# Patient Record
Sex: Female | Born: 1995 | Race: Black or African American | Hispanic: No | Marital: Single
Health system: Midwestern US, Community
[De-identification: ages and names within clinical notes are randomized; demographics above are authoritative.]

## PROBLEM LIST (undated history)

## (undated) DIAGNOSIS — D509 Iron deficiency anemia, unspecified: Secondary | ICD-10-CM

## (undated) DIAGNOSIS — R7989 Other specified abnormal findings of blood chemistry: Secondary | ICD-10-CM

## (undated) DIAGNOSIS — Z789 Other specified health status: Secondary | ICD-10-CM

## (undated) DIAGNOSIS — F419 Anxiety disorder, unspecified: Secondary | ICD-10-CM

## (undated) DIAGNOSIS — F32A Depression, unspecified: Secondary | ICD-10-CM

## (undated) DIAGNOSIS — F6089 Other specific personality disorders: Secondary | ICD-10-CM

## (undated) HISTORY — DX: Other specified health status: Z78.9

## (undated) HISTORY — DX: Other specific personality disorders: F60.89

---

## 2015-06-28 DIAGNOSIS — F32A Depression, unspecified: Secondary | ICD-10-CM | POA: Insufficient documentation

## 2017-02-16 ENCOUNTER — Ambulatory Visit: Admit: 2017-02-16 | Discharge: 2017-02-16 | Payer: MEDICARE | Attending: Nurse Practitioner | Primary: Family Medicine

## 2017-02-16 DIAGNOSIS — K219 Gastro-esophageal reflux disease without esophagitis: Secondary | ICD-10-CM

## 2017-02-16 NOTE — Patient Instructions (Addendum)
Learning About Cutting Calories  How do calories affect your weight?  Food gives your body energy. Energy from the food you eat is measured in calories. This energy keeps your heart beating, your brain active, and your muscles working.  Your body needs a certain number of calories each day. After your body uses the calories it needs, it stores extra calories as fat.  To lose weight safely, you have to eat fewer calories while eating in a healthy way.  How many calories do you need each day?  The more active you are, the more calories you need. When you are less active, you need fewer calories. How many calories you need each day also depends on several things, including your age and whether you are female or female.  Here are some general guidelines for adults:  ?? Less active women and older adults need 1,600 to 2,000 calories each day.  ?? Active women and less active men need 2,000 to 2,400 calories each day.  ?? Active men need 2,400 to 3,000 calories each day.  How can you cut calories and eat healthy meals?  Whole grains, vegetables and fruits, and dried beans are good lower-calorie foods. They give you lots of nutrients and fiber. And they fill you up.  Sweets, energy drinks, and soda pop are high in calories. They give you few nutrients and no fiber. Try to limit soda pop, fruit juice, and energy drinks. Drink water instead.  Some fats can be part of a healthy diet. But cutting back on fats from highly processed foods like fast foods and many snack foods is a good way to lower the calories in your diet. Also, use smaller amounts of fats like butter, margarine, salad dressing, and mayonnaise. Add fresh garlic, lemon, or herbs to your meals to add flavor without adding fat.  Meats and dairy products can be a big source of hidden fats. Try to choose lean or low-fat versions of these products.  Fat-free cookies, candies, chips, and frozen treats can still be high in  sugar and calories. Some fat-free foods have more calories than regular ones. Eat fat-free treats in moderation, as you would other foods.  If your favorite foods are high in fat, salt, sugar, or calories, limit how often you eat them. Eat smaller servings, or look for healthy substitutes. Fill up on fruits, vegetables, and whole grains.  Eating at home  ?? Use meat as a side dish instead of as the main part of your meal.  ?? Try main dishes that use whole wheat pasta, brown rice, dried beans, or vegetables.  ?? Find ways to cook with little or no fat, such as broiling, steaming, or grilling.  ?? Use cooking spray instead of oil. If you use oil, use a monounsaturated oil, such as canola or olive oil.  ?? Trim fat from meats before you cook them.  ?? Drain off fat after you brown the meat or while you roast it.  ?? Chill soups and stews after you cook them. Then skim the fat off the top after it hardens.  Eating out  ?? Order foods that are broiled or poached rather than fried or breaded.  ?? Cut back on the amount of butter or margarine that you use on bread.  ?? Order sauces, gravies, and salad dressings on the side, and use only a little.  ?? When you order pasta, choose tomato sauce rather than cream sauce.  ?? Ask for salsa with your baked   potato instead of sour cream, butter, cheese, or bacon.  ?? Order meals in a small size instead of upgrading to a large.  ?? Share an entree, or take part of your food home to eat as another meal.  ?? Share appetizers and desserts.  Where can you learn more?  Go to http://www.healthwise.net/[facility]Help[employer]ctions.  Enter V914 in the search box to learn more about "Learning About Cutting Calories."  Current as of: June 15, 2016  Content Version: 11.8  ?? 2006-2018 [employer]. Care instructions adapted under license by [employer] (which disclaims liability or warranty for this information). If you have questions about a medical condition or  this instruction, always ask your healthcare professional. Healthwise, Incorporated disclaims any warranty or liability for your use of this information.

## 2017-02-16 NOTE — Progress Notes (Signed)
OFFICE NOTE    Cathy Contreras is a 21 y.o. female presenting today for office visit.   02/16/2017  4:58 PM    Chief Complaint   Patient presents with   ??? GERD     diagnosed 5 years ago   ??? Irregular Menses     HPI: Here today as a new patient, establishing care in office. Used to follow with Dr Lequita Halt but also with [facility]. She denies any recent labs. Has seen GYN in the past.    GERD: Diagnosed about 5 years ago. Uses OTC medications at times. Seems to get worse with weight gain. Has been changing diet including no fried foods. Does not seem to be improving much despite dietary changes.     Complains of heavy periods that have been happening for about 3 years since being off DepoProvera. She reports that before the Depo, she had heavy cycles but they were regular. Now, the last three years, they have been irregular and heavy. She reports that typically she has about two periods per month. She states that she will have light cycle initially and then heavy for about a week, then she will have about two weeks off before another one starts. Last month, she had three periods in one month. States that she saw GYN, Dr Emogene Morgan, in the past and told that she had ovarian cysts but told that there was nothing to be done. Last PAP, per patient, this year.     Would like to discuss weight management. Reports that she is eating about 1500 calories per day that she knows of when she was last counting- has not been counting lately because she feels that she eats healthy. Has been eating more vegetables and has cut out fried foods. She was going to the gym about three times per week but stopped a few weeks ago. She reports that no matter what she does, she can't seem to lose weight but does report weight loss in the past with going to the gym. She reports weight about 170 lbs when she was 21 years old and earlier this year was 230 lbs- now 270 lbs.     Review of Systems    Constitutional: Positive for unexpected weight change. Negative for chills, fatigue and fever.   Respiratory: Negative for cough, shortness of breath and wheezing.    Cardiovascular: Negative for chest pain, palpitations and leg swelling.   Gastrointestinal: Negative for abdominal pain, constipation, diarrhea, nausea and vomiting.        +indigestion   Genitourinary: Positive for menstrual problem. Negative for difficulty urinating, frequency, pelvic pain and vaginal discharge.   Musculoskeletal: Negative for arthralgias and myalgias.   Skin: Negative for rash.   Neurological: Negative for dizziness and headaches.       PHQ Screening   PHQ over the last two weeks 02/16/2017   Little interest or pleasure in doing things Not at all   Feeling down, depressed, irritable, or hopeless Not at all   Total Score PHQ 2 0       History  Past Medical History:   Diagnosis Date   ??? GERD (gastroesophageal reflux disease)        History reviewed. No pertinent surgical history.    Social History     Socioeconomic History   ??? Marital status: SINGLE     Spouse name: Not on file   ??? Number of children: Not on file   ??? Years of education: Not on file   ???  Highest education level: Not on file   Social Needs   ??? Financial resource strain: Not on file   ??? Food insecurity - worry: Not on file   ??? Food insecurity - inability: Not on file   ??? Transportation needs - medical: Not on file   ??? Transportation needs - non-medical: Not on file   Occupational History   ??? Not on file   Tobacco Use   ??? Smoking status: Never Smoker   ??? Smokeless tobacco: Never Used   Substance and Sexual Activity   ??? Alcohol use: Yes     Frequency: Monthly or less     Comment: occasional   ??? Drug use: No   ??? Sexual activity: Yes     Partners: Male     Birth control/protection: None   Other Topics Concern   ??? Not on file   Social History Narrative   ??? Not on file       Family History   Problem Relation Age of Onset   ??? Diabetes Mother    ??? Hypertension Father     ??? Cancer Paternal Grandmother [id number]        Colon cancer       No Known Allergies      No current daily medications      Advance Care Planning:   Patient was offered the opportunity to discuss advance care planning NO   Does patient have an Advance Directive:     If no, did you provide information on [facility]?         Patient Care Team:  Patient Care Team:  Birder RobsonLyle, Smriti Barkow D, NP as PCP - General (Nurse Practitioner)        LABS:  None new to review    RADIOLOGY:  None new to review      Physical Exam   Constitutional: She is oriented to person, place, and time. She appears well-developed and well-nourished. No distress.   Neck: Normal range of motion. Neck supple. No thyromegaly present.   Cardiovascular: Normal rate, regular rhythm and normal heart sounds.   No murmur heard.  Pulmonary/Chest: Effort normal and breath sounds normal. No respiratory distress.   Abdominal: Soft. Bowel sounds are normal. There is no tenderness.   Musculoskeletal: She exhibits no edema.   Neurological: She is alert and oriented to person, place, and time. She exhibits normal muscle tone. Coordination and gait normal.   Skin: Skin is warm and dry.         Vitals:    02/16/17 1650   BP: 124/69   Pulse: 78   Resp: 16   Temp: 97.8 ??F (36.6 ??C)   TempSrc: Oral   SpO2: 96%   Weight: 277 lb (125.6 kg)   Height: 5\' 8"  (1.727 m)   PainSc:   0 - No pain         Assessment and Plan    Gastroesophageal reflux disease, esophagitis presence not specified  *Did not get a chance to fully discuss today as conversation went more toward cycles and weight gain.     Menorrhagia with irregular cycle  *Check labs. Will attempt to get GYN records to determine what work up has been completed. Consider transvaginal US as well.   - CBC W/O DIFF; Future  - METABOLIC PANEL, COMPREHENSIVE; Future  - LIPID PANEL; Future  - TSH 3RD GENERATION; Future  - URINALYSIS W/MICROSCOPIC; Future     Weight gain/ Morbid obesity (HCC)   *Discussed  with patient about weight loss goals and tentative approach to goals discussed. Advised on calorie counting, reduction in calories, and how to calculate BMR. Recommend dietician consultation to promote healthy eating and get a dietary plan in place. Exercise encouraged- start with about 30 mins three days a week and then increase up over time- include both cardio and strength training. Consider formal program for exercise if budget permits. Recommend 5-10% weight loss prior to any initiation of pharmacological management. Follow up in office for weigh ins occasionally for accountability and when short term weight loss goal of 5-10% is achieved.   *I have reviewed/discussed the above normal BMI with the patient.  I have recommended the following interventions: dietary management education, guidance, and counseling, encourage exercise and monitor weight .   - CBC W/O DIFF; Future  - METABOLIC PANEL, COMPREHENSIVE; Future  - LIPID PANEL; Future  - TSH 3RD GENERATION; Future  - URINALYSIS W/MICROSCOPIC; Future    Encounter for immunization  *Given today in office. See LPN documentation.   - ADMIN INFLUENZA VIRUS VAC  - INFLUENZA VIRUS VAC QUAD,SPLIT,PRESV FREE SYRINGE IM          *Plan of care reviewed with patient. Patient in agreement with plan and expresses understanding. All questions answered and patient encouraged to call or RTO if further questions or concerns.       Follow-up Disposition:  Return in about 1 month (around 03/18/2017) for weight management and menorrhagia follow up- 30 mins..Marland Kitchen

## 2017-02-16 NOTE — Progress Notes (Signed)
Cathy Contreras is a 21 y.o. female who presents for routine immunizations.   She denies any symptoms , reactions or allergies that would exclude them from being immunized today.  Risks and adverse reactions were discussed and the VIS was given to them. All questions were addressed.  She was observed for 15 min post injection. There were no reactions observed.    Anda Kraftocelsey L Sanjit Mcmichael, LPN

## 2017-02-23 ENCOUNTER — Other Ambulatory Visit: Admit: 2017-02-23 | Payer: MEDICARE | Attending: Nurse Practitioner | Primary: Family Medicine

## 2017-02-23 DIAGNOSIS — N921 Excessive and frequent menstruation with irregular cycle: Secondary | ICD-10-CM

## 2017-02-23 DIAGNOSIS — Z0189 Encounter for other specified special examinations: Secondary | ICD-10-CM

## 2017-02-23 NOTE — Progress Notes (Signed)
Patient presents for lab draw ordered by:    Ordering Provider:  Mauri BrooklynJerrica Lyle NP   Ordering Department/Practice:  [facility]  Phone:  [phone]  Date Ordered:  02/16/2017    The following labs were drawn and [facility] by Anda Kraftocelsey L Anik Wesch, LPN:    CBC, Lipid Profile, CMP and TSH, 3rd Generation    The following tubes were sent:    Lavender  ( 1)    Gel  1    Pt in for lab visit. Venipuncture done in left arm. Unable to obtain blood specimen. Venipuncture done in right arn, blood specimen obtained. Pt tolerated well. No comps. Pt unable to void at this time to obtain urine specimen.

## 2017-02-24 ENCOUNTER — Inpatient Hospital Stay: Admit: 2017-02-24 | Payer: MEDICARE | Primary: Family Medicine

## 2017-02-24 LAB — CBC W/O DIFF
HCT: 39.7 % (ref 35.0–45.0)
HGB: 11.8 g/dL — ABNORMAL LOW (ref 12.0–16.0)
MCH: 24.5 PG (ref 24.0–34.0)
MCHC: 29.7 g/dL — ABNORMAL LOW (ref 31.0–37.0)
MCV: 82.4 FL (ref 74.0–97.0)
MPV: 11 FL (ref 9.2–11.8)
PLATELET: 362 10*3/uL (ref 135–420)
RBC: 4.82 M/uL (ref 4.20–5.30)
RDW: 16.1 % — ABNORMAL HIGH (ref 11.6–14.5)
WBC: 6.5 10*3/uL (ref 4.6–13.2)

## 2017-02-24 LAB — METABOLIC PANEL, COMPREHENSIVE
A-G Ratio: 0.9 (ref 0.8–1.7)
ALT (SGPT): 41 U/L (ref 13–56)
AST (SGOT): 30 U/L (ref 15–37)
Albumin: 3.7 g/dL (ref 3.4–5.0)
Alk. phosphatase: 149 U/L — ABNORMAL HIGH (ref 45–117)
Anion gap: 8 mmol/L (ref 3.0–18)
BUN/Creatinine ratio: 10 — ABNORMAL LOW (ref 12–20)
BUN: 7 MG/DL (ref 7.0–18)
Bilirubin, total: 0.2 MG/DL (ref 0.2–1.0)
CO2: 23 mmol/L (ref 21–32)
Calcium: 9.5 MG/DL (ref 8.5–10.1)
Chloride: 105 mmol/L (ref 100–108)
Creatinine: 0.72 MG/DL (ref 0.6–1.3)
GFR est AA: >60 mL/min/{1.73_m2} (ref >60)
GFR est non-AA: >60 mL/min/{1.73_m2} (ref >60)
Globulin: 3.9 g/dL (ref 2.0–4.0)
Glucose: 99 mg/dL (ref 74–99)
Potassium: 4.2 mmol/L (ref 3.5–5.5)
Protein, total: 7.6 g/dL (ref 6.4–8.2)
Sodium: 136 mmol/L (ref 136–145)

## 2017-02-24 LAB — LIPID PANEL
CHOL/HDL Ratio: 4.9 (ref 0–5.0)
Cholesterol, total: 157 MG/DL (ref <200)
HDL Cholesterol: 32 MG/DL — ABNORMAL LOW (ref 40–60)
LDL, calculated: 92 MG/DL (ref 0–100)
Triglyceride: 165 MG/DL — ABNORMAL HIGH (ref <150)
VLDL, calculated: 33 MG/DL

## 2017-02-24 LAB — TSH 3RD GENERATION: TSH: 4.7 u[IU]/mL — ABNORMAL HIGH (ref 0.36–3.74)

## 2017-02-27 ENCOUNTER — Encounter

## 2017-02-27 NOTE — Telephone Encounter (Signed)
Return call made x's 2 to [facility] to speak to [place]David. He was out of the office and now gone for today. Will call back tomorrow.

## 2017-02-27 NOTE — Progress Notes (Signed)
02/27/2017  11:41 AM      Noted elevated TSH. Add on Free T4 ordered at this time- [facility] at [phone]

## 2017-02-27 NOTE — Telephone Encounter (Signed)
[facility] called [facility] this patient stating the 3T4 had a 48 stability, could not be processed

## 2017-02-28 NOTE — Telephone Encounter (Signed)
Call made to Onalee Huaavid at [facility] to get clarification on message regarding the pt's labs. Not able to add the test Free T 4, Lab not stable after 48 hrs. Will make the provider aware.

## 2017-03-01 NOTE — Telephone Encounter (Signed)
03/01/2017  8:43 AM    Chief Complaint   Patient presents with   ??? Abnormal Lab Results       Noted that unable to run Free T4 off last labs. Will discuss further with patient about doing before or at next visit.

## 2017-03-01 NOTE — Telephone Encounter (Signed)
03/01/2017  9:36 AM    Chief Complaint   Patient presents with   ??? Results     lab results, elevated TSH and mild anemia       Noted lab results with mildly elevated TSH and mild anemia. Attempted to add on T4 but no longer stable. MyCHart message sent to patient with results, as well as request to do further labs before or at next visit.       Results for orders placed or performed during the hospital encounter of 02/23/17   CBC W/O DIFF   Result Value Ref Range    WBC 6.5 4.6 - 13.2 K/uL    RBC 4.82 4.20 - 5.30 M/uL    HGB 11.8 (L) 12.0 - 16.0 g/dL    HCT 39.7 35.0 - 45.0 %    MCV 82.4 74.0 - 97.0 FL    MCH 24.5 24.0 - 34.0 PG    MCHC 29.7 (L) 31.0 - 37.0 g/dL    RDW 16.1 (H) 11.6 - 14.5 %    PLATELET 362 135 - 420 K/uL    MPV 11.0 9.2 - 29.5 FL   METABOLIC PANEL, COMPREHENSIVE   Result Value Ref Range    Sodium 136 136 - 145 mmol/L    Potassium 4.2 3.5 - 5.5 mmol/L    Chloride 105 100 - 108 mmol/L    CO2 23 21 - 32 mmol/L    Anion gap 8 3.0 - 18 mmol/L    Glucose 99 74 - 99 mg/dL    BUN 7 7.0 - 18 MG/DL    Creatinine 0.72 0.6 - 1.3 MG/DL    BUN/Creatinine ratio 10 (L) 12 - 20      GFR est AA >60 >60 ml/min/1.65m    GFR est non-AA >60 >60 ml/min/1.770m   Calcium 9.5 8.5 - 10.1 MG/DL    Bilirubin, total 0.2 0.2 - 1.0 MG/DL    ALT (SGPT) 41 13 - 56 U/L    AST (SGOT) 30 15 - 37 U/L    Alk. phosphatase 149 (H) 45 - 117 U/L    Protein, total 7.6 6.4 - 8.2 g/dL    Albumin 3.7 3.4 - 5.0 g/dL    Globulin 3.9 2.0 - 4.0 g/dL    A-G Ratio 0.9 0.8 - 1.7     LIPID PANEL   Result Value Ref Range    LIPID PROFILE          Cholesterol, total 157 <200 MG/DL    Triglyceride 165 (H) <150 MG/DL    HDL Cholesterol 32 (L) 40 - 60 MG/DL    LDL, calculated 92 0 - 100 MG/DL    VLDL, calculated 33 MG/DL    CHOL/HDL Ratio 4.9 0 - 5.0     TSH 3RD GENERATION   Result Value Ref Range    TSH 4.70 (H) 0.36 - 3.74 uIU/mL

## 2017-03-16 ENCOUNTER — Ambulatory Visit: Admit: 2017-03-16 | Discharge: 2017-03-16 | Payer: MEDICARE | Attending: Nurse Practitioner | Primary: Family Medicine

## 2017-03-16 DIAGNOSIS — N921 Excessive and frequent menstruation with irregular cycle: Secondary | ICD-10-CM

## 2017-03-16 NOTE — Progress Notes (Signed)
03/16/2017  5:10 PM      This is an Initial Medicare Annual Wellness Exam (AWV) (Performed 12 months after IPPE or effective date of Medicare Part B enrollment, Once in a lifetime)    I have reviewed the patient's medical history in detail and updated the computerized patient record.     History     Past Medical History:   Diagnosis Date   ??? GERD (gastroesophageal reflux disease)       History reviewed. No pertinent surgical history.    No Known Allergies  Family History   Problem Relation Age of Onset   ??? Diabetes Mother    ??? Hypertension Father    ??? Cancer Paternal Grandmother [id number]        Colon cancer     Social History     Tobacco Use   ??? Smoking status: Never Smoker   ??? Smokeless tobacco: Never Used   Substance Use Topics   ??? Alcohol use: Yes     Frequency: Monthly or less     Comment: occasional     Patient Active Problem List   Diagnosis Code   ??? Obesity, morbid (HCC) E66.01         Depression Risk Factor Screening:     PHQ over the last two weeks 02/16/2017   Little interest or pleasure in doing things Not at all   Feeling down, depressed, irritable, or hopeless Not at all   Total Score PHQ 2 0       Alcohol Risk Factor Screening:   You do not drink alcohol or very rarely.    Functional Ability and Level of Safety:     Hearing Loss   No hearing loss noted    Activities of Daily Living   Self-care.   Requires assistance with: no ADLs    Fall Risk   No flowsheet data found.    Abuse Screen   Patient is not abused  Denies financial, physical, or verbal abuse    Review of Systems   Constitutional: negative for fevers, chills and weight loss  Respiratory: negative for cough, wheezing or dyspnea on exertion  Cardiovascular: negative for chest pain, palpitations  Gastrointestinal: negative for abdominal pain, N/V/D  Musculoskeletal:negative for myalgias and muscle weakness    Labwork/Imaging     Lab Results   Component Value Date/Time    Glucose 99 02/23/2017 09:32 AM       Lab Results   Component Value Date/Time     Cholesterol, total 157 02/23/2017 09:32 AM    HDL Cholesterol 32 (L) 02/23/2017 09:32 AM    LDL, calculated 92 02/23/2017 09:32 AM    VLDL, calculated 33 02/23/2017 09:32 AM    Triglyceride 165 (H) 02/23/2017 09:32 AM    CHOL/HDL Ratio 4.9 02/23/2017 09:32 AM         Physical Examination     Evaluation of Cognitive Function:  Mood/affect:  neutral  Appearance: age appropriate, casually dressed and within normal Limits  Family member/caregiver input: none present    Blood pressure 133/84, pulse 75, temperature 97.5 ??F (36.4 ??C), temperature source Oral, resp. rate 16, height 5\' 8"  (1.727 m), weight 282 lb (127.9 kg), SpO2 96 %.  Body mass index is 42.88 kg/m??.    General appearance: alert, cooperative, no distress  Lungs: clear to auscultation bilaterally  Heart: regular rate and rhythm, S1, S2 normal, no murmur, click, rub or gallop  Abdomen: soft, non-tender. Bowel sounds normal. No masses,  no organomegaly  Extremities: extremities normal, atraumatic, no cyanosis or edema    Patient Care Team:  Birder RobsonLyle, Theressa Piedra D, NP as PCP - General (Nurse Practitioner)    Advice/Referrals/Counseling   Education and counseling provided:  Are appropriate based on today's review and evaluation  Screening Pap and pelvic (covered once every 2 years)  Cardiovascular screening blood test  Diabetes screening test    Assessment/Plan   Diagnoses and all orders for this visit:    Encounter for Medicare annual wellness exam  *Medicare wellness completed. Education and counseling provided, recommendations made- see Medicare chart in patient instructions and see below.   -Screening for depression  -Screening for alcoholism    Obesity, morbid (HCC)  *I have reviewed/discussed the above normal BMI with the patient.  I have recommended the following interventions: dietary management education, guidance, and counseling, encourage exercise and monitor weight .     Healthcare maintenance   *Will attempt to get last PAP records from GYN. Diabetes and cholesterol screening completed recently. Flu shot given this year.       *Plan of care reviewed with patient. Patient in agreement with plan and expresses understanding. All questions answered and patient encouraged to call or RTO if further questions or concerns.       Follow-up Disposition:  Return in about 2 months (around 05/17/2017) for weight management follow up- 30 mins.Edd Arbour, Yearly Medicare Wellness- [phone] done today.Marland Kitchen..Marland Kitchen

## 2017-03-16 NOTE — Progress Notes (Signed)
OFFICE NOTE    Cathy Contreras is a 21 y.o. female presenting today for office visit.   03/16/2017   5:00 PM      Chief Complaint   Patient presents with   ??? Weight Management   ??? Menstrual Problem     HPI: Here today for follow up on complaints and weight management, new patient at last visit. Medicare Wellness due (see other note). Labs done recently and here to review as well. Used to follow with Dr Lilia Pro but also with [facility]- no records available at this time. Has seen GYN in the past- no records available at this time.    At last visit, heavy cycles were discussed. Complained of heavy periods that have been happening for about 3 years since being off DepoProvera. She reports that before the Depo, she had heavy cycles but they were regular. Now, the last three years, they have been irregular and heavy. She reports that typically she has about two periods per month. She states that she will have light cycle initially and then heavy for about a week, then she will have about two weeks off before another one starts. Last month, she had three periods in one month. States that she saw GYN, Dr Aviva Signs, in the past and told that she had ovarian cysts but told that there was nothing to be done.     Also discussed weight management at last visit. She has gained weight since last seen about a month ago- she attributes to the holidays. Plans on getting back to making lifestyle changes with the New Year and she got a treadmill recently. Previously, she reported eating about 1500 calories per day when counting but admitted that she did not count often because she felt that she was eating healthy. Had been going to the gym but stopped. 170 lbs when she was 21 years old and earlier this year was 230 lbs- now 270/280 lbs. She reports she just feels tired all the time.     Review of Systems   Constitutional: Positive for fatigue. Negative for chills and fever.    Gastrointestinal: Negative for abdominal pain, blood in stool, constipation, diarrhea, nausea and vomiting.   Genitourinary: Positive for menstrual problem. Negative for dysuria, pelvic pain, vaginal discharge and vaginal pain.       PHQ Screening   PHQ over the last two weeks 02/16/2017   Little interest or pleasure in doing things Not at all   Feeling down, depressed, irritable, or hopeless Not at all   Total Score PHQ 2 0       History  Past Medical History:   Diagnosis Date   ??? GERD (gastroesophageal reflux disease)        History reviewed. No pertinent surgical history.    Social History     Socioeconomic History   ??? Marital status: SINGLE     Spouse name: Not on file   ??? Number of children: Not on file   ??? Years of education: Not on file   ??? Highest education level: Not on file   Social Needs   ??? Financial resource strain: Not on file   ??? Food insecurity - worry: Not on file   ??? Food insecurity - inability: Not on file   ??? Transportation needs - medical: Not on file   ??? Transportation needs - non-medical: Not on file   Occupational History   ??? Not on file   Tobacco Use   ??? Smoking  status: Never Smoker   ??? Smokeless tobacco: Never Used   Substance and Sexual Activity   ??? Alcohol use: Yes     Frequency: Monthly or less     Comment: occasional   ??? Drug use: No   ??? Sexual activity: Yes     Partners: Male     Birth control/protection: None   Other Topics Concern   ??? Not on file   Social History Narrative   ??? Not on file       Family History   Problem Relation Age of Onset   ??? Diabetes Mother    ??? Hypertension Father    ??? Cancer Paternal Grandmother 58        Colon cancer       No Known Allergies      No current daily medications      Advance Care Planning:   Patient was offered the opportunity to discuss advance care planning NO   Does patient have an Advance Directive:     If no, did you provide information on Caring Connections?         Patient Care Team:  Patient Care Team:   Juliann Pares, NP as PCP - General (Nurse Practitioner)        LABS:    Results for orders placed or performed during the hospital encounter of 02/23/17   CBC W/O DIFF   Result Value Ref Range    WBC 6.5 4.6 - 13.2 K/uL    RBC 4.82 4.20 - 5.30 M/uL    HGB 11.8 (L) 12.0 - 16.0 g/dL    HCT 39.7 35.0 - 45.0 %    MCV 82.4 74.0 - 97.0 FL    MCH 24.5 24.0 - 34.0 PG    MCHC 29.7 (L) 31.0 - 37.0 g/dL    RDW 16.1 (H) 11.6 - 14.5 %    PLATELET 362 135 - 420 K/uL    MPV 11.0 9.2 - 63.0 FL   METABOLIC PANEL, COMPREHENSIVE   Result Value Ref Range    Sodium 136 136 - 145 mmol/L    Potassium 4.2 3.5 - 5.5 mmol/L    Chloride 105 100 - 108 mmol/L    CO2 23 21 - 32 mmol/L    Anion gap 8 3.0 - 18 mmol/L    Glucose 99 74 - 99 mg/dL    BUN 7 7.0 - 18 MG/DL    Creatinine 0.72 0.6 - 1.3 MG/DL    BUN/Creatinine ratio 10 (L) 12 - 20      GFR est AA >60 >60 ml/min/1.4m    GFR est non-AA >60 >60 ml/min/1.771m   Calcium 9.5 8.5 - 10.1 MG/DL    Bilirubin, total 0.2 0.2 - 1.0 MG/DL    ALT (SGPT) 41 13 - 56 U/L    AST (SGOT) 30 15 - 37 U/L    Alk. phosphatase 149 (H) 45 - 117 U/L    Protein, total 7.6 6.4 - 8.2 g/dL    Albumin 3.7 3.4 - 5.0 g/dL    Globulin 3.9 2.0 - 4.0 g/dL    A-G Ratio 0.9 0.8 - 1.7     LIPID PANEL   Result Value Ref Range    LIPID PROFILE          Cholesterol, total 157 <200 MG/DL    Triglyceride 165 (H) <150 MG/DL    HDL Cholesterol 32 (L) 40 - 60 MG/DL    LDL, calculated 92 0 - 100  MG/DL    VLDL, calculated 33 MG/DL    CHOL/HDL Ratio 4.9 0 - 5.0     TSH 3RD GENERATION   Result Value Ref Range    TSH 4.70 (H) 0.36 - 3.74 uIU/mL       RADIOLOGY:  None new to review      Physical Exam   Constitutional: She is oriented to person, place, and time. She appears well-developed and well-nourished. No distress.   Pulmonary/Chest: Effort normal. No respiratory distress.   Neurological: She is alert and oriented to person, place, and time. She exhibits normal muscle tone. Coordination normal.   Skin: Skin is warm and dry.    Psychiatric: Her speech is normal and behavior is normal. Her mood appears not anxious. She does not exhibit a depressed mood.         Vitals:    03/16/17 1653   BP: 133/84   Pulse: 75   Resp: 16   Temp: 97.5 ??F (36.4 ??C)   TempSrc: Oral   SpO2: 96%   Weight: 282 lb (127.9 kg)   Height: 5' 8"  (1.727 m)   PainSc:   0 - No pain       Wt Readings from Last 3 Encounters:   03/16/17 282 lb (127.9 kg)   02/16/17 277 lb (125.6 kg)       Assessment and Plan    Menorrhagia with irregular cycle  *No office notes received from GYN- will attempt again to determine past work up. Also advised patient that a follow up with their office may be of benefit. Mild anemia noted of Hgb 11.8- will check iron levels and vitamin levels. Consider iron supplement. Advised on OCP or other management and she declines at this time. She also declines further work up such as transvaginal US at this time. She reports that overall, it is not that bothersome.     Anemia, unspecified type  *Check further labs  - IRON PROFILE; Future  - FERRITIN; Future  - FOLATE; Future  - VITAMIN B12; Future  - CBC W/O DIFF; Future    Obesity, morbid ([place])  *She has not been working on lifestyle changes. Attempted and encouraged motivation to do so and promote weight loss.    Abnormal serum thyroid stimulating hormone (TSH) level  *Check further, noted on labs.  - TSH 3RD GENERATION; Future  - T4, FREE; Future    Fatigue, unspecified type/ Other general symptoms and signs/ Vitamin D deficiency  *Check further labs. Consider Vit D deficiency.   - IRON PROFILE; Future  - FERRITIN; Future  - FOLATE; Future  - VITAMIN B12; Future  - TSH 3RD GENERATION; Future  - T4, FREE; Future  - VITAMIN D, 1, 25 DIHYDROXY; Future  - CBC W/O DIFF; Future      *Plan of care reviewed with patient. Patient in agreement with plan and expresses understanding. All questions answered and patient encouraged to call or RTO if further questions or concerns.       Follow-up Disposition:   Return in about 2 months (around 05/17/2017) for weight management follow up- 30 mins.Freda Munro Medicare Wellness- [phone] done today.Marland Kitchen

## 2017-03-16 NOTE — Patient Instructions (Addendum)
Preventive Services Limitations Recommendation Preventative Services Limitations Recommendation   Glaucoma Screening (no USPSTF recommendation) Diabetes mellitus, family history, African American, age 23 or over, Hispanic American, age 23 or over Follows with eye doctor Periodontal Disease- Dentistry Services *May not be covered under Medicare Recommended   Skin Cancer Screening Exam every year  *May not be covered under Medicare   Self checks encouraged Hearing Impairment Pure Tone Test every 3 years  *May not be covered under Medicare No hearing loss noted   COPD and Lung Cancer Screening CT chest or chest xray yearly as deemed necessary  *May not be covered under Medicare Nonsmoker Counseling to Prevent Tobacco Use  - Counseling greater than 3 and up to 10 minutes  - Counseling greater than 10 minutes Patients must be asymptomatic of tobacco-related conditions to receive as preventive service    Up to 8 sessions/year Nonsmoker   Alcohol Misuse Counseling 4 brief face to face counseling sessions/year Not indicated Obesity Screening and Counseling BMI of 30 or more. Weekly visits for first month, then biweekly for months 2-6, then every month for months 7-12 if you lose 6.6 lbs in months 1-6 Body mass index is 42.88 kg/m??.     Screening for STIs and Counseling Annually screenings- 2 face to face counseling sessions/year Follows with GYN    Can be done in future if desired Human Immunodeficiency Virus (HIV) Screening (annually for increased risk patients)  HIV-1 and HIV-2 by EIA, ELISA, rapid antibody test, or oral mucosa transudate Tests covered annually for patients at increased risk.  Pregnant patients may receive up to 3 test during pregnancy. Follows with GYN    Can be done in future if desired   Diabetes Screening Tests (at least every 3 years, Medicare covers annually or at 5015-month intervals for prediabetic patients)    Fasting blood sugar (FBS) or glucose tolerance test (GTT) Diagnosed with  one of the following:  -Hypertension, Dyslipidemia, obesity, previous impaired FBS or GTT  ???Or any two of the following: overweight, FH of diabetes, age ?65, history of gestational diabetes, birth of baby weighing more than 9 pounds Glu 99 02/2017 Cardiovascular Screening Blood Tests (every 5 years)  Total cholesterol, HDL, Triglycerides Order as a panel if possible LDL 92 02/2017    Controlled   Medical Nutrition Therapy (MNT) (for diabetes or renal disease not recommended schedule) Requires referral by treating physician for patient with diabetes or renal disease. Up to 3 hours for initial year and 2 hours in subsequent years. Not indicate Diabetes Self-Management Training (DSMT) (no USPSTF recommendation) Requires referral by treating physician for patient with diabetes or renal disease. 10 hours of initial DSMT session of no less than 30 minutes each in a continuous 533-month period.  2 hours of follow-up DSMT in subsequent years. Not indicated   Behavioral Therapy for Cardiovascular Disease Annually Not indicated due to age Ultrasound Screening for Abdominal Aortic Aneurysm (AAA) (once) Patient must be referred through IPPE. Criteria:  - Men who are 7765-10896 years old and smoked >100 cigarettes.  -Anyone with FH of AAA  -Or recommended for screening by USPSTF N/A   Prostate Cancer Screening (annually up to age 23)  - Digital rectal exam (DRE)  - Prostate specific antigen (PSA) Annually (age 23 or over), DRE not paid separately when covered E/M service is provided on same date N/A Colorectal Cancer Screening -Fecal occult blood test (annual)  -Flexible sigmoidoscopy (5y)  -Screening colonoscopy (10y)  -Barium Enema Not indicated for early screening  Bone Mass Measurement  (age 23 & older, biennial) Requires diagnosis related to osteoporosis or estrogen deficiency. History of long-term glucocorticoid tx or baseline is needed. Not indicated for early screening Screening Mammography (biennial  age 23-74) Annually (age 23 or over) Not indicated for early screening   Screening Pap Tests and Pelvic Examination (up to age 23 and after 6370 if unknown history or abnormal study last 10 years) Every 24 months except high risk Recommend to follow up with GYN Hepatitis B Vaccinations (if medium/high risk) Medium/high risk factors:  End-stage renal disease,  Hemophiliacs who received Factor VIII or IX concentrates, Clients of institutions for the mentally retarded, Persons who live in the same house as a HepB virus carrier, Homosexual men, Illicit injectable drug abusers. Not indicated   Seasonal Influenza Vaccination (annually)  Given this year Pneumococcal Vaccination (once after 65)  Not indicated for early vaccination   Herpes Zoster (Shingles) Vaccination (once after age 23) Administer to persons at age 23 years in specific conditions  *May not be covered by Medicare Not indicated for early vaccination Tetanus Vaccine Td booster every 10 years- Tdap if exposed to children under 1 year)  *May not be covered by Medicare   No medical indication today    Unsure of last booster       Dental    [facility]- also has several [place] locations  [address], [place], [place]  Phone: [phone]    [facility]  [address], [place], [place] [id number]  Phone: [phone]    [facility]- also has [place], and [place] locations  [address] [place], [place]  Phone: [phone]    [facility]  [address], [place], [place]  Phone: [phone]    [facility]  [address] [place], [place]  Phone: [phone]    [facility]- also has [facility] [place] [facility]  [address] [place], [place], [place]  Phone: [phone]  &  [address], [address], [place], [place]  Phone: [phone]    [facility]   [address], [place], [place] [address]  [phone]    [facility]  [address], [place], [place] [address]  [phone]    Leanne ChangAlan Taylor D.D.Kathie RhodesS  [address], [place], [place] [address]  [phone]    Dr. Chuck HintJana Boyd, DDS  [address], [place], [place] [address]  [phone]    Dr. Kandace Parkinsonovan Caves  [address], [place], [place] [address]  [phone]    [facility]  [address], [place], [place] [address]  [phone]    Here are some dental clinics in the area that offer discounted care:    [facility]  [address] [place], [place]  Phone: [phone]    [facility]  [address] [place], [place]  Phone: [phone]    [facility]  [address] [place], [place]  Phone: [phone]    [facility]  [address], [place], [place]  Phone: [phone]    [facility]  [address], [place], [place]  Phone: [phone]    `

## 2017-03-22 ENCOUNTER — Encounter: Admit: 2017-03-22 | Payer: MEDICARE | Attending: Nurse Practitioner | Primary: Family Medicine

## 2017-03-23 ENCOUNTER — Encounter: Attending: Nurse Practitioner | Primary: Family Medicine

## 2017-03-23 ENCOUNTER — Other Ambulatory Visit: Admit: 2017-03-23 | Discharge: 2017-03-23 | Payer: MEDICARE | Attending: Nurse Practitioner | Primary: Family Medicine

## 2017-03-23 DIAGNOSIS — Z0189 Encounter for other specified special examinations: Secondary | ICD-10-CM

## 2017-03-23 DIAGNOSIS — D649 Anemia, unspecified: Secondary | ICD-10-CM

## 2017-03-23 NOTE — Progress Notes (Signed)
Patient presents for lab draw ordered by:    Ordering Provider:  Mauri BrooklynJerrica Lyle NP  Ordering Department/Practice:  [facility]  Phone:  [phone]  Date Ordered:  03/16/2017    The following labs were drawn and [facility] to [facility] by Anda Kraftocelsey L Odeth Bry, LPN:   UJW,JXB,1YNCBC,TSH,3rd Generation,  , Iron Profile,Ferritin Folate,Vit B12,T4 Free,Vit D    The following tubes were sent:    Lavender  ( 1)     Gel   4    Pt in for lab visit. Venipuncture done in right arm x 2. Blood specimen obtained. Pt tolerated well. No comps.

## 2017-03-24 ENCOUNTER — Inpatient Hospital Stay: Admit: 2017-03-24 | Payer: MEDICARE | Primary: Family Medicine

## 2017-03-24 LAB — CBC W/O DIFF
HCT: 40.6 % (ref 35.0–45.0)
HGB: 12.6 g/dL (ref 12.0–16.0)
MCH: 25 PG (ref 24.0–34.0)
MCHC: 31 g/dL (ref 31.0–37.0)
MCV: 80.6 FL (ref 74.0–97.0)
MPV: 10.2 FL (ref 9.2–11.8)
PLATELET: 349 10*3/uL (ref 135–420)
RBC: 5.04 M/uL (ref 4.20–5.30)
RDW: 16.7 % — ABNORMAL HIGH (ref 11.6–14.5)
WBC: 6.5 10*3/uL (ref 4.6–13.2)

## 2017-03-24 LAB — VITAMIN B12: Vitamin B12: 774 pg/mL (ref 211–911)

## 2017-03-24 LAB — IRON PROFILE
Iron % saturation: 8 %
Iron: 34 ug/dL — ABNORMAL LOW (ref 50–175)
TIBC: 441 ug/dL (ref 250–450)

## 2017-03-24 LAB — FOLATE: Folate: >20 ng/mL — ABNORMAL HIGH (ref 3.10–17.50)

## 2017-03-24 LAB — T4, FREE: T4, Free: 1.1 NG/DL (ref 0.7–1.5)

## 2017-03-24 LAB — TSH 3RD GENERATION: TSH: 3 u[IU]/mL (ref 0.36–3.74)

## 2017-03-24 LAB — FERRITIN: Ferritin: 18 NG/ML (ref 8–388)

## 2017-03-26 LAB — VITAMIN D, 1, 25 DIHYDROXY: Calcitriol (Vit D 1, 25 di-OH): 37.2 pg/mL (ref 19.9–79.3)

## 2017-05-17 ENCOUNTER — Encounter: Attending: Nurse Practitioner | Primary: Family Medicine

## 2018-03-26 ENCOUNTER — Encounter: Attending: Family | Primary: Nurse Practitioner

## 2018-03-26 NOTE — Progress Notes (Deleted)
Last visit 02/2017- [id number] done then and addressed heavy cycle, weight management.

## 2018-08-23 NOTE — Telephone Encounter (Signed)
Patient called and advised she has been around someone that tested positive for COVID-19.  Patient advised she has no real symptoms other than a cough that is normal for her at this time of year.  Advised paitent to quarantine herself for 14 days and watch for any other symptoms such as fever, SOB, fatigue, dry cough etc- instructed if she notices any of these symptoms to notify us and we can refer her to the [facility].  Patient was concerned because she was around her 68 month old niece.  Advised her to talk to babies pediatrician for any concerns directed towards baby.  Patient verbalized understanding

## 2019-02-18 ENCOUNTER — Ambulatory Visit: Payer: MEDICAID | Attending: Nurse Practitioner | Primary: Nurse Practitioner

## 2019-07-02 ENCOUNTER — Ambulatory Visit: Attending: Nurse Practitioner | Primary: Family Medicine

## 2019-07-02 ENCOUNTER — Ambulatory Visit
Admit: 2019-07-02 | Discharge: 2019-07-02 | Payer: MEDICARE | Attending: Nurse Practitioner | Primary: Nurse Practitioner

## 2019-07-02 DIAGNOSIS — Z0000 Encounter for general adult medical examination without abnormal findings: Secondary | ICD-10-CM

## 2019-07-02 LAB — AMB POC URINE PREGNANCY TEST, VISUAL COLOR COMPARISON
HCG urine, Ql. (POC): POSITIVE
HCG, Pregnancy, Urine, POC: POSITIVE

## 2019-07-02 NOTE — ACP (Advance Care Planning) (Signed)
Advance Care Planning     Advance Care Planning (ACP) Physician/NP/PA Conversation      Date of Conversation: 07/02/2019  Conducted with: Patient with Decision Making Capacity    Healthcare Decision Maker:     Click here to complete HealthCare Decision Makers including selection of the Healthcare Decision Maker Relationship (ie "Primary")  Today we documented Decision Maker(s) consistent with Legal Next of Kin hierarchy.    Care Preferences:    Hospitalization: "If your health worsens and it becomes clear that your chance of recovery is unlikely, what would be your preference regarding hospitalization?"  The patient would prefer comfort-focused treatment without hospitalization.    Ventilation: "If you were unable to breathe on your own and your chance of recovery was unlikely, what would be your preference about the use of a ventilator (breathing machine) if it was available to you?"   The patient is unsure.    Resuscitation: "In the event your heart stopped as a result of an underlying serious health condition, would you want attempts to be made to restart your heart, or would you prefer a natural death?"   Yes, attempt to resuscitate.    Additional topics discussed: treatment goals    Conversation Outcomes / Follow-Up Plan:   ACP complete - no further action today  Reviewed DNR/DNI and patient elects Full Code (Attempt Resuscitation)     Length of Voluntary ACP Conversation in minutes:  <16 minutes (Non-Billable)    Lillia Dallas, NP

## 2019-07-02 NOTE — Progress Notes (Signed)
 Cathy Contreras presents today for   Chief Complaint   Patient presents with   . Menstrual Problem     positive home pregnancy tests       Is someone accompanying this pt? no    Is the patient using any DME equipment during OV? no    Depression Screening:  3 most recent PHQ Screens 07/02/2019   Little interest or pleasure in doing things Not at all   Feeling down, depressed, irritable, or hopeless Not at all   Total Score PHQ 2 0   Trouble falling or staying asleep, or sleeping too much Not at all   Feeling tired or having little energy Several days   Poor appetite, weight loss, or overeating Several days   Feeling bad about yourself - or that you are a failure or have let yourself or your family down Not at all   Trouble concentrating on things such as school, work, reading, or watching TV Not at all   Moving or speaking so slowly that other people could have noticed; or the opposite being so fidgety that others notice Not at all   Thoughts of being better off dead, or hurting yourself in some way Not at all   PHQ 9 Score 2   How difficult have these problems made it for you to do your work, take care of your home and get along with others Not difficult at all       Learning Assessment:  No flowsheet data found.    Abuse Screening:  No flowsheet data found.    Fall Risk  No flowsheet data found.    Health Maintenance reviewed and discussed and ordered per Provider.    Health Maintenance Due   Topic Date Due   . Hepatitis C Screening  Never done   . HPV Age 9Y-26Y (1 - 2-dose series) Never done   . COVID-19 Vaccine (1) Never done   . DTaP/Tdap/Td series (1 - Tdap) Never done   . PAP AKA CERVICAL CYTOLOGY  Never done   . Medicare Yearly Exam  03/17/2018   .      Coordination of Care:  1. Have you been to the ER, urgent care clinic since your last visit? Hospitalized since your last visit? no    2. Have you seen or consulted any other health care providers outside of the [facility] since your last  visit? Include any pap smears or colon screening. no      Last PMP Checked no  Last UDS Checked no  Last Pain contract signed: no

## 2019-07-02 NOTE — Progress Notes (Signed)
This is the Subsequent Medicare Annual Wellness Exam, performed 12 months or more after the Initial AWV or the last Subsequent AWV    I have reviewed the patient's medical history in detail and updated the computerized patient record.       Assessment/Plan   Education and counseling provided:  Are appropriate based on today's review and evaluation    1. Medicare annual wellness visit, subsequent  2. Screening for depression  3. ACP (advance care planning)         Depression Risk Factor Screening     3 most recent PHQ Screens 07/02/2019   Little interest or pleasure in doing things Not at all   Feeling down, depressed, irritable, or hopeless Not at all   Total Score PHQ 2 0   Trouble falling or staying asleep, or sleeping too much Not at all   Feeling tired or having little energy Several days   Poor appetite, weight loss, or overeating Several days   Feeling bad about yourself - or that you are a failure or have let yourself or your family down Not at all   Trouble concentrating on things such as school, work, reading, or watching TV Not at all   Moving or speaking so slowly that other people could have noticed; or the opposite being so fidgety that others notice Not at all   Thoughts of being better off dead, or hurting yourself in some way Not at all   PHQ 9 Score 2   How difficult have these problems made it for you to do your work, take care of your home and get along with others Not difficult at all       Alcohol Risk Screen    Do you average more than 1 drink per night or more than 7 drinks a week:  No    On any one occasion in the past three months have you have had more than 3 drinks containing alcohol:  No        Functional Ability and Level of Safety    Hearing: Hearing is good.      Activities of Daily Living:  The home contains: no safety equipment.  Patient does total self care      Ambulation: with no difficulty     Fall Risk:  No flowsheet data found.   Abuse Screen:  Patient is not abused        Cognitive Screening    Has your family/caregiver stated any concerns about your memory: no     Cognitive Screening: Normal - MMSE (Mini Mental Status Exam)    Health Maintenance Due     Health Maintenance Due   Topic Date Due   ??? Hepatitis C Screening  Never done   ??? HPV Age 9Y-26Y (1 - 2-dose series) Never done   ??? COVID-19 Vaccine (1) Never done   ??? DTaP/Tdap/Td series (1 - Tdap) Never done   ??? PAP AKA CERVICAL CYTOLOGY  Never done   ??? Medicare Yearly Exam  03/17/2018       Patient Care Team   Patient Care Team:  Lillia Dallas, NP as PCP - General (Nurse Practitioner)    History     Patient Active Problem List   Diagnosis Code   ??? Obesity, morbid (HCC) E66.01     Past Medical History:   Diagnosis Date   ??? GERD (gastroesophageal reflux disease)       History reviewed. No pertinent surgical history.  No Known Allergies    Family History   Problem Relation Age of Onset   ??? Diabetes Mother    ??? Hypertension Father    ??? Cancer Paternal Grandmother 52        Colon cancer     Social History     Tobacco Use   ??? Smoking status: Never Smoker   ??? Smokeless tobacco: Never Used   Substance Use Topics   ??? Alcohol use: Yes     Frequency: Monthly or less     Comment: occasional         Lori B Hickman

## 2019-07-02 NOTE — Progress Notes (Signed)
OFFICE NOTE    Cathy Contreras is a 24 y.o. female presenting today for the following:  Chief Complaint   Patient presents with   ??? Menstrual Problem     positive home pregnancy tests   ??? Annual Wellness Visit     [phone]      HPI       Pregnancy Test  Patient states her last menstrual cycle was March 13 (home pregnancy test).  She is normally a little irregular and wanted to get confirmation today.  She did a total of 5 home pregnancy test that looked positive but the test lines were faded. Patient did a urine pregnancy test that confirmed positive.  Patient states she has just had heartburn as her symptoms with mild cramping. Will refer patient to OB/GYN today.                  Review of Systems   Constitutional: Negative for fatigue and fever.   HENT: Negative.    Eyes: Negative.    Respiratory: Negative for cough, chest tightness, shortness of breath and wheezing.    Cardiovascular: Negative for chest pain and palpitations.   Neurological: Negative for dizziness, light-headedness and headaches.         History  Past Medical History:   Diagnosis Date   ??? GERD (gastroesophageal reflux disease)        History reviewed. No pertinent surgical history.    Social History     Socioeconomic History   ??? Marital status: SINGLE     Spouse name: Not on file   ??? Number of children: Not on file   ??? Years of education: Not on file   ??? Highest education level: Not on file   Occupational History   ??? Not on file   Social Needs   ??? Financial resource strain: Not on file   ??? Food insecurity     Worry: Not on file     Inability: Not on file   ??? Transportation needs     Medical: Not on file     Non-medical: Not on file   Tobacco Use   ??? Smoking status: Never Smoker   ??? Smokeless tobacco: Never Used   Substance and Sexual Activity   ??? Alcohol use: Yes     Frequency: Monthly or less     Comment: occasional   ??? Drug use: No   ??? Sexual activity: Yes     Partners: Male     Birth control/protection: None   Lifestyle   ??? Physical activity      Days per week: Not on file     Minutes per session: Not on file   ??? Stress: Not on file   Relationships   ??? Social Wellsite geologist on phone: Not on file     Gets together: Not on file     Attends religious service: Not on file     Active member of club or organization: Not on file     Attends meetings of clubs or organizations: Not on file     Relationship status: Not on file   ??? Intimate partner violence     Fear of current or ex partner: Not on file     Emotionally abused: Not on file     Physically abused: Not on file     Forced sexual activity: Not on file   Other Topics Concern   ??? Not on file   Social  History Narrative   ??? Not on file       No Known Allergies          Patient Care Team:  Patient Care Team:  Grace Isaac, NP as PCP - General (Nurse Practitioner)      LABS:  Results for orders placed or performed in visit on 07/02/19   AMB POC URINE PREGNANCY TEST, VISUAL COLOR COMPARISON   Result Value Ref Range    VALID INTERNAL CONTROL POC Yes     HCG urine, Ql. (POC) Positive Negative        RADIOLOGY:  No recent results      Physical Exam  Vitals signs and nursing note reviewed.   Constitutional:       Appearance: Normal appearance. She is well-developed.   Neck:      Musculoskeletal: Normal range of motion and neck supple.   Cardiovascular:      Rate and Rhythm: Normal rate and regular rhythm.      Heart sounds: Normal heart sounds.   Pulmonary:      Effort: Pulmonary effort is normal. No respiratory distress.      Breath sounds: Normal breath sounds. No wheezing or rales.   Abdominal:      General: Bowel sounds are normal.      Palpations: Abdomen is soft.   Musculoskeletal: Normal range of motion.   Lymphadenopathy:      Cervical: No cervical adenopathy.   Skin:     General: Skin is warm and dry.   Neurological:      Mental Status: She is alert and oriented to person, place, and time.      Deep Tendon Reflexes: Reflexes are normal and symmetric.   Psychiatric:         Mood and Affect:  Mood normal.           Vitals:    07/02/19 1043   BP: 129/77   Pulse: 91   Resp: 24   Temp: 97.7 ??F (36.5 ??C)   TempSrc: Oral   SpO2: 99%   Weight: 260 lb (117.9 kg)   Height: 5\' 7"  (1.702 m)   PainSc:   0 - No pain   LMP: 05/23/2019         Assessment and Plan    1. Pregnancy test positive    - AMB POC URINE PREGNANCY TEST, VISUAL COLOR COMPARISON      MDM    Procedures      *Plan of care reviewed with patient. Patient in agreement with plan and expresses understanding. All questions answered and patient encouraged to call or RTO if further questions or concerns. Advised patient if symptoms worsen to go to nearest ER or call 911.  AVS and recommendations given to patient upon discharge.

## 2019-07-02 NOTE — Progress Notes (Signed)
This is the Subsequent Medicare Annual Wellness Exam, performed 12 months or more after the Initial AWV or the last Subsequent AWV    I have reviewed the patient's medical history in detail and updated the computerized patient record.       Assessment/Plan   Education and counseling provided:  Are appropriate based on today's review and evaluation    1. Medicare annual wellness visit, subsequent  2. Screening for depression  3. ACP (advance care planning)         Depression Risk Factor Screening     3 most recent PHQ Screens 07/02/2019   Little interest or pleasure in doing things Not at all   Feeling down, depressed, irritable, or hopeless Not at all   Total Score PHQ 2 0   Trouble falling or staying asleep, or sleeping too much Not at all   Feeling tired or having little energy Several days   Poor appetite, weight loss, or overeating Several days   Feeling bad about yourself - or that you are a failure or have let yourself or your family down Not at all   Trouble concentrating on things such as school, work, reading, or watching TV Not at all   Moving or speaking so slowly that other people could have noticed; or the opposite being so fidgety that others notice Not at all   Thoughts of being better off dead, or hurting yourself in some way Not at all   PHQ 9 Score 2   How difficult have these problems made it for you to do your work, take care of your home and get along with others Not difficult at all       Alcohol Risk Screen    Do you average more than 1 drink per night or more than 7 drinks a week:  No    On any one occasion in the past three months have you have had more than 3 drinks containing alcohol:  No        Functional Ability and Level of Safety    Hearing: Hearing is good.      Activities of Daily Living:  The home contains: no safety equipment.  Patient does total self care      Ambulation: with no difficulty     Fall Risk:  No flowsheet data found.   Abuse Screen:  Patient is not abused        Cognitive Screening    Has your family/caregiver stated any concerns about your memory: no     Cognitive Screening: Normal - MMSE (Mini Mental Status Exam)    Health Maintenance Due     Health Maintenance Due   Topic Date Due   ??? Hepatitis C Screening  Never done   ??? HPV Age 9Y-26Y (1 - 2-dose series) Never done   ??? COVID-19 Vaccine (1) Never done   ??? DTaP/Tdap/Td series (1 - Tdap) Never done   ??? PAP AKA CERVICAL CYTOLOGY  Never done   ??? Medicare Yearly Exam  03/17/2018       Patient Care Team   Patient Care Team:  Lillia Dallas, NP as PCP - General (Nurse Practitioner)    History     Patient Active Problem List   Diagnosis Code   ??? Obesity, morbid (HCC) E66.01     Past Medical History:   Diagnosis Date   ??? GERD (gastroesophageal reflux disease)       History reviewed. No pertinent surgical history.  No Known Allergies    Family History   Problem Relation Age of Onset   ??? Diabetes Mother    ??? Hypertension Father    ??? Cancer Paternal Grandmother 62        Colon cancer     Social History     Tobacco Use   ??? Smoking status: Never Smoker   ??? Smokeless tobacco: Never Used   Substance Use Topics   ??? Alcohol use: Yes     Frequency: Monthly or less     Comment: occasional         Lori B Hickman

## 2019-07-02 NOTE — ACP (Advance Care Planning) (Signed)
Advance Care Planning     Advance Care Planning (ACP) Physician/NP/PA Conversation      Date of Conversation: 07/02/2019  Conducted with: Patient with Decision Making Capacity    Healthcare Decision Maker:     Click here to complete HealthCare Decision Makers including selection of the Healthcare Decision Maker Relationship (ie "Primary")  Today we documented Decision Maker(s) consistent with Legal Next of Kin hierarchy.    Care Preferences:    Hospitalization: "If your health worsens and it becomes clear that your chance of recovery is unlikely, what would be your preference regarding hospitalization?"  The patient would prefer comfort-focused treatment without hospitalization.    Ventilation: "If you were unable to breathe on your own and your chance of recovery was unlikely, what would be your preference about the use of a ventilator (breathing machine) if it was available to you?"   The patient is unsure.    Resuscitation: "In the event your heart stopped as a result of an underlying serious health condition, would you want attempts to be made to restart your heart, or would you prefer a natural death?"   Yes, attempt to resuscitate.    Additional topics discussed: treatment goals    Conversation Outcomes / Follow-Up Plan:   ACP complete - no further action today  Reviewed DNR/DNI and patient elects Full Code (Attempt Resuscitation)     Length of Voluntary ACP Conversation in minutes:  <16 minutes (Non-Billable)    Cathy Houseworth L Savage-Artis, NP

## 2019-07-02 NOTE — Progress Notes (Signed)
OFFICE NOTE    Cathy Contreras is a 24 y.o. female presenting today for the following:  Chief Complaint   Patient presents with   ??? Menstrual Problem     positive home pregnancy tests   ??? Annual Wellness Visit     [phone]      HPI       Pregnancy Test  Patient states her last menstrual cycle was March 13 (home pregnancy test).  She is normally a little irregular and wanted to get confirmation today.  She did a total of 5 home pregnancy test that looked positive but the test lines were faded. Patient did a urine pregnancy test that confirmed positive.  Patient states she has just had heartburn as her symptoms with mild cramping. Will refer patient to OB/GYN today.                  Review of Systems   Constitutional: Negative for fatigue and fever.   HENT: Negative.    Eyes: Negative.    Respiratory: Negative for cough, chest tightness, shortness of breath and wheezing.    Cardiovascular: Negative for chest pain and palpitations.   Neurological: Negative for dizziness, light-headedness and headaches.         History  Past Medical History:   Diagnosis Date   ??? GERD (gastroesophageal reflux disease)        History reviewed. No pertinent surgical history.    Social History     Socioeconomic History   ??? Marital status: SINGLE     Spouse name: Not on file   ??? Number of children: Not on file   ??? Years of education: Not on file   ??? Highest education level: Not on file   Occupational History   ??? Not on file   Social Needs   ??? Financial resource strain: Not on file   ??? Food insecurity     Worry: Not on file     Inability: Not on file   ??? Transportation needs     Medical: Not on file     Non-medical: Not on file   Tobacco Use   ??? Smoking status: Never Smoker   ??? Smokeless tobacco: Never Used   Substance and Sexual Activity   ??? Alcohol use: Yes     Frequency: Monthly or less     Comment: occasional   ??? Drug use: No   ??? Sexual activity: Yes     Partners: Male     Birth control/protection: None   Lifestyle   ??? Physical activity      Days per week: Not on file     Minutes per session: Not on file   ??? Stress: Not on file   Relationships   ??? Social Wellsite geologist on phone: Not on file     Gets together: Not on file     Attends religious service: Not on file     Active member of club or organization: Not on file     Attends meetings of clubs or organizations: Not on file     Relationship status: Not on file   ??? Intimate partner violence     Fear of current or ex partner: Not on file     Emotionally abused: Not on file     Physically abused: Not on file     Forced sexual activity: Not on file   Other Topics Concern   ??? Not on file   Social  History Narrative   ??? Not on file       No Known Allergies          Patient Care Team:  Patient Care Team:  Savage-Artis, Shia Eber L, NP as PCP - General (Nurse Practitioner)      LABS:  Results for orders placed or performed in visit on 07/02/19   AMB POC URINE PREGNANCY TEST, VISUAL COLOR COMPARISON   Result Value Ref Range    VALID INTERNAL CONTROL POC Yes     HCG urine, Ql. (POC) Positive Negative        RADIOLOGY:  No recent results      Physical Exam  Vitals signs and nursing note reviewed.   Constitutional:       Appearance: Normal appearance. She is well-developed.   Neck:      Musculoskeletal: Normal range of motion and neck supple.   Cardiovascular:      Rate and Rhythm: Normal rate and regular rhythm.      Heart sounds: Normal heart sounds.   Pulmonary:      Effort: Pulmonary effort is normal. No respiratory distress.      Breath sounds: Normal breath sounds. No wheezing or rales.   Abdominal:      General: Bowel sounds are normal.      Palpations: Abdomen is soft.   Musculoskeletal: Normal range of motion.   Lymphadenopathy:      Cervical: No cervical adenopathy.   Skin:     General: Skin is warm and dry.   Neurological:      Mental Status: She is alert and oriented to person, place, and time.      Deep Tendon Reflexes: Reflexes are normal and symmetric.   Psychiatric:         Mood and Affect:  Mood normal.           Vitals:    07/02/19 1043   BP: 129/77   Pulse: 91   Resp: 24   Temp: 97.7 ??F (36.5 ??C)   TempSrc: Oral   SpO2: 99%   Weight: 260 lb (117.9 kg)   Height: 5' 7" (1.702 m)   PainSc:   0 - No pain   LMP: 05/23/2019         Assessment and Plan    1. Pregnancy test positive    - AMB POC URINE PREGNANCY TEST, VISUAL COLOR COMPARISON      MDM    Procedures      *Plan of care reviewed with patient. Patient in agreement with plan and expresses understanding. All questions answered and patient encouraged to call or RTO if further questions or concerns. Advised patient if symptoms worsen to go to nearest ER or call 911.  AVS and recommendations given to patient upon discharge.

## 2019-07-02 NOTE — Progress Notes (Signed)
Cathy Contreras presents today for   Chief Complaint   Patient presents with   ??? Menstrual Problem     positive home pregnancy tests       Is someone accompanying this pt? no    Is the patient using any DME equipment during OV? no    Depression Screening:  3 most recent PHQ Screens 07/02/2019   Little interest or pleasure in doing things Not at all   Feeling down, depressed, irritable, or hopeless Not at all   Total Score PHQ 2 0   Trouble falling or staying asleep, or sleeping too much Not at all   Feeling tired or having little energy Several days   Poor appetite, weight loss, or overeating Several days   Feeling bad about yourself - or that you are a failure or have let yourself or your family down Not at all   Trouble concentrating on things such as school, work, reading, or watching TV Not at all   Moving or speaking so slowly that other people could have noticed; or the opposite being so fidgety that others notice Not at all   Thoughts of being better off dead, or hurting yourself in some way Not at all   PHQ 9 Score 2   How difficult have these problems made it for you to do your work, take care of your home and get along with others Not difficult at all       Learning Assessment:  No flowsheet data found.    Abuse Screening:  No flowsheet data found.    Fall Risk  No flowsheet data found.    Health Maintenance reviewed and discussed and ordered per Provider.    Health Maintenance Due   Topic Date Due   ??? Hepatitis C Screening  Never done   ??? HPV Age 9Y-26Y (1 - 2-dose series) Never done   ??? COVID-19 Vaccine (1) Never done   ??? DTaP/Tdap/Td series (1 - Tdap) Never done   ??? PAP AKA CERVICAL CYTOLOGY  Never done   ??? Medicare Yearly Exam  03/17/2018   .      Coordination of Care:  1. Have you been to the ER, urgent care clinic since your last visit? Hospitalized since your last visit? no    2. Have you seen or consulted any other health care providers outside of the [facility] since your last  visit? Include any pap smears or colon screening. no      Last PMP Checked no  Last UDS Checked no  Last Pain contract signed: no

## 2019-07-02 NOTE — Patient Instructions (Addendum)
Medicare Wellness Visit, Female     The best way to live healthy is to have a lifestyle where you eat a well-balanced diet, exercise regularly, limit alcohol use, and quit all forms of tobacco/nicotine, if applicable.     Regular preventive services are another way to keep healthy. Preventive services (vaccines, screening tests, monitoring & exams) can help personalize your care plan, which helps you manage your own care. Screening tests can find health problems at the earliest stages, when they are easiest to treat.   [facility] follows the current, evidence-based guidelines published by the [place] [employer] (USPSTF) when recommending preventive services for our patients. Because we follow these guidelines, sometimes recommendations change over time as research supports it. (For example, mammograms used to be recommended annually. Even though Medicare will still pay for an annual mammogram, the newer guidelines recommend a mammogram every two years for women of average risk).  Of course, you and your doctor may decide to screen more often for some diseases, based on your risk and your co-morbidities (chronic disease you are already diagnosed with).     Preventive services for you include:  - Medicare offers their members a free annual wellness visit, which is time for you and your primary care provider to discuss and plan for your preventive service needs. Take advantage of this benefit every year!  -All adults over the age of 32 should receive the recommended pneumonia vaccines. Current USPSTF guidelines recommend a series of two vaccines for the best pneumonia protection.   -All adults should have a flu vaccine yearly and a tetanus vaccine every 10 years.   -All adults age 65 and older should receive the shingles vaccines (series of two vaccines).      -All adults age 69-70 who are overweight should have a diabetes screening test once every three years.   -All  adults born between [birth year] and 1965 should be screened once for Hepatitis C.  -Other screening tests and preventive services for persons with diabetes include: an eye exam to screen for diabetic retinopathy, a kidney function test, a foot exam, and stricter control over your cholesterol.   -Cardiovascular screening for adults with routine risk involves an electrocardiogram (ECG) at intervals determined by your doctor.   -Colorectal cancer screenings should be done for adults age 58-75 with no increased risk factors for colorectal cancer.  There are a number of acceptable methods of screening for this type of cancer. Each test has its own benefits and drawbacks. Discuss with your doctor what is most appropriate for you during your annual wellness visit. The different tests include: colonoscopy (considered the best screening method), a fecal occult blood test, a fecal DNA test, and sigmoidoscopy.    -A bone mass density test is recommended when a woman turns 65 to screen for osteoporosis. This test is only recommended one time, as a screening. Some providers will use this same test as a disease monitoring tool if you already have osteoporosis.  -Breast cancer screenings are recommended every other year for women of normal risk, age 53-74.  -Cervical cancer screenings for women over age 50 are only recommended with certain risk factors.     Here is a list of your current Health Maintenance items (your personalized list of preventive services) with a due date:  Health Maintenance Due   Topic Date Due   ??? Hepatitis C Test  Never done   ??? HPV Vaccine (1 - 2-dose series)  Never done   ??? COVID-19 Vaccine (1) Never done   ??? DTaP/Tdap/Td  (1 - Tdap) Never done   ??? Pap Test  Never done   ??? Annual Well Visit  03/17/2018            Learning About Starting to Breastfeed  Planning ahead     Before your baby is born, plan ahead. Learn all you can about breastfeeding. This helps make breastfeeding easier.  ?? Early in your pregnancy,  talk to your doctor or midwife about breastfeeding.  ?? Learn the basics before your baby is born. The staff at hospitals and birthing centers can help you find a lactation specialist. This person is often a nurse who has been trained to teach and advise women about breastfeeding. Or you can take a breastfeeding class.  ?? Plan ahead for times when you will need help after your baby is born. Many women get help from friends and family. Some join a support group to talk to other moms who breastfeed.  ?? Buy the equipment you'll need. Examples are breast pads, nipple cream, extra pillows, and nursing bras. Find out about breast pumps too.  Getting help from your hospital or birthing center  It's important to have support from the doctors, nurses, and hospital staff who care for you and your baby. Before it's time for you to give birth, ask about the breastfeeding policies at your hospital or birthing center. Look for a hospital or birthing center that has policies for:  ?? "Rooming in." This policy encourages you to have your baby in the room with you. It can allow you to breastfeed more often.  ?? Supplemental feedings. Tell the staff that your baby is to get only your breast milk from birth. If staff feed your baby water, sugar solution, or formula right after birth without a medical reason, it may make it harder for you to breastfeed.  ?? Pacifiers or artificial nipples. Staff should not give your newborn these items. They may interfere with breastfeeding.  ?? Follow-up. Find out if your hospital can help you with breastfeeding issues after you go home. See if you can get information on support groups or other contacts. They might help if you need help setting up and staying with your breastfeeding routine.  Your first feeding  It's best to start breastfeeding within 1 hour of birth. For each feeding, you go through these basic steps:  ?? Get ready for the feeding. Be calm and relaxed, and try not to be distracted. Get  some water or juice for yourself. Use two or three pillows to help support your baby while he or she is nursing.  ?? Find a breastfeeding position that is comfortable for you and your baby. Examples are the cradle and the football positions. Make sure the baby's head and chest are lined up straight and facing your breast. It's best to switch which breast you start with each time.  ?? Get the baby latched on well. Your baby's mouth needs to be wide open, like a yawn, so you may need to gently touch the middle of your baby's lower lip. When your baby's mouth is open wide, quickly bring the baby onto your nipple and areola. The areola is the dark circle around your nipple.  ?? Provide a complete feeding. Let your baby decide how long to nurse. Be sure to burp your baby after each breast.  In the first days after birth, your breasts make a thick, yellow  liquid called colostrum. This liquid gives your baby nutrients and antibodies against infection. It is all that babies need at first. Your breasts will fill with milk a few days after the birth.  Talk to your doctor, midwife, or lactation specialist right away if you are having problems and aren't sure what to do.  How often to breastfeed  Plan to breastfeed your baby on demand rather than setting a strict schedule. For the first 2 weeks, be prepared to breastfeed at least 8 times in a 24-hour period. In the first few days, you may need to wake a sleeping baby to feed. If you breastfeed more often, it will help your breasts to produce more milk.  After you go home  After you're home, don't be afraid to call your doctor, midwife, or lactation specialist with questions. That's true even if you don't know what's bothering you. They are used to parents of newborns calling. They can help you figure out if there is a problem, and if so, how to fix it.  Plan for times when you will be apart from your baby. Use a breast pump to collect breast milk ahead of time. You can store milk  in the refrigerator or freezer. Then it's ready when someone else will be taking care of your baby. Experts recommend waiting about a month until breastfeeding is going well before offering a bottle.  Breastfeeding is a learned skill that gets easier over time. You are more likely to succeed if you plan ahead, learn the basic techniques, and know where to get help and support.  Where can you learn more?  Go to [employer]  Enter Q917 in the search box to learn more about "Learning About Starting to Breastfeed."  Current as of: December 26, 2018??????????????????????????????Content Version: 12.8  ?? 2006-2021 Healthwise[employer].   Care instructions adapted under license by [employer] (which disclaims liability or warranty for this information). If you have questions about a medical condition or this instruction, always ask your healthcare professional. Healthwise, Incorporated disclaims any warranty or liability for your use of this information.

## 2020-05-15 LAB — AMB EXT LDL-C
LDL-C, External: 101
LDL-C, External: 101 NA

## 2020-05-15 LAB — AMB EXT CREATININE
Creatinine, External: 0.65
Creatinine, External: 0.65 NA

## 2020-05-15 LAB — AMB EXT HGBA1C
Hemoglobin A1C, External: 5 %
Hemoglobin A1c, External: 5 %

## 2020-07-01 ENCOUNTER — Telehealth: Attending: Family Medicine | Primary: Family Medicine

## 2020-07-01 ENCOUNTER — Telehealth: Admit: 2020-07-01 | Discharge: 2020-07-01 | Payer: MEDICAID | Attending: Family Medicine | Primary: Family Medicine

## 2020-07-01 DIAGNOSIS — D649 Anemia, unspecified: Secondary | ICD-10-CM

## 2020-07-01 MED ORDER — IBUPROFEN 800 MG TAB
800 mg | ORAL_TABLET | Freq: Three times a day (TID) | ORAL | 0 refills | Status: AC | PRN
Start: 2020-07-01 — End: 2020-07-16

## 2020-07-01 MED ORDER — CYCLOBENZAPRINE 10 MG TAB
10 mg | ORAL_TABLET | Freq: Two times a day (BID) | ORAL | 0 refills | Status: AC | PRN
Start: 2020-07-01 — End: 2020-07-31

## 2020-07-01 NOTE — Progress Notes (Signed)
Cathy Contreras (DOB: 1995/12/31) is a 25 y.o. female, established patient, here for evaluation of the following chief complaint(s):   Follow-up (Pt. Just had Baby 3 months ago)       ASSESSMENT/PLAN:  Below is the assessment and plan developed based on review of pertinent history, labs, studies, and medications.    1. Anemia, unspecified type  -     IRON PROFILE; Future  -     FERRITIN; Future  2. Neck pain  -     cyclobenzaprine (FLEXERIL) 10 mg tablet; Take 0.5 Tablets by mouth two (2) times daily as needed for Muscle Spasm(s) for up to 30 days., Normal, Disp-60 Tablet, R-0  -     ibuprofen (MOTRIN) 800 mg tablet; Take 1 Tablet by mouth every eight (8) hours as needed for Pain for up to 15 days., Normal, Disp-45 Tablet, R-0  -     REFERRAL TO PHYSICAL THERAPY  3. Cervical radiculitis  -     cyclobenzaprine (FLEXERIL) 10 mg tablet; Take 0.5 Tablets by mouth two (2) times daily as needed for Muscle Spasm(s) for up to 30 days., Normal, Disp-60 Tablet, R-0  -     ibuprofen (MOTRIN) 800 mg tablet; Take 1 Tablet by mouth every eight (8) hours as needed for Pain for up to 15 days., Normal, Disp-45 Tablet, R-0  -     REFERRAL TO PHYSICAL THERAPY      Return in about 3 months (around 09/30/2020), or if symptoms worsen or fail to improve, for est care new PCP-list will be mailed to patient..    SUBJECTIVE/OBJECTIVE:  HPI     Your A1c has this is a 25 year old African-American female with past medical history significant for morbid obesity, depression/anxiety, recent delivery of her child about 4 months ago who is here to follow-up on blood work done by her psychiatrist who stated she may need medication for her anemia and should follow up with PCP.    Anemia  Waiting on fax from her psychiatrist regarding blood work.  Will order iron studies at this time advised patient to have it done at the hospital.  We will consider starting iron tablets and vitamin C should she have low hemoglobin levels.    Neck pain/shoulder  pain  Patient states that she feels like she has a pinched nerve in her neck.  She has been having pain for about 3 months now.  It is a daily pain.  She has not taken anything for it.  She has tried massaging and here and that has not helped.  She states that it is a shooting pain from her neck to her shoulder blade.  Denies any weakness in her extremities.  We will try Flexeril to relax the muscles and ibuprofen for inflammation.  Advised that she should take Flexeril at bedtime because it can make her Drowsy.  We will also refer patient to physical therapy to help with neck and shoulder exercises that might relieve the pain.  Advised patient to maintain good posture as this can aggravate neck and shoulder pain.    Advised patient to follow up/est care with new PCP in 3 months as I am leaving the area. She will be mailed a list of providers in the area that are accepting new patients. Advised patient to follow up in office should her symptoms get worse     Review of Systems   Constitutional: Negative for activity change, appetite change, fatigue and unexpected weight change.   HENT:  Negative for congestion and rhinorrhea.    Eyes: Negative for pain and redness.   Respiratory: Negative for chest tightness, shortness of breath and wheezing.    Cardiovascular: Negative for chest pain, palpitations and leg swelling.   Gastrointestinal: Negative for abdominal distention, abdominal pain, blood in stool, diarrhea and nausea.   Endocrine: Negative for cold intolerance.   Genitourinary: Negative for menstrual problem and vaginal bleeding.   Musculoskeletal: Positive for neck pain.   Neurological: Negative for dizziness, light-headedness and headaches.   Hematological: Does not bruise/bleed easily.   Psychiatric/Behavioral: Negative for agitation, behavioral problems and sleep disturbance. The patient is not nervous/anxious.         No data recorded     Physical Exam    [INSTRUCTIONS:  "[x] " Indicates a positive item  "[] "  Indicates a negative item  -- DELETE ALL ITEMS NOT EXAMINED]    Constitutional: [x]  Appears well-developed and well-nourished [x]  No apparent distress      []  Abnormal -     Mental status: [x]  Alert and awake  [x]  Oriented to person/place/time [x]  Able to follow commands    []  Abnormal -     Eyes:   EOM    [x]   Normal    []  Abnormal -   Sclera  [x]   Normal    []  Abnormal -          Discharge [x]   None visible   []  Abnormal -     HENT: [x]  Normocephalic, atraumatic  []  Abnormal -   [x]  Mouth/Throat: Mucous membranes are moist    External Ears [x]  Normal  []  Abnormal -    Neck: [x]  No visualized mass []  Abnormal -     Pulmonary/Chest: [x]  Respiratory effort normal   [x]  No visualized signs of difficulty breathing or respiratory distress        []  Abnormal -      Musculoskeletal:   [x]  Normal gait with no signs of ataxia         [x]  Normal range of motion of neck        []  Abnormal -     Neurological:        [x]  No Facial Asymmetry (Cranial nerve 7 motor function) (limited exam due to video visit)          [x]  No gaze palsy        []  Abnormal -          Skin:        [x]  No significant exanthematous lesions or discoloration noted on facial skin         []  Abnormal -            Psychiatric:       [x]  Normal Affect []  Abnormal -        [x]  No Hallucinations    Other pertinent observable physical exam findings:-        On this date 07/01/2020 I have spent 20 minutes reviewing previous notes, test results and face to face (virtual) with the patient discussing the diagnosis and importance of compliance with the treatment plan as well as documenting on the day of the visit.    , was evaluated through a synchronous (real-time) audio-video encounter. The patient (or guardian if applicable) is aware that this is a billable service, which includes applicable co-pays. Verbal consent to proceed has been obtained. The visit was conducted pursuant to the emergency declaration under the and the  Emergencies Act, 1135 waiver authority and the Coronavirus Preparedness and Response Supplemental Appropriations Act.  Patient identification was verified, and a caregiver was present when appropriate. The patient was located at home in a state where the provider was licensed to provide care.       An electronic signature was used to authenticate this note.  -- Alden Hipp, MD

## 2020-07-08 ENCOUNTER — Telehealth

## 2020-07-08 MED ORDER — CHOLECALCIFEROL (VITAMIN D3) 2,000 UNIT CAPSULE
ORAL_CAPSULE | Freq: Two times a day (BID) | ORAL | 3 refills | Status: AC
Start: 2020-07-08 — End: 2020-10-06

## 2020-07-08 MED ORDER — ASCORBIC ACID 250 MG TAB
250 mg | ORAL_TABLET | Freq: Two times a day (BID) | ORAL | 3 refills | Status: AC
Start: 2020-07-08 — End: 2020-10-06

## 2020-07-08 MED ORDER — FERROUS SULFATE 325 MG (65 MG ELEMENTAL IRON) TAB, DELAYED RELEASE
325 mg (65 mg iron) | ORAL_TABLET | Freq: Two times a day (BID) | ORAL | 3 refills | Status: AC
Start: 2020-07-08 — End: 2020-10-06

## 2020-07-08 NOTE — Telephone Encounter (Signed)
Reviewed patient's lab work from psychiatrist which stated that her vitamin D level was 21.1 we will start her on daily vitamin D of 2000 units.  CBC showed that she is anemic with a hemoglobin level of 10.9.  We will check iron studies.  I have prescribed ferrous sulfate 325 mg twice daily along with vitamin C to 50 mg twice daily for better absorption.  Advised patient to take it on full stomach.  Counseled that patient's ALP was elevated and this can sometimes be due to gallbladder or liver issues or bone issues.  We will recheck this number in about 4 to 6 months.  Advised patient to establish care with new PCP to have this rechecked.  Advised patient to avoid fatty greasy meals that could have aggravate gallbladder issues in the meantime. Should ALP stay elevated consider [place] of abdomen to rule out fatty liver as a cause as well.  Rest of her lab work was within normal limits.

## 2020-07-12 ENCOUNTER — Inpatient Hospital Stay: Payer: MEDICARE | Attending: Rehabilitative and Restorative Service Providers" | Primary: Family Medicine

## 2020-10-14 NOTE — Telephone Encounter (Signed)
-----   Message from Valentina Lucks sent at 10/14/2020  9:19 AM EDT -----  Subject: Appointment Request    Reason for Call: New Patient/New to Provider Appointment needed: Urgent   (Patient Request) Medication Issues    QUESTIONS    Reason for appointment request? No appointments available during search     Additional Information for Provider? Alda Lea called on 07/28 @ 9:15   am. She is requesting to establish care with a PCP in your office after   Dr. Alden Hipp left the Practice. She is having Medication Issues; no   dizziness but has been vomiting due to her Prescription sertraline   (ZOLOFT) 50 mg tablet that she has been taking for 6 months, the symptoms   started a month ago. Please call back when available to schedule.  ---------------------------------------------------------------------------  --------------  Cleotis Lema INFO  [phone]; OK to leave message on voicemail  ---------------------------------------------------------------------------  --------------  SCRIPT ANSWERS  COVID Screen: [facility]

## 2020-10-14 NOTE — Telephone Encounter (Signed)
Called patient back no answer-left message that I would mail a copy of new PCP's in the area who are accepting new patients.  Recommended urgent care if the nausea and vomiting continue

## 2020-10-14 NOTE — Telephone Encounter (Signed)
May need to contact pt to give advice on symptoms and to inform she will need to seek a new PCP at another location

## 2021-02-18 ENCOUNTER — Inpatient Hospital Stay (HOSPITAL_COMMUNITY)
Admission: EM | Admit: 2021-02-18 | Discharge: 2021-02-18 | Disposition: A | Discharge status: Home / Self Care | Payer: Medicare (Managed Care) | Attending: Obstetrics & Gynecology | Admitting: Obstetrics & Gynecology

## 2021-02-18 ENCOUNTER — Inpatient Hospital Stay (HOSPITAL_BASED_OUTPATIENT_CLINIC_OR_DEPARTMENT_OTHER): Payer: Medicare (Managed Care)

## 2021-02-18 ENCOUNTER — Encounter (HOSPITAL_COMMUNITY): Payer: Self-pay | Admitting: Emergency Medicine

## 2021-02-18 DIAGNOSIS — B9689 Other specified bacterial agents as the cause of diseases classified elsewhere: Secondary | ICD-10-CM | POA: Insufficient documentation

## 2021-02-18 DIAGNOSIS — O0933 Supervision of pregnancy with insufficient antenatal care, third trimester: Secondary | ICD-10-CM

## 2021-02-18 DIAGNOSIS — O26893 Other specified pregnancy related conditions, third trimester: Secondary | ICD-10-CM | POA: Diagnosis present

## 2021-02-18 DIAGNOSIS — Z3A28 28 weeks gestation of pregnancy: Secondary | ICD-10-CM

## 2021-02-18 DIAGNOSIS — O0930 Supervision of pregnancy with insufficient antenatal care, unspecified trimester: Secondary | ICD-10-CM

## 2021-02-18 DIAGNOSIS — M545 Low back pain, unspecified: Secondary | ICD-10-CM | POA: Diagnosis not present

## 2021-02-18 DIAGNOSIS — Z3689 Encounter for other specified antenatal screening: Secondary | ICD-10-CM | POA: Diagnosis not present

## 2021-02-18 DIAGNOSIS — O26892 Other specified pregnancy related conditions, second trimester: Secondary | ICD-10-CM

## 2021-02-18 DIAGNOSIS — O23593 Infection of other part of genital tract in pregnancy, third trimester: Secondary | ICD-10-CM | POA: Diagnosis not present

## 2021-02-18 LAB — CBC
HCT: 35.6 % — ABNORMAL LOW (ref 36.0–46.0)
Hemoglobin: 11 g/dL — ABNORMAL LOW (ref 12.0–15.0)
MCH: 23.9 pg — ABNORMAL LOW (ref 26.0–34.0)
MCHC: 30.9 g/dL (ref 30.0–36.0)
MCV: 77.2 fL — ABNORMAL LOW (ref 80.0–100.0)
Platelets: 332 10*3/uL (ref 150–400)
RBC: 4.61 MIL/uL (ref 3.87–5.11)
RDW: 16 % — ABNORMAL HIGH (ref 11.5–15.5)
WBC: 10.4 10*3/uL (ref 4.0–10.5)
nRBC: 0 % (ref 0.0–0.2)

## 2021-02-18 LAB — COMPREHENSIVE METABOLIC PANEL
ALT: 8 U/L (ref 0–44)
AST: 14 U/L — ABNORMAL LOW (ref 15–41)
Albumin: 3 g/dL — ABNORMAL LOW (ref 3.5–5.0)
Alkaline Phosphatase: 140 U/L — ABNORMAL HIGH (ref 38–126)
Anion gap: 8 (ref 5–15)
BUN: 5 mg/dL — ABNORMAL LOW (ref 6–20)
CO2: 20 mmol/L — ABNORMAL LOW (ref 22–32)
Calcium: 9.3 mg/dL (ref 8.9–10.3)
Chloride: 105 mmol/L (ref 98–111)
Creatinine, Ser: 0.51 mg/dL (ref 0.44–1.00)
GFR, Estimated: >60 mL/min (ref >60)
Glucose, Bld: 100 mg/dL — ABNORMAL HIGH (ref 70–99)
Potassium: 3.9 mmol/L (ref 3.5–5.1)
Sodium: 133 mmol/L — ABNORMAL LOW (ref 135–145)
Total Bilirubin: 0.4 mg/dL (ref 0.3–1.2)
Total Protein: 7.4 g/dL (ref 6.5–8.1)

## 2021-02-18 LAB — WET PREP, GENITAL
Sperm: NONE SEEN
Trich, Wet Prep: NONE SEEN
WBC, Wet Prep HPF POC: <10 (ref <10)
Yeast Wet Prep HPF POC: NONE SEEN

## 2021-02-18 LAB — URINALYSIS, ROUTINE W REFLEX MICROSCOPIC
Bilirubin Urine: NEGATIVE
Glucose, UA: NEGATIVE mg/dL
Hgb urine dipstick: NEGATIVE
Ketones, ur: NEGATIVE mg/dL
Leukocytes,Ua: NEGATIVE
Nitrite: NEGATIVE
Protein, ur: NEGATIVE mg/dL
Specific Gravity, Urine: 1.014 (ref 1.005–1.030)
pH: 7 (ref 5.0–8.0)

## 2021-02-18 LAB — POC URINE PREG, ED: Preg Test, Ur: POSITIVE — AB

## 2021-02-18 LAB — ABO/RH: ABO/RH(D): O POS

## 2021-02-18 IMAGING — US US MFM OB COMP +14 WKS
1 series · 14 of 28 positions shown · non-contrast
Comparison: none

[Series 1: us mfm ob comp +14 wks · 29 acquisitions, 14 frames shown]
[im 2/29]
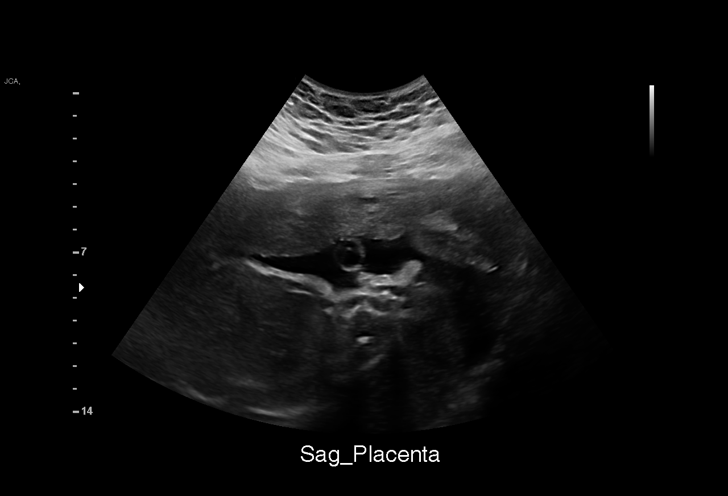
[im 4/29]
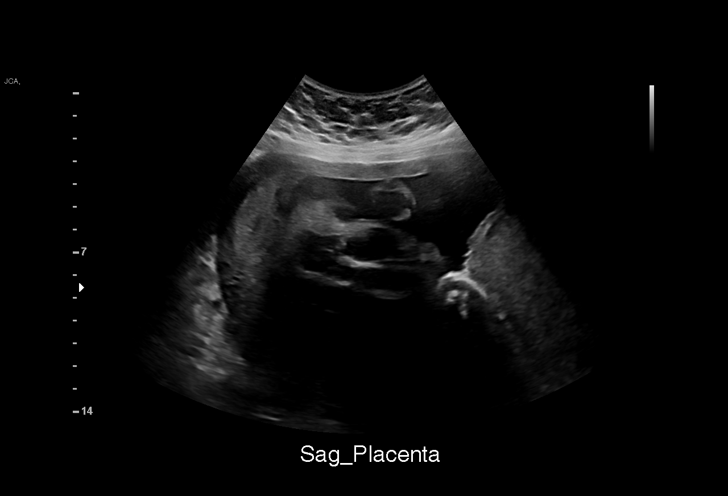
[im 6/29]
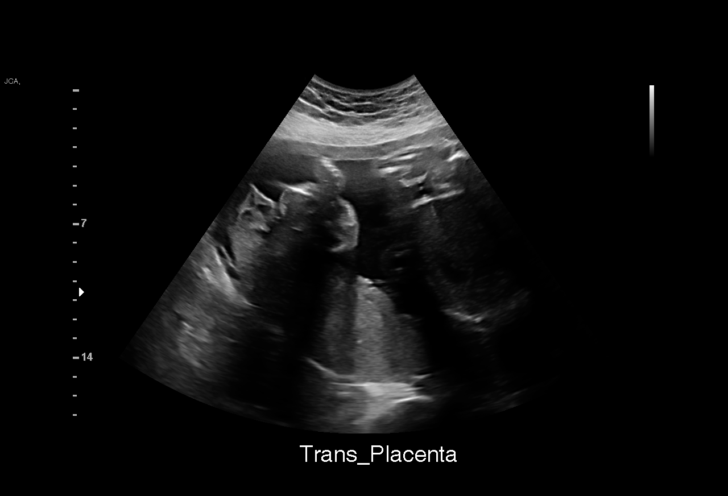
[im 8/29]
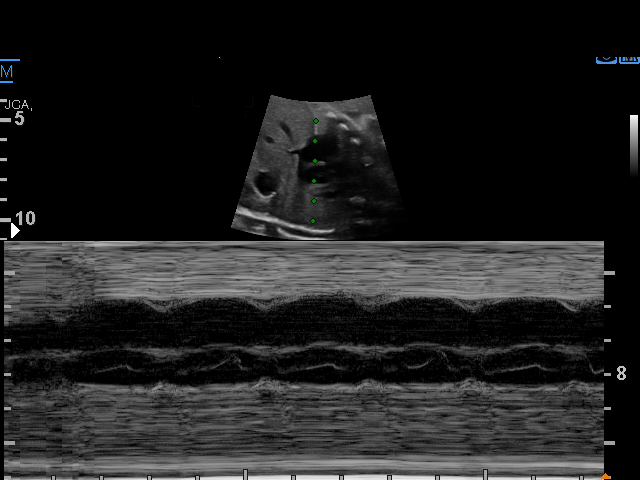
[im 10/29]
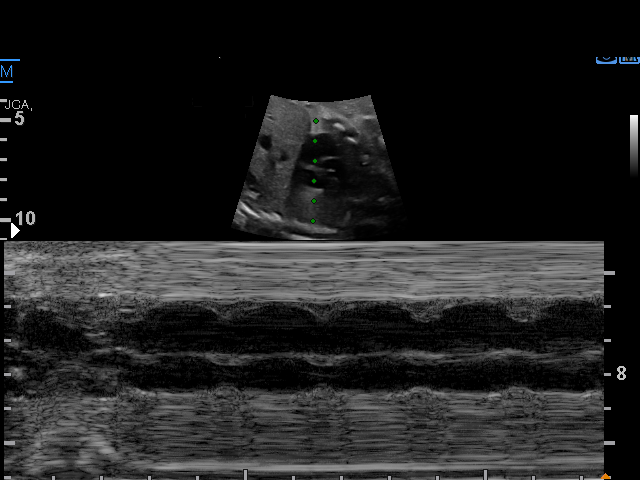
[im 12/29]
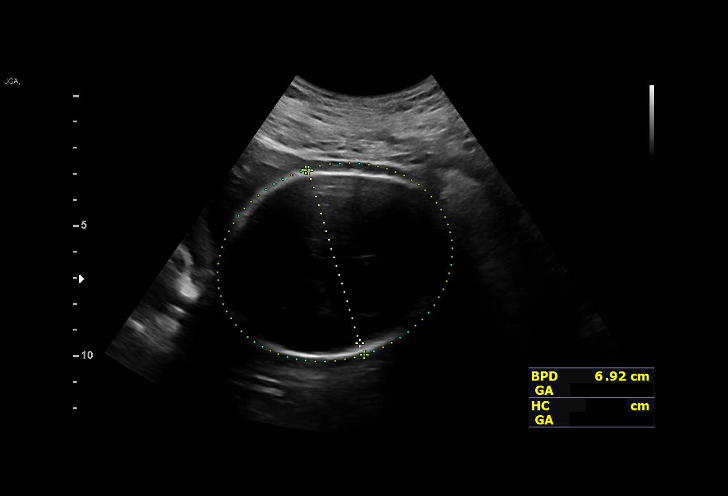
[im 14/29]
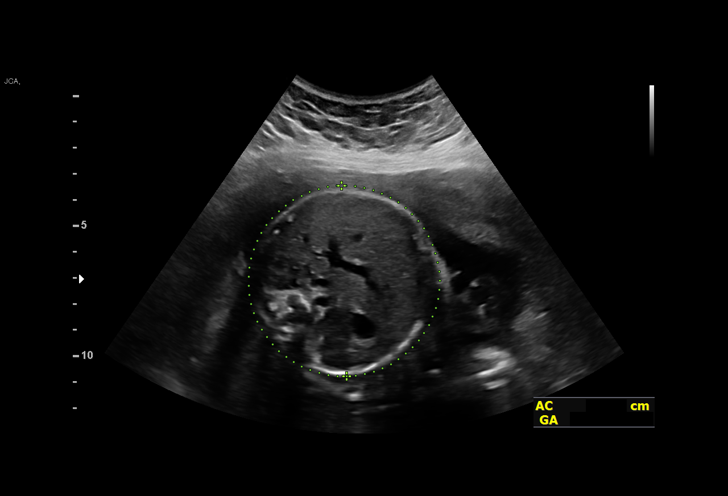
[im 16/29]
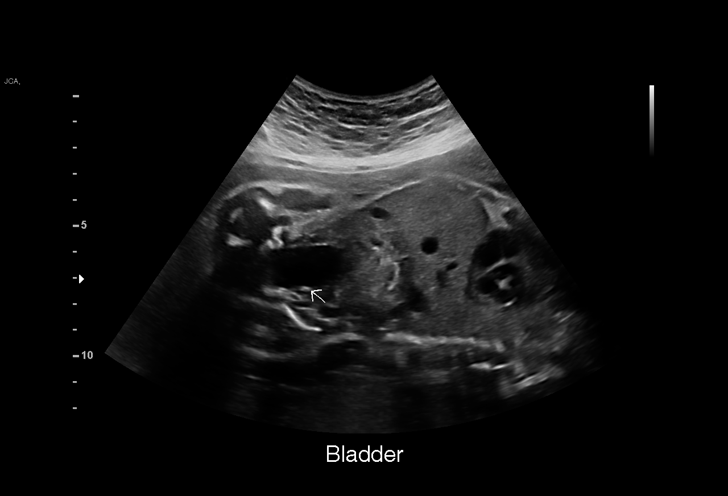
[im 18/29]
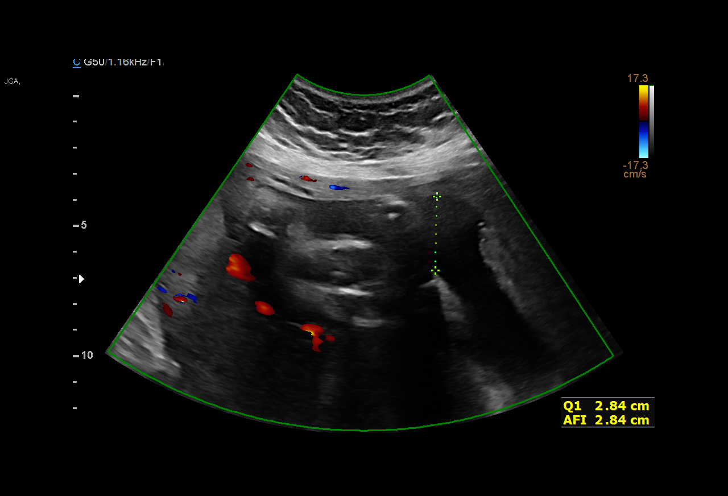
[im 20/29]
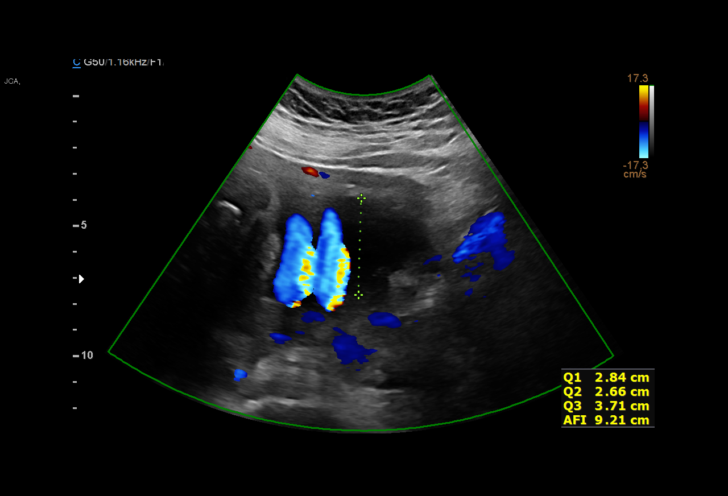
[im 22/29]
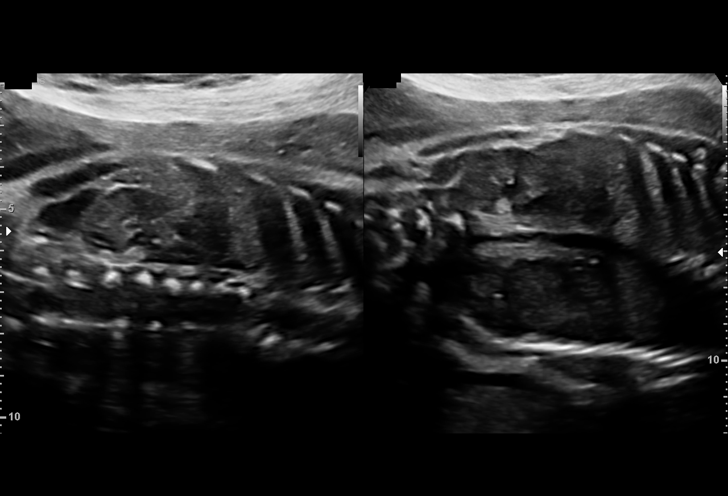
[im 24/29]
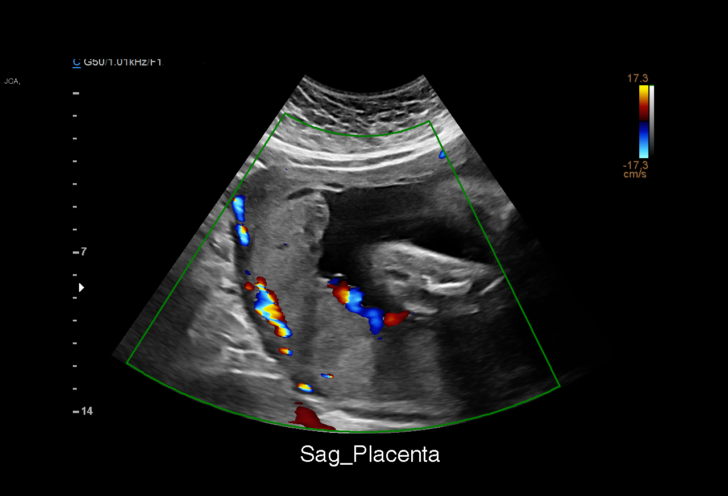
[im 26/29]
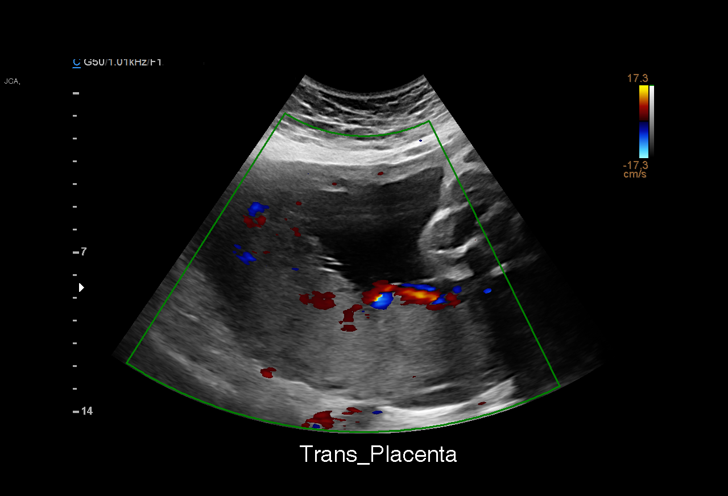
[im 29/29]
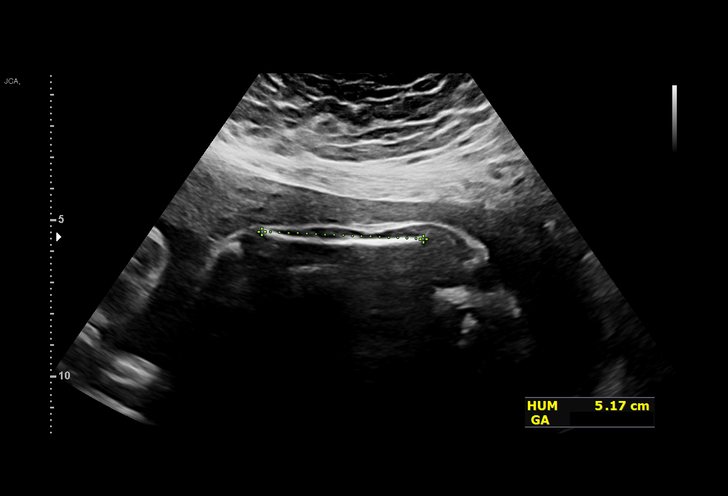

[14 of 28 positions shown; findings below may reference images not displayed]

[facility]
 Referred By:      [facility]/Triage

                                                      [facility]

Indications

 Late to prenatal care, third trimester          [JG]
 Short interval between pregancies, 3rd          [JG]
 trimester ([JG] old)
 28 weeks gestation of pregnancy
Fetal Evaluation

 Num Of Fetuses:          1
 Fetal Heart Rate(bpm):   141
 Cardiac Activity:        Observed
 Presentation:            Cephalic
 Placenta:                Posterior
 P. Cord Insertion:       Visualized

 Amniotic Fluid
 AFI FV:      Within normal limits

 AFI Sum(cm)     %Tile       Largest Pocket(cm)
 11.9            27

 RUQ(cm)       RLQ(cm)       LUQ(cm)        LLQ(cm)


 Comment:    No placental abruption or previa identified.
Biometry

 BPD:      69.2  mm     G. Age:  27w 6d         17  %    CI:        70.89   %    70 - 86
                                                         FL/HC:       22.2  %    19.6 -
 HC:      261.9  mm     G. Age:  28w 3d         17  %    HC/AC:       1.14       0.99 -
 AC:      229.7  mm     G. Age:  27w 2d         12  %    FL/BPD:      84.1  %    71 - 87
 FL:       58.2  mm     G. Age:  30w 3d         85  %    FL/AC:       25.3  %    20 - 24
 HUM:      51.7  mm     G. Age:  30w 1d         79  %
 Est. FW:    [JG]   gm   2 lb 12 oz      36  %
OB History

 Gravidity:    2         Term:   1        Prem:   0        SAB:   0
 TOP:          0       Ectopic:  0        Living: 1
Gestational Age

 U/S Today:     28w 4d                                        EDD:   [DATE]
 Best:          28w 4d     Det. By:  U/S ([DATE])           EDD:   [DATE]
Anatomy

 Cranium:               Appears normal         Stomach:                Appears normal, left
                                                                       sided
 Thoracic:              Appears normal         Kidneys:                Appear normal
 Diaphragm:             Appears normal         Bladder:                Appears normal
Comments

 This patient presented to the [facility] due to back pain.  She
 currently has a Mirena IUD in place.  A pregnancy test
 performed today was positive.

 On today's exam, a singleton intrauterine pregnancy is noted.

 The fetal biometry measurements obtained today are
 consistent with a fetus at 28 weeks and 4 days, giving her an
 EDC [DATE].

 The views of the fetal anatomy were not assessed on today's
 exam.

 She should be referred to the [HOSPITAL] within the next 2
 weeks for a detailed ultrasound exam.

## 2021-02-18 MED ORDER — METRONIDAZOLE 500 MG PO TABS: 500.0000 mg | ORAL_TABLET | Freq: Two times a day (BID) | ORAL | 0 refills | Status: DC

## 2021-02-18 NOTE — MAU Provider Note (Signed)
History     CSN: [id number]  Arrival date and time: 02/18/21 1610   Event Date/Time   First Provider Initiated Contact with Patient 02/18/21 1950      Chief Complaint  Patient presents with   Possible Pregnancy   Back Pain   HPI Carly Brooks is a 25 y.o. G1P0 at unknown gestation who presents to MAU from [facility] for evaluation of + HPT with IUD in place. Patient states she had her Mirena IUD placed at her postpartum visit about 11 months ago. She did not return to the office for a string check. She has not had a period. She endorses feeling "flutters" of fetal movement within the past week. She endorses low back pain. She denies vaginal bleeding, dysuria, fever or recent illness.  OB History     Gravida  1   Para      Term      Preterm      AB      Living         SAB      IAB      Ectopic      Multiple      Live Births              History reviewed. No pertinent past medical history.     Allergies: No Known Allergies  No medications prior to admission.    Review of Systems  Musculoskeletal:  Positive for back pain.  All other systems reviewed and are negative. Physical Exam   Blood pressure 133/78, pulse 81, temperature 98.4 F (36.9 C), resp. rate 18, height  (1.727 m), weight 122.9 kg, SpO2 100 %.  Physical Exam Vitals and nursing note reviewed. Exam conducted with a chaperone present.  Constitutional:      Appearance: Normal appearance. She is obese.  HENT:     Mouth/Throat:     Mouth: Mucous membranes are moist.  Cardiovascular:     Rate and Rhythm: Normal rate.     Pulses: Normal pulses.  Pulmonary:     Effort: Pulmonary effort is normal.     Breath sounds: Normal breath sounds.  Abdominal:     Comments: Gravid. Visible fetal outline, palpable via Leopold's.  Skin:    General: Skin is warm and dry.     Capillary Refill: Capillary refill takes less than 2 seconds.  Neurological:     Mental Status: She is alert and  oriented to person, place, and time.  Psychiatric:        Mood and Affect: Mood normal.        Behavior: Behavior normal.        Thought Content: Thought content normal.        Judgment: Judgment normal.    MAU Course  Procedures  --Patient states she cannot feel IUD strings, has never felt strings. Lengthy discussion with patient that IUD in place becomes less concerning given she is third trimester, can perform speculum exam to visualize strings but would not recommend removing IUD at current GA. Patient declines pelvic exam.  Orders Placed This Encounter  Procedures   Wet prep, genital   [place] MFM OB COMP + 14 WK   Urinalysis, Routine w reflex microscopic Urine, Clean Catch   CBC   Comprehensive metabolic panel   POC Urine Pregnancy, ED (not at [facility])   ABO/Rh   Discharge patient   Today's Vitals   02/18/21 1719 02/18/21 1844 02/18/21 1845 02/18/21 2022  BP:  133/78   Pulse:   81   Resp:   18   Temp:      TempSrc:   Oral   SpO2:   100%   Weight:   122.9 kg   Height:    (1.727 m)   PainSc: 0-No pain 3   0-No pain   Body mass index is 41.19 kg/m.  Recent Results (from the past 2160 hour(s))  POC Urine Pregnancy, ED (not at [facility])     Status: Abnormal   Collection Time: 02/18/21  5:50 PM  Result Value Ref Range   Preg Test, Ur POSITIVE (A) NEGATIVE    Comment:        THE SENSITIVITY OF THIS METHODOLOGY IS >24 mIU/mL   GC/Chlamydia probe amp ()not at [facility]     Status: None   Collection Time: 02/18/21  6:46 PM  Result Value Ref Range   Neisseria Gonorrhea Negative    Chlamydia Negative    Comment Normal Reference Ranger Chlamydia - Negative    Comment      Normal Reference Range Neisseria Gonorrhea - Negative  CBC     Status: Abnormal   Collection Time: 02/18/21  6:57 PM  Result Value Ref Range   WBC 10.4 4.0 - 10.5 K/uL   RBC 4.61 3.87 - 5.11 MIL/uL   Hemoglobin 11.0 (L) 12.0 - 15.0 g/dL   HCT 98.1 (L) 19.1 - 47.8 %   MCV 77.2 (L) 80.0 - 100.0 fL    MCH 23.9 (L) 26.0 - 34.0 pg   MCHC 30.9 30.0 - 36.0 g/dL   RDW 29.5 (H) 62.1 - 30.8 %   Platelets 332 150 - 400 K/uL   nRBC 0.0 0.0 - 0.2 %    Comment: Performed at [facility], [address] [place], [place] [address]  Comprehensive metabolic panel     Status: Abnormal   Collection Time: 02/18/21  6:57 PM  Result Value Ref Range   Sodium 133 (L) 135 - 145 mmol/L   Potassium 3.9 3.5 - 5.1 mmol/L   Chloride 105 98 - 111 mmol/L   CO2 20 (L) 22 - 32 mmol/L   Glucose, Bld 100 (H) 70 - 99 mg/dL    Comment: Glucose reference range applies only to samples taken after fasting for at least 8 hours.   BUN 5 (L) 6 - 20 mg/dL   Creatinine, Ser 6.96 0.44 - 1.00 mg/dL   Calcium 9.3 8.9 - 29.5 mg/dL   Total Protein 7.4 6.5 - 8.1 g/dL   Albumin 3.0 (L) 3.5 - 5.0 g/dL   AST 14 (L) 15 - 41 U/L   ALT 8 0 - 44 U/L   Alkaline Phosphatase 140 (H) 38 - 126 U/L   Total Bilirubin 0.4 0.3 - 1.2 mg/dL   GFR, Estimated >28 >41 mL/min    Comment: (NOTE) Calculated using the CKD-EPI Creatinine Equation (2021)    Anion gap 8 5 - 15    Comment: Performed at [facility], [address] [place], [place] [address]  ABO/Rh     Status: None   Collection Time: 02/18/21  6:57 PM  Result Value Ref Range   ABO/RH(D) O POS    No rh immune globuloin      NOT A RH IMMUNE GLOBULIN CANDIDATE, PT RH POSITIVE Performed at [facility], [address] [place], [place] [address]   Urinalysis, Routine w reflex microscopic     Status: None  Collection Time: 02/18/21  7:10 PM  Result Value Ref Range   Color, Urine YELLOW YELLOW   APPearance CLEAR CLEAR   Specific Gravity, Urine 1.014 1.005 - 1.030   pH 7.0 5.0 - 8.0   Glucose, UA NEGATIVE NEGATIVE mg/dL   Hgb urine dipstick NEGATIVE NEGATIVE   Bilirubin Urine NEGATIVE NEGATIVE   Ketones, ur NEGATIVE NEGATIVE mg/dL   Protein, ur NEGATIVE NEGATIVE mg/dL   Nitrite NEGATIVE NEGATIVE   Leukocytes,Ua NEGATIVE NEGATIVE    Comment: Performed at  [facility], [address] [place], [place] [address]  Wet prep, genital     Status: Abnormal   Collection Time: 02/18/21  7:10 PM  Result Value Ref Range   Yeast Wet Prep HPF POC NONE SEEN NONE SEEN   Trich, Wet Prep NONE SEEN NONE SEEN   Clue Cells Wet Prep HPF POC PRESENT (A) NONE SEEN   WBC, Wet Prep HPF POC <10 <10   Sperm NONE SEEN     Comment: Performed at [facility], [address] [place], [place] [address]   Narrative & Impression  ----------------------------------------------------------------------  OBSTETRICS REPORT                       (Signed Final 02/18/2021 10:10 pm) ---------------------------------------------------------------------- Patient Info  ID #:       [id number]                          D.O.B.:  [birth year] (25 yrs)  Name:       Carly Brooks            Visit Date: 02/18/2021 07:54 pm ---------------------------------------------------------------------- Performed By  Attending:        Ma Rings MD         Secondary Phy.:    Calvert Cantor  Performed By:     Sandi Mealy        [facility]  Referred By:      [facility] MAU/Triage ---------------------------------------------------------------------- Orders  #  Description                           Code        Ordered By  1  [place] MFM OB COMP + 14 WK                86754.49    Lelon Mast  Morgen Ritacco ----------------------------------------------------------------------  #  Order #                     Accession #                Episode #  1  [id number]                   [id number]                 [id number] ---------------------------------------------------------------------- Indications  Late to prenatal care, third trimester          O09.33  Short interval between pregancies,  3rd          O09.893  trimester (9month old)  [redacted] weeks gestation of pregnancy                 Z3A.28 ---------------------------------------------------------------------- Fetal Evaluation  Num Of Fetuses:          1  Fetal Heart Rate(bpm):   141  Cardiac Activity:        Observed  Presentation:            Cephalic  Placenta:                Posterior  P. Cord Insertion:       Visualized  Amniotic Fluid  AFI FV:      Within normal limits  AFI Sum(cm)     %Tile       Largest Pocket(cm)  11.9            27          2.9  RUQ(cm)       RLQ(cm)       LUQ(cm)        LLQ(cm)  2.8           2.7           2.7            3.7  Comment:    No placental abruption or previa identified. ---------------------------------------------------------------------- Biometry  BPD:      69.2  mm     G. Age:  27w 6d         17  %    CI:        70.89   %    70 - 86                                                          FL/HC:       22.2  %    19.6 - 20.8  HC:      261.9  mm     G. Age:  28w 3d         17  %    HC/AC:       1.14       0.99 - 1.21  AC:      229.7  mm     G. Age:  27w 2d         12  %    FL/BPD:      84.1  %    71 - 87  FL:       58.2  mm     G. Age:  30w 3d  85  %    FL/AC:       25.3  %    20 - 24  HUM:      51.7  mm     G. Age:  30w 1d         79  %  Est. FW:    1244   gm   2 lb 12 oz      36  % ---------------------------------------------------------------------- OB History  Gravidity:    2         Term:   1        Prem:   0        SAB:   0  TOP:          0       Ectopic:  0        Living: 1 ---------------------------------------------------------------------- Gestational Age  U/S Today:     28w 4d                                        EDD:   05/09/21  Best:          28w 4d     Det. By:  U/S (02/18/21)           EDD:   05/09/21 ---------------------------------------------------------------------- Anatomy  Cranium:               Appears normal         Stomach:                 Appears normal, left                                                                        sided  Thoracic:              Appears normal         Kidneys:                Appear normal  Diaphragm:             Appears normal         Bladder:                Appears normal ---------------------------------------------------------------------- Comments  This patient presented to the MAU due to back pain.  She  currently has a Mirena IUD in place.  A pregnancy test  performed today was positive.  On today's exam, a singleton intrauterine pregnancy is noted.  The fetal biometry measurements obtained today are  consistent with a fetus at 28 weeks and 4 days, giving her an  [facility] of May 09, 2021.  The views of the fetal anatomy were not assessed on today's  exam.  She should be referred to the MFM office within the next 2  weeks for a detailed ultrasound exam. ----------------------------------------------------------------------                   Ma Rings, MD Electronically Signed Final Report   02/18/2021 10:10 pm ----------------------------------------------------------------------    Meds ordered this encounter  Medications   metroNIDAZOLE (FLAGYL) 500 MG tablet  Sig: Take 1 tablet (500 mg total) by mouth 2 (two) times daily.    Dispense:  14 tablet    Refill:  0    Order Specific Question:   Supervising Provider    Answer:   Duane Lope H [[id number]]    Assessment and Plan  --25 y.o. G1P0 at 28w 0d by ultrasound performed today --FHT 152 by Doppler --Bacterial Vaginosis, rx to pharmacy --IUD not visible on [place], pelvic declined by patient --Discharge home in stable condition  F/U: --Given pregnancy verification letter, list of [facility] Providers with credentials at [facility], safe meds in pregnancy --Patient to choose Provider, establish care  Clayton Bibles, MSA, MSN, CNM Certified Nurse Midwife, [facility] for [employer], [facility]

## 2021-02-18 NOTE — Discharge Instructions (Signed)
Prenatal Care Providers           [facility]  [address] [phone]  [facility]   [address]  [phone]  [facility] [place]       [address] [phone]            [facility] [place]     [address] [phone]          [facility] [place]   [address] [phone]  [facility]  [address] [phone]     [facility]  [address]   [phone]     [facility]  Phone: [phone]  [facility]  Phone: [phone]  [facility] Phone: [phone]  [facility] Phone: [phone]  [facility] Phone: [phone]  [facility] Phone: [phone]  [facility]  Phone: [phone] Safe Medications in Pregnancy   Acne: Benzoyl Peroxide Salicylic Acid  Backache/Headache: Tylenol: 2 regular strength every 4 hours OR              2 Extra strength every 6 hours  Colds/Coughs/Allergies: Benadryl (alcohol free) 25 mg every 6 hours as needed Breath right strips Claritin Cepacol throat lozenges Chloraseptic throat spray Cold-Eeze- up to three times per day Cough drops, alcohol free Flonase (by prescription only) Guaifenesin Mucinex Robitussin DM (plain only, alcohol free) Saline nasal spray/drops Sudafed (pseudoephedrine) & Actifed ** use only after [redacted] weeks gestation and if you do not have high blood pressure Tylenol Vicks Vaporub Zinc lozenges Zyrtec   Constipation: Colace Ducolax suppositories Fleet enema Glycerin suppositories Metamucil Milk of magnesia Miralax Senokot Smooth move tea  Diarrhea: Kaopectate Imodium A-D  *NO pepto  Bismol  Hemorrhoids: Anusol Anusol HC Preparation H Tucks  Indigestion: Tums Maalox Mylanta Zantac  Pepcid  Insomnia: Benadryl (alcohol free) 25mg every 6 hours as needed Tylenol PM Unisom, no Gelcaps  Leg Cramps: Tums MagGel  Nausea/Vomiting:  Bonine Dramamine Emetrol Ginger extract Sea bands Meclizine  Nausea medication to take during pregnancy:  Unisom (doxylamine succinate 25 mg tablets) Take one tablet daily at bedtime. If symptoms are not adequately controlled, the dose can be increased to a maximum recommended dose of two tablets daily (1/2 tablet in the morning, 1/2 tablet mid-afternoon and one at bedtime). Vitamin B6 100mg tablets. Take one tablet twice a day (up to 200 mg per day).  Skin Rashes: Aveeno products Benadryl cream or 25mg every 6 hours as needed Calamine Lotion 1% cortisone cream  Yeast infection: Gyne-lotrimin 7 Monistat 7   **If taking multiple medications, please check labels to avoid duplicating the same active ingredients **take medication as directed on the label ** Do not exceed 4000 mg of tylenol in 24 hours **Do not take medications that contain aspirin or ibuprofen    

## 2021-02-18 NOTE — ED Provider Notes (Signed)
Emergency Medicine Provider Triage Evaluation Note  Carly Brooks , a 25 y.o. female  was evaluated in triage.  Pt complains of vaginal cramping and bleeding.  She states that since Thursday of last week she has had vaginal bleeding up until Monday when it has been intermittent.  She has also had some abdominal and back cramping.  She states that she took a pregnancy test today which was positive.  She states that she had a Mirena placed in January and has not had a period since then.  She was prompted to take a pregnancy test which she states that she felt like fluttering in her belly and thought it was gas however continued.  She is no prenatal care at this point.  Review of Systems  Positive: See above Negative:   Physical Exam  BP 140/74 (BP Location: Right Arm)   Pulse 98   Temp 98.4 F (36.9 C)   Resp 18   LMP 03/31/2020   SpO2 100%  Gen:   Awake, no distress   Resp:  Normal effort  MSK:   Moves extremities without difficulty  Other:  FHT in triage 138   Medical Decision Making  Medically screening exam initiated at 5:46 PM.  Appropriate orders placed.  Cranford Mon was informed that the remainder of the evaluation will be completed by another provider, this initial triage assessment does not replace that evaluation, and the importance of remaining in the ED until their evaluation is complete.  Spoke with APP in MAU who accepts patient    Lenard Galloway 02/18/21 1800    Arby Barrette, MD 02/18/21 [phone]

## 2021-02-18 NOTE — MAU Note (Signed)
Has Mirena for 11 months, placed after delivery. Hasn't had period.  Thought it was gas, been having back pain. Has been feeling flutters for about a wk. +HPT today. No bleeding.

## 2021-02-18 NOTE — ED Triage Notes (Signed)
Patient with morena x11 months complains of vaginal bleeding since Monday and a positive pregnancy test today. Denies abdominal pain, changing panty liner twice per day. Patient alert, oriented, and in no apparent distress at this time. G1P1, possibly also pregnant today.

## 2021-02-21 LAB — GC/CHLAMYDIA PROBE AMP (~~LOC~~) NOT AT ARMC
Chlamydia: NEGATIVE
Comment: NEGATIVE
Comment: NORMAL
Neisseria Gonorrhea: NEGATIVE

## 2021-02-24 ENCOUNTER — Telehealth: Payer: Self-pay | Admitting: *Deleted

## 2021-02-24 NOTE — Telephone Encounter (Signed)
Returned call from 3:02 PM. Left a message that office is returning her call and that she has an appointment scheduled with [facility] on 03/10/2021 and to keep that appointment.

## 2021-03-10 ENCOUNTER — Ambulatory Visit (INDEPENDENT_AMBULATORY_CARE_PROVIDER_SITE_OTHER): Payer: Medicare (Managed Care) | Admitting: Family Medicine

## 2021-03-10 ENCOUNTER — Other Ambulatory Visit: Payer: Self-pay

## 2021-03-10 ENCOUNTER — Other Ambulatory Visit (HOSPITAL_COMMUNITY)
Admission: RE | Admit: 2021-03-10 | Discharge: 2021-03-10 | Disposition: A | Discharge status: Home / Self Care | Payer: Medicare (Managed Care) | Source: Ambulatory Visit | Attending: Family Medicine | Admitting: Family Medicine

## 2021-03-10 ENCOUNTER — Encounter: Payer: Self-pay | Admitting: Family Medicine

## 2021-03-10 VITALS — BP 106/74 | HR 81 | Wt 275.0 lb

## 2021-03-10 DIAGNOSIS — Z3480 Encounter for supervision of other normal pregnancy, unspecified trimester: Secondary | ICD-10-CM

## 2021-03-10 DIAGNOSIS — Z3490 Encounter for supervision of normal pregnancy, unspecified, unspecified trimester: Secondary | ICD-10-CM | POA: Diagnosis present

## 2021-03-10 DIAGNOSIS — Z98891 History of uterine scar from previous surgery: Secondary | ICD-10-CM | POA: Insufficient documentation

## 2021-03-10 DIAGNOSIS — O0933 Supervision of pregnancy with insufficient antenatal care, third trimester: Secondary | ICD-10-CM | POA: Diagnosis not present

## 2021-03-10 DIAGNOSIS — Z23 Encounter for immunization: Secondary | ICD-10-CM

## 2021-03-10 DIAGNOSIS — Z3A31 31 weeks gestation of pregnancy: Secondary | ICD-10-CM | POA: Diagnosis not present

## 2021-03-10 DIAGNOSIS — O0930 Supervision of pregnancy with insufficient antenatal care, unspecified trimester: Secondary | ICD-10-CM | POA: Diagnosis present

## 2021-03-10 DIAGNOSIS — Z6841 Body Mass Index (BMI) 40.0 and over, adult: Secondary | ICD-10-CM

## 2021-03-10 NOTE — Progress Notes (Signed)
Subjective:  Carly Brooks is a G2P1001 [redacted]w[redacted]d being seen today for her first obstetrical visit.  Her obstetrical history is significant for previous cesarean delivery for arrest of dilation approximately a year ago. Medical complications of BMI of 41. Patient does intend to breast feed. Pregnancy history fully reviewed.  Patient reports no complaints.  BP 106/74    Pulse 81    Wt 275 lb (124.7 kg)    LMP 08/02/2020    BMI 41.81 kg/m   HISTORY: OB History  Gravida Para Term Preterm AB Living  2 1 1     1   SAB IAB Ectopic Multiple Live Births          1    # Outcome Date GA Lbr Len/2nd Weight Sex Delivery Anes PTL Lv  2 Current           1 Term 2021 [redacted]w[redacted]d   F CS-LTranv EPI N LIV    History reviewed. No pertinent past medical history.  Past Surgical History:  Procedure Laterality Date   CESAREAN SECTION      No family history on file.   Exam  BP 106/74    Pulse 81    Wt 275 lb (124.7 kg)    LMP 08/02/2020    BMI 41.81 kg/m   Chaperone present during exam  CONSTITUTIONAL: Well-developed, well-nourished female in no acute distress.  HENT:  Normocephalic, atraumatic, External right and left ear normal. Oropharynx is clear and moist EYES: Conjunctivae and EOM are normal. Pupils are equal, round, and reactive to light. No scleral icterus.  NECK: Normal range of motion, supple, no masses.  Normal thyroid.  CARDIOVASCULAR: Normal heart rate noted, regular rhythm RESPIRATORY: Clear to auscultation bilaterally. Effort and breath sounds normal, no problems with respiration noted. BREASTS: Symmetric in size. No masses, skin changes, nipple drainage, or lymphadenopathy. ABDOMEN: Soft, normal bowel sounds, no distention noted.  No tenderness, rebound or guarding.  PELVIC: Normal appearing external genitalia; normal appearing vaginal mucosa and cervix. No abnormal discharge noted. Normal uterine size, no other palpable masses, no uterine or adnexal tenderness. MUSCULOSKELETAL:  Normal range of motion. No tenderness.  No cyanosis, clubbing, or edema.  2+ distal pulses. SKIN: Skin is warm and dry. No rash noted. Not diaphoretic. No erythema. No pallor. NEUROLOGIC: Alert and oriented to person, place, and time. Normal reflexes, muscle tone coordination. No cranial nerve deficit noted. PSYCHIATRIC: Normal mood and affect. Normal behavior. Normal judgment and thought content.    Assessment:    Pregnancy: G2P1001 Patient Active Problem List   Diagnosis Date Noted   Supervision of other normal pregnancy, antepartum 03/10/2021   Late prenatal care 03/10/2021   History of cesarean delivery 03/10/2021      Plan:   1. Supervision of other normal pregnancy, antepartum Initial labs obtained Continue prenatal vitamins Reviewed n/v relief measures and warning s/s to report Reviewed recommended weight gain based on pre-gravid BMI Encouraged well-balanced diet Genetic & carrier screening discussed: requests Panorama,  Ultrasound discussed; fetal survey: requested CCNC completed> form faxed if has or is planning to apply for medicaid The nature of [facility] for 03/12/2021 with multiple MDs and other Advanced Practice Providers was explained to patient; also emphasized that fellows, residents, and students are part of our team. - CBC/D/Plt+RPR+Rh+ABO+RubIgG... - [employer] MFM OB DETAIL +14 WK; Future - Culture, OB Urine - Hemoglobin A1c - Flu Vaccine QUAD 36+ mos IM (Fluarix, Quad PF) - Tdap vaccine greater than or equal to 7yo  IM - CHL AMB BABYSCRIPTS OPT IN - Glucose tolerance, 1 hour - Cytology - PAP( [place])  2. Late prenatal care - CBC/D/Plt+RPR+Rh+ABO+RubIgG... - [place] MFM OB DETAIL +14 WK; Future - Culture, OB Urine - Hemoglobin A1c - Flu Vaccine QUAD 36+ mos IM (Fluarix, Quad PF) - Tdap vaccine greater than or equal to 7yo IM - CHL AMB BABYSCRIPTS OPT IN - Glucose tolerance, 1 hour - Cytology - PAP( [place])  3. History of cesarean  delivery Desires repeat cesarean delivery with BTL. - CBC/D/Plt+RPR+Rh+ABO+RubIgG... - [place] MFM OB DETAIL +14 WK; Future - Culture, OB Urine - Hemoglobin A1c - Flu Vaccine QUAD 36+ mos IM (Fluarix, Quad PF) - Tdap vaccine greater than or equal to 7yo IM - CHL AMB BABYSCRIPTS OPT IN - Glucose tolerance, 1 hour - Cytology - PAP( [place])  4. BMI 40.0-44.9, adult [facility]) Although limited utility starting ASA 81mg  this late, I still recommended it.   Problem list reviewed and updated. 75% of 30 min visit spent on counseling and coordination of care.     03/10/2021

## 2021-03-11 LAB — CBC/D/PLT+RPR+RH+ABO+RUBIGG...
Antibody Screen: NEGATIVE
Basophils Absolute: 0 10*3/uL (ref 0.0–0.2)
Basos: 0 %
EOS (ABSOLUTE): 0.1 10*3/uL (ref 0.0–0.4)
Eos: 1 %
HCV Ab: 0.1 s/co ratio (ref 0.0–0.9)
HIV Screen 4th Generation wRfx: NONREACTIVE
Hematocrit: 32.3 % — ABNORMAL LOW (ref 34.0–46.6)
Hemoglobin: 10.4 g/dL — ABNORMAL LOW (ref 11.1–15.9)
Hepatitis B Surface Ag: NEGATIVE
Immature Grans (Abs): 0.1 10*3/uL (ref 0.0–0.1)
Immature Granulocytes: 1 %
Lymphocytes Absolute: 1.7 10*3/uL (ref 0.7–3.1)
Lymphs: 17 %
MCH: 23.9 pg — ABNORMAL LOW (ref 26.6–33.0)
MCHC: 32.2 g/dL (ref 31.5–35.7)
MCV: 74 fL — ABNORMAL LOW (ref 79–97)
Monocytes Absolute: 0.7 10*3/uL (ref 0.1–0.9)
Monocytes: 8 %
Neutrophils Absolute: 7 10*3/uL (ref 1.4–7.0)
Neutrophils: 73 %
Platelets: 300 10*3/uL (ref 150–450)
RBC: 4.36 x10E6/uL (ref 3.77–5.28)
RDW: 15.4 % (ref 11.7–15.4)
RPR Ser Ql: NONREACTIVE
Rh Factor: POSITIVE
Rubella Antibodies, IGG: 2.1 index (ref >0.99)
WBC: 9.5 10*3/uL (ref 3.4–10.8)

## 2021-03-11 LAB — HEMOGLOBIN A1C
Est. average glucose Bld gHb Est-mCnc: 117 mg/dL
Hgb A1c MFr Bld: 5.7 % — ABNORMAL HIGH (ref 4.8–5.6)

## 2021-03-11 LAB — GLUCOSE TOLERANCE, 1 HOUR: Glucose, 1Hr PP: 86 mg/dL (ref 70–199)

## 2021-03-11 LAB — HCV INTERPRETATION

## 2021-03-13 LAB — URINE CULTURE, OB REFLEX

## 2021-03-13 LAB — CULTURE, OB URINE

## 2021-03-15 LAB — CYTOLOGY - PAP
Chlamydia: NEGATIVE
Comment: NEGATIVE
Comment: NORMAL
Diagnosis: NEGATIVE
Neisseria Gonorrhea: NEGATIVE

## 2021-03-28 ENCOUNTER — Ambulatory Visit (INDEPENDENT_AMBULATORY_CARE_PROVIDER_SITE_OTHER): Payer: Medicare (Managed Care) | Admitting: Obstetrics & Gynecology

## 2021-03-28 ENCOUNTER — Other Ambulatory Visit: Payer: Self-pay

## 2021-03-28 ENCOUNTER — Encounter: Payer: Self-pay | Admitting: Obstetrics & Gynecology

## 2021-03-28 VITALS — BP 107/70 | HR 84 | Wt 278.0 lb

## 2021-03-28 DIAGNOSIS — Z3480 Encounter for supervision of other normal pregnancy, unspecified trimester: Secondary | ICD-10-CM

## 2021-03-28 DIAGNOSIS — Z98891 History of uterine scar from previous surgery: Secondary | ICD-10-CM

## 2021-03-28 DIAGNOSIS — Z3A34 34 weeks gestation of pregnancy: Secondary | ICD-10-CM

## 2021-03-28 DIAGNOSIS — O99210 Obesity complicating pregnancy, unspecified trimester: Secondary | ICD-10-CM

## 2021-03-28 DIAGNOSIS — O26843 Uterine size-date discrepancy, third trimester: Secondary | ICD-10-CM

## 2021-03-28 DIAGNOSIS — Z302 Encounter for sterilization: Secondary | ICD-10-CM

## 2021-03-28 NOTE — Patient Instructions (Signed)
Return to office for any scheduled appointments. Call the office or go to the MAU at [facility] if:  You begin to have strong, frequent contractions  Your water breaks.  Sometimes it is a big gush of fluid, sometimes it is just a trickle that keeps getting your panties wet or running down your legs  You have vaginal bleeding.  It is normal to have a small amount of spotting if your cervix was checked.   You do not feel your baby moving like normal.  If you do not, get something to eat and drink and lay down and focus on feeling your baby move.   If your baby is still not moving like normal, you should call the office or go to MAU.  Any other obstetric concerns.   

## 2021-03-28 NOTE — Progress Notes (Signed)
Patient signed tubal consent form today. Kathrene Alu RN

## 2021-03-28 NOTE — Progress Notes (Signed)
° °  PRENATAL VISIT NOTE  Subjective:  Carly Brooks is a 26 y.o. G2P1001 at [redacted]w[redacted]d being seen today for ongoing prenatal care.  She is currently monitored for the following issues for this low-risk pregnancy and has Supervision of other normal pregnancy, antepartum; Late prenatal care; and History of cesarean delivery on their problem list.  Patient reports no complaints.  Contractions: Not present. Vag. Bleeding: None.  Movement: Present. Denies leaking of fluid.   The following portions of the patient's history were reviewed and updated as appropriate: allergies, current medications, past family history, past medical history, past social history, past surgical history and problem list.   Objective:   Vitals:   03/28/21 1055  BP: 107/70  Pulse: 84  Weight: 278 lb (126.1 kg)    Fetal Status: Fetal Heart Rate (bpm): 138 Fundal Height: 38 cm Movement: Present     General:  Alert, oriented and cooperative. Patient is in no acute distress.  Skin: Skin is warm and dry. No rash noted.   Cardiovascular: Normal heart rate noted  Respiratory: Normal respiratory effort, no problems with respiration noted  Abdomen: Soft, gravid, appropriate for gestational age.  Pain/Pressure: Absent     Pelvic: Cervical exam deferred        Extremities: Normal range of motion.  Edema: None  Mental Status: Normal mood and affect. Normal behavior. Normal judgment and thought content.   Assessment and Plan:  Pregnancy: G2P1001 at [redacted]w[redacted]d 1. Significant discrepancy between uterine size and clinical dates, antepartum, third trimester 2. Maternal morbid obesity, antepartum (HCC) Increased fundal height, could be secondary to habitus.  She has ultrasound on 04/06/21, will follow up results and manage accordingly.  3. History of cesarean delivery 4. Request for sterilization She is adamant about RCS and BTL, surgical request inbasket message sent. Was given information to review about TOLAC in case she  changes her mind.  5. [redacted] weeks gestation of pregnancy 6. Supervision of other normal pregnancy, antepartum Preterm labor symptoms and general obstetric precautions including but not limited to vaginal bleeding, contractions, leaking of fluid and fetal movement were reviewed in detail with the patient. Please refer to After Visit Summary for other counseling recommendations.   Return in about 2 weeks (around 04/11/2021) for OFFICE OB VISIT (MD or APP), Pelvic cultures.  Future Appointments  Date Time Provider Department Center  04/06/2021  1:15 PM [facility] NURSE [facility]  04/06/2021  1:30 PM [facility] US3 [facility]  04/11/2021  9:55 AM Milas Hock, MD [facility] None    Jaynie Collins, MD

## 2021-03-31 ENCOUNTER — Encounter: Payer: Self-pay | Admitting: Obstetrics & Gynecology

## 2021-04-06 ENCOUNTER — Ambulatory Visit: Payer: Medicare (Managed Care)

## 2021-04-07 ENCOUNTER — Ambulatory Visit: Payer: Medicare (Managed Care) | Attending: Family Medicine | Admitting: *Deleted

## 2021-04-07 ENCOUNTER — Other Ambulatory Visit: Payer: Self-pay | Admitting: Family Medicine

## 2021-04-07 ENCOUNTER — Encounter: Payer: Self-pay | Admitting: *Deleted

## 2021-04-07 ENCOUNTER — Ambulatory Visit: Payer: Medicare (Managed Care) | Admitting: *Deleted

## 2021-04-07 ENCOUNTER — Ambulatory Visit (HOSPITAL_BASED_OUTPATIENT_CLINIC_OR_DEPARTMENT_OTHER): Payer: Medicare (Managed Care)

## 2021-04-07 ENCOUNTER — Other Ambulatory Visit: Payer: Self-pay | Admitting: *Deleted

## 2021-04-07 ENCOUNTER — Other Ambulatory Visit: Payer: Self-pay

## 2021-04-07 VITALS — BP 103/67 | HR 90

## 2021-04-07 DIAGNOSIS — O0930 Supervision of pregnancy with insufficient antenatal care, unspecified trimester: Secondary | ICD-10-CM | POA: Diagnosis not present

## 2021-04-07 DIAGNOSIS — O09893 Supervision of other high risk pregnancies, third trimester: Secondary | ICD-10-CM

## 2021-04-07 DIAGNOSIS — O09293 Supervision of pregnancy with other poor reproductive or obstetric history, third trimester: Secondary | ICD-10-CM | POA: Insufficient documentation

## 2021-04-07 DIAGNOSIS — Z98891 History of uterine scar from previous surgery: Secondary | ICD-10-CM

## 2021-04-07 DIAGNOSIS — Z3A35 35 weeks gestation of pregnancy: Secondary | ICD-10-CM | POA: Diagnosis not present

## 2021-04-07 DIAGNOSIS — O99213 Obesity complicating pregnancy, third trimester: Secondary | ICD-10-CM | POA: Diagnosis present

## 2021-04-07 DIAGNOSIS — Z363 Encounter for antenatal screening for malformations: Secondary | ICD-10-CM | POA: Diagnosis not present

## 2021-04-07 DIAGNOSIS — O0933 Supervision of pregnancy with insufficient antenatal care, third trimester: Secondary | ICD-10-CM | POA: Insufficient documentation

## 2021-04-07 DIAGNOSIS — N858 Other specified noninflammatory disorders of uterus: Secondary | ICD-10-CM | POA: Insufficient documentation

## 2021-04-07 DIAGNOSIS — Z3480 Encounter for supervision of other normal pregnancy, unspecified trimester: Secondary | ICD-10-CM

## 2021-04-07 DIAGNOSIS — O34219 Maternal care for unspecified type scar from previous cesarean delivery: Secondary | ICD-10-CM | POA: Insufficient documentation

## 2021-04-07 DIAGNOSIS — E669 Obesity, unspecified: Secondary | ICD-10-CM | POA: Insufficient documentation

## 2021-04-07 IMAGING — US US MFM OB DETAIL+14 WK
1 series · 13 of 28 positions shown · non-contrast
Comparison: none

[Series 1: us mfm ob detail+14 wk · 13 of 102 slices shown]
[im 4/102]
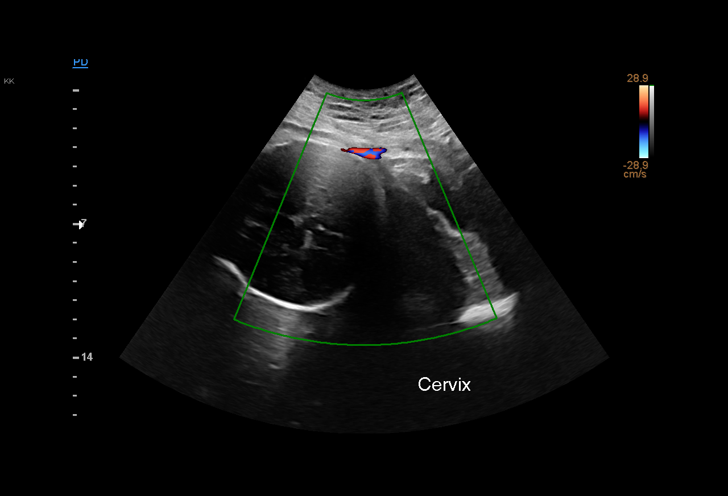
[im 12/102]
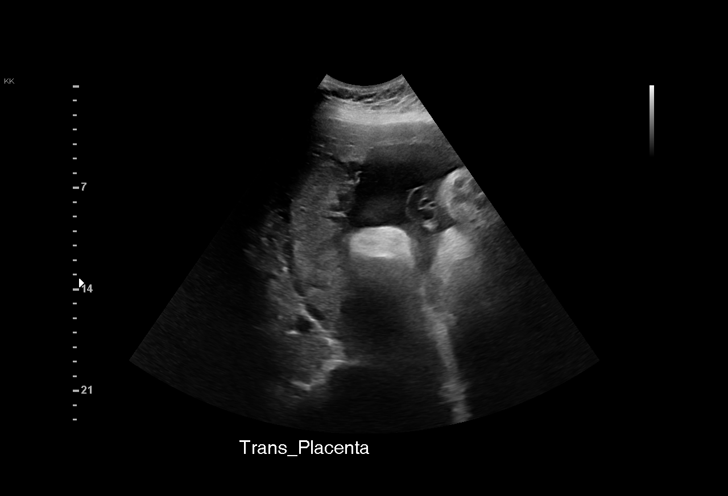
[im 19/102]
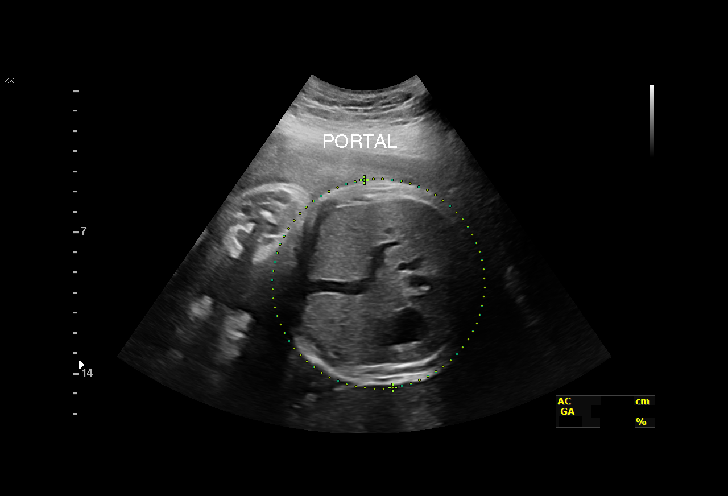
[im 27/102]
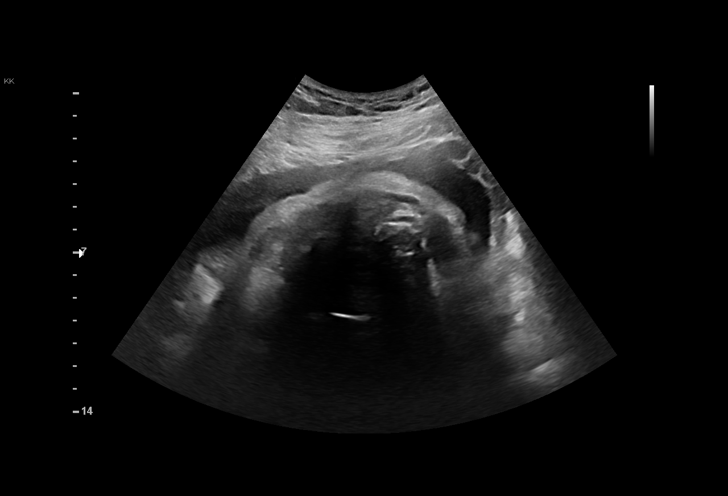
[im 34/102]
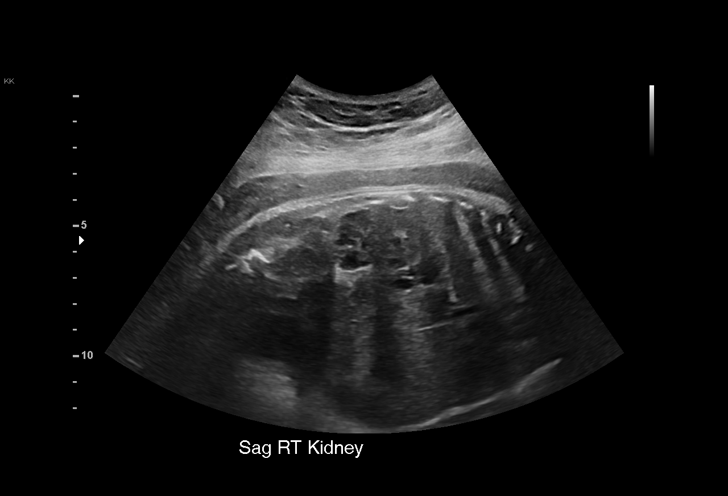
[im 42/102]
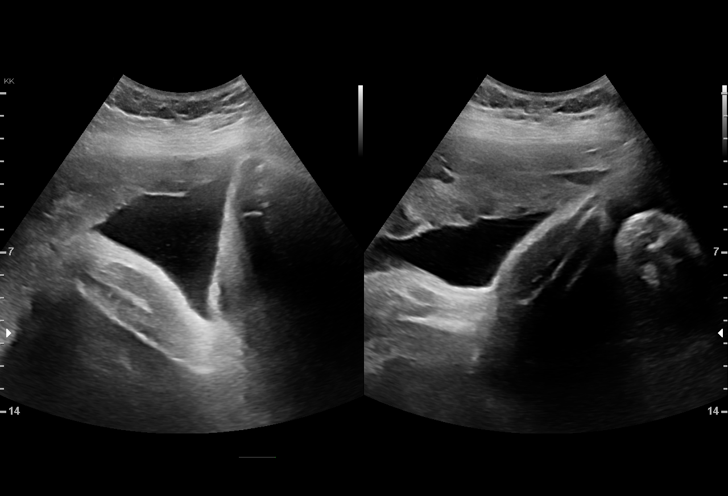
[im 53/102]
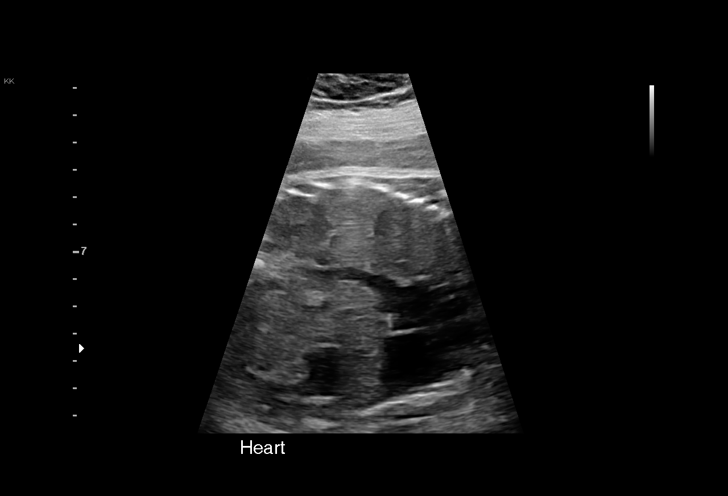
[im 60/102]
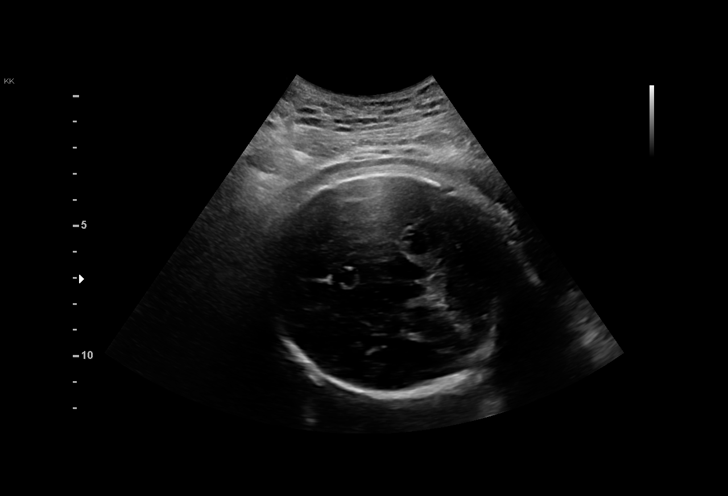
[im 68/102]
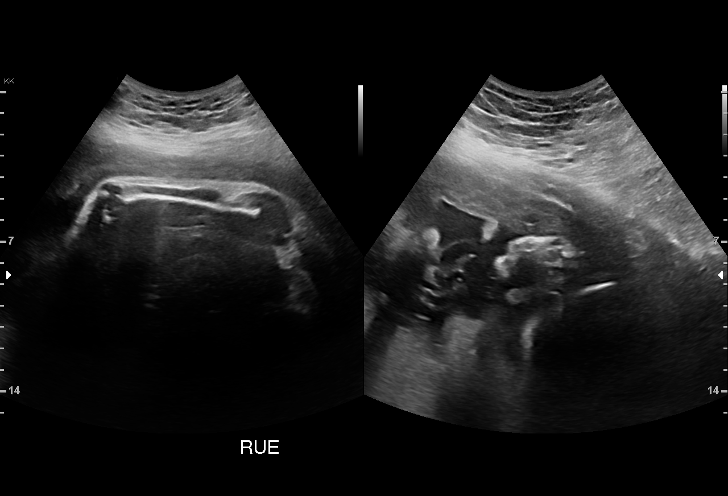
[im 75/102]
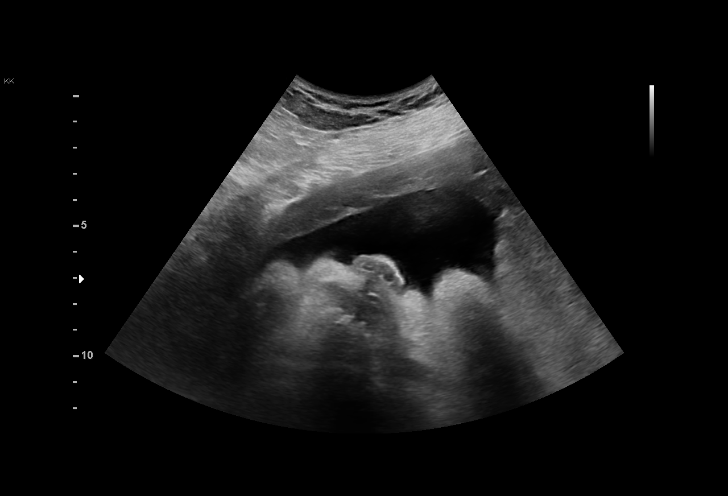
[im 83/102]
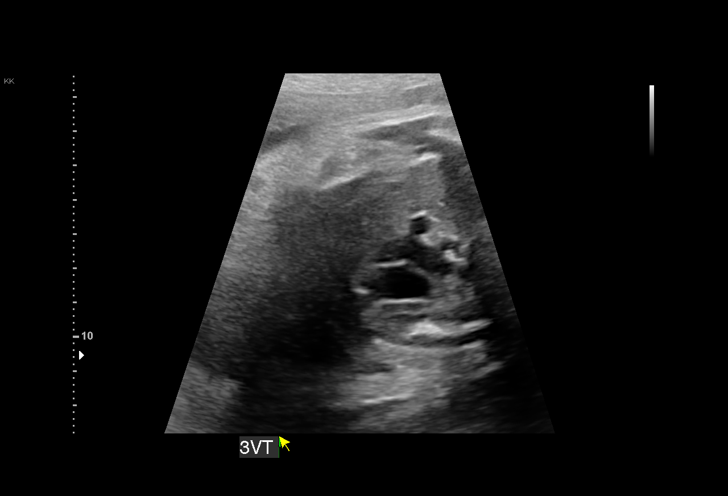
[im 90/102]
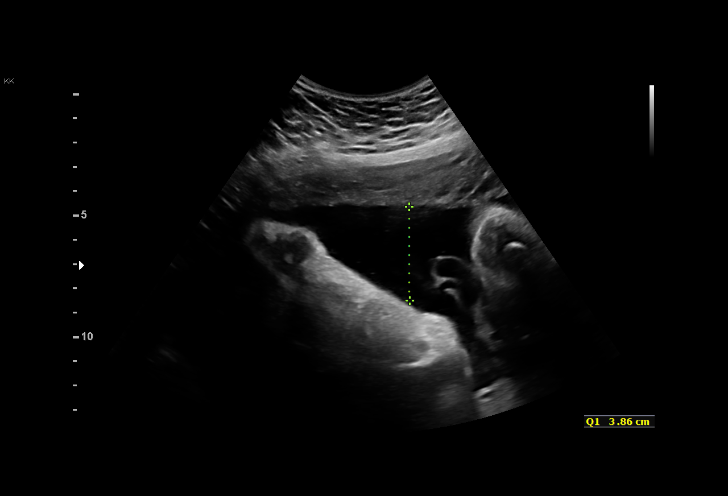
[im 98/102]
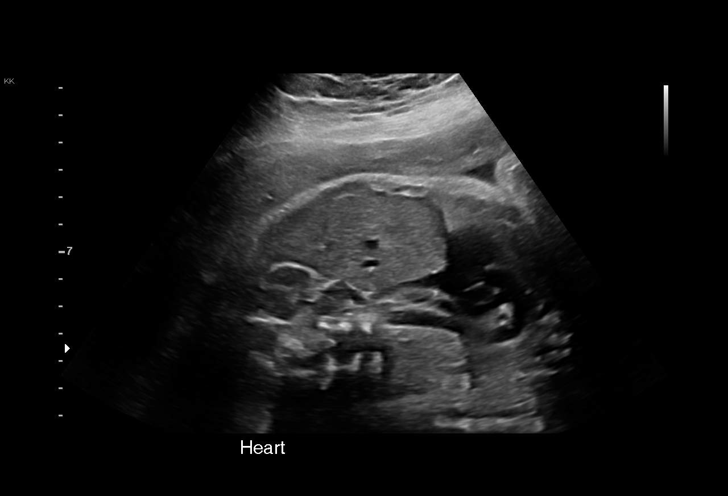

[13 of 28 positions shown; findings below may reference images not displayed]

[id number]

    W/NONSTRESS

Indications

 Obesity complicating pregnancy, third
 trimester (BMI 41)
 35 weeks gestation of pregnancy
 Encounter for antenatal screening for
 malformations
 Late to prenatal care, third trimester
 History of cesarean delivery, currently
 pregnant
 Short interval between pregancies, 3rd
 trimester
Fetal Evaluation

 Num Of Fetuses:         1
 Fetal Heart Rate(bpm):  144
 Cardiac Activity:       Observed
 Presentation:           Cephalic
 Placenta:               Posterior
 P. Cord Insertion:      Previously Visualized
 Amniotic Fluid
 AFI FV:      Within normal limits

 AFI Sum(cm)     %Tile       Largest Pocket(cm)
 13.2            45

 RUQ(cm)       RLQ(cm)       LUQ(cm)        LLQ(cm)
 3.9           4.1           2.2            3
Biophysical Evaluation

 Amniotic F.V:   Pocket => 2 cm             F. Tone:        Observed
 F. Movement:    Observed                   N.S.T:          Reactive
 F. Breathing:   Not Observed               Score:          [DATE]
Biometry

 BPD:      85.1  mm     G. Age:  34w 2d         23  %    CI:        77.77   %    70 - 86
                                                         FL/HC:      23.2   %    20.1 -
 HC:      305.4  mm     G. Age:  34w 0d        2.7  %    HC/AC:      0.94        0.93 -
 AC:      326.6  mm     G. Age:  36w 4d         86  %    FL/BPD:     83.4   %    71 - 87
 FL:         71  mm     G. Age:  36w 3d         69  %    FL/AC:      21.7   %    20 - 24
 HUM:      64.1  mm     G. Age:  37w 1d       > 95  %
 Est. FW:    5208  gm      6 lb 3 oz     65  %
OB History

 Gravidity:    2         Term:   1        Prem:   0        SAB:   0
 TOP:          0       Ectopic:  0        Living: 1
Gestational Age

 U/S Today:     35w 2d                                        EDD:   05/10/21
 Best:          35w 3d     Det. By:  U/S  (02/18/21)          EDD:   05/09/21
Anatomy

 Cranium:               Appears normal         Aortic Arch:            Appears normal
 Cavum:                 Appears normal         Ductal Arch:            Not well visualized
 Ventricles:            Appears normal         Diaphragm:              Appears normal
 Choroid Plexus:        Appears normal         Stomach:                Appears normal, left
                                                                       sided
 Cerebellum:            Not well visualized    Abdomen:                Appears normal
 Posterior Fossa:       Not well visualized    Abdominal Wall:         Not well visualized
 Nuchal Fold:           Not applicable (>20    Cord Vessels:           Appears normal (3
                        wks GA)                                        vessel cord)
 Face:                  Orbits appear          Kidneys:                Appear normal
                        normal
 Lips:                  Appears normal         Bladder:                Appears normal
 Thoracic:              Appears normal         Spine:                  Limited views
                                                                       appear normal
 Heart:                 Appears normal         Upper Extremities:      Not well visualized
                        (4CH, axis, and
                        situs)
 RVOT:                  Appears normal         Lower Extremities:      Appears normal
 LVOT:                  Not well visualized

 Other:  Technicallly difficult due to advanced GA and maternal habitus.
Cervix Uterus Adnexa

 Cervix
 Not visualized (advanced GA >51wks)
Impression

 Follow up growth due to prior IUGR
 Normal interval growth with measurements consistent with
 dates
 Good fetal movement and amniotic fluid volume
 Biophysical profile [DATE]
 Suboptimal views were obtained secondary to fetal position.
Recommendations

 Continue weekly testing
 Repeat growth in 4 weeks.

## 2021-04-07 NOTE — Procedures (Signed)
Carly Brooks [birth year] [redacted]w[redacted]d  Fetus A Non-Stress Test Interpretation for 04/07/21  Indication: Unsatisfactory BPP  Fetal Heart Rate A Mode: External Baseline Rate (A): 145 bpm Variability: Moderate Accelerations: 15 x 15 Decelerations: None Multiple birth?: No  Uterine Activity Mode: Palpation, Toco Contraction Frequency (min): none Resting Tone Palpated: Relaxed  Interpretation (Fetal Testing) Nonstress Test Interpretation: Reactive Overall Impression: Reassuring for gestational age Comments: Dr. Grace Bushy reviewed tracing

## 2021-04-11 ENCOUNTER — Telehealth: Payer: Self-pay | Admitting: Obstetrics and Gynecology

## 2021-04-11 ENCOUNTER — Encounter: Payer: Self-pay | Admitting: Obstetrics and Gynecology

## 2021-04-11 ENCOUNTER — Other Ambulatory Visit: Payer: Self-pay

## 2021-04-11 ENCOUNTER — Ambulatory Visit (INDEPENDENT_AMBULATORY_CARE_PROVIDER_SITE_OTHER): Payer: Medicare (Managed Care) | Admitting: Obstetrics and Gynecology

## 2021-04-11 ENCOUNTER — Other Ambulatory Visit (HOSPITAL_COMMUNITY)
Admission: RE | Admit: 2021-04-11 | Discharge: 2021-04-11 | Disposition: A | Discharge status: Home / Self Care | Payer: Medicare (Managed Care) | Source: Ambulatory Visit | Attending: Obstetrics and Gynecology | Admitting: Obstetrics and Gynecology

## 2021-04-11 VITALS — BP 106/62 | HR 91 | Wt 280.0 lb

## 2021-04-11 DIAGNOSIS — Z3A36 36 weeks gestation of pregnancy: Secondary | ICD-10-CM

## 2021-04-11 DIAGNOSIS — F32A Depression, unspecified: Secondary | ICD-10-CM

## 2021-04-11 DIAGNOSIS — Z98891 History of uterine scar from previous surgery: Secondary | ICD-10-CM

## 2021-04-11 DIAGNOSIS — Z3480 Encounter for supervision of other normal pregnancy, unspecified trimester: Secondary | ICD-10-CM

## 2021-04-11 DIAGNOSIS — O0930 Supervision of pregnancy with insufficient antenatal care, unspecified trimester: Secondary | ICD-10-CM

## 2021-04-11 MED ORDER — SERTRALINE HCL 50 MG PO TABS: 50.0000 mg | ORAL_TABLET | Freq: Every day | ORAL | 1 refills | Status: DC

## 2021-04-11 NOTE — Telephone Encounter (Signed)
Attempted to reach patient about scheduling an appointment. Left a voice message for her to call the office.

## 2021-04-11 NOTE — Progress Notes (Signed)
° °  PRENATAL VISIT NOTE  Subjective:  Carly Brooks is a 26 y.o. G2P1001 at [redacted]w[redacted]d being seen today for ongoing prenatal care.  She is currently monitored for the following issues for this low-risk pregnancy and has Supervision of other normal pregnancy, antepartum; Late prenatal care; History of cesarean delivery; Obesity, morbid (HCC); and Depression on their problem list.  Patient reports no complaints.  Contractions: Not present. Vag. Bleeding: None.  Movement: Present. Denies leaking of fluid.   The following portions of the patient's history were reviewed and updated as appropriate: allergies, current medications, past family history, past medical history, past social history, past surgical history and problem list.   Objective:   Vitals:   04/11/21 1000  BP: 106/62  Pulse: 91  Weight: 280 lb (127 kg)    Fetal Status:     Movement: Present     General:  Alert, oriented and cooperative. Patient is in no acute distress.  Skin: Skin is warm and dry. No rash noted.   Cardiovascular: Normal heart rate noted  Respiratory: Normal respiratory effort, no problems with respiration noted  Abdomen: Soft, gravid, appropriate for gestational age.  Pain/Pressure: Present     Pelvic: Cervical exam performed in the presence of a chaperone        Extremities: Normal range of motion.  Edema: None  Mental Status: Normal mood and affect. Normal behavior. Normal judgment and thought content.   Assessment and Plan:  Pregnancy: G2P1001 at [redacted]w[redacted]d 1. Supervision of other normal pregnancy, antepartum - GBS/GC/CT done today - [place] on 1/18 was normal, growth 65%ile. Has serial growth and BPPs due to dating by third trimester [place] and possible history of FGR  2. History of cesarean delivery Scheduled for 2/16  3. Late prenatal care Presented at 28w  4. Obesity, morbid (HCC)   5. Depression, unspecified depression type - She was taking Sertraline as prescribed (50 mg) but stopped when she  found out she was pregnant. Mood is poor and she would like to restart sertraline.  - Offered IBH referral and she accepts  Preterm labor symptoms and general obstetric precautions including but not limited to vaginal bleeding, contractions, leaking of fluid and fetal movement were reviewed in detail with the patient. Please refer to After Visit Summary for other counseling recommendations.   Return in about 1 week (around 04/18/2021) for OB VISIT, MD or APP.  Future Appointments  Date Time Provider Department Center  04/14/2021  1:45 PM [facility] NURSE [facility]  04/14/2021  2:00 PM [facility] US1 [facility]  04/18/2021 11:15 AM Milas Hock, MD [facility] None  04/21/2021  1:30 PM [facility] NURSE [facility]  04/21/2021  1:45 PM [facility] US6 [facility]  04/25/2021 11:15 AM Anyanwu, Jethro Bastos, MD [facility] None  04/28/2021  1:45 PM [facility] NURSE [facility]  04/28/2021  2:00 PM [facility] US1 [facility]  05/02/2021 10:35 AM Anyanwu, Jethro Bastos, MD [facility] None  05/03/2021  1:30 PM [facility] NURSE [facility]  05/03/2021  1:45 PM [facility] US6 [facility]    Milas Hock, MD

## 2021-04-12 LAB — GC/CHLAMYDIA PROBE AMP (~~LOC~~) NOT AT ARMC
Chlamydia: NEGATIVE
Comment: NEGATIVE
Comment: NORMAL
Neisseria Gonorrhea: NEGATIVE

## 2021-04-14 ENCOUNTER — Ambulatory Visit (INDEPENDENT_AMBULATORY_CARE_PROVIDER_SITE_OTHER): Payer: Medicare (Managed Care) | Admitting: Licensed Clinical Social Worker

## 2021-04-14 ENCOUNTER — Ambulatory Visit: Payer: Medicare (Managed Care) | Attending: Maternal & Fetal Medicine

## 2021-04-14 ENCOUNTER — Other Ambulatory Visit: Payer: Self-pay | Admitting: Maternal & Fetal Medicine

## 2021-04-14 ENCOUNTER — Other Ambulatory Visit: Payer: Self-pay

## 2021-04-14 ENCOUNTER — Ambulatory Visit: Payer: Medicare (Managed Care) | Admitting: *Deleted

## 2021-04-14 VITALS — BP 107/66 | HR 93

## 2021-04-14 DIAGNOSIS — O99213 Obesity complicating pregnancy, third trimester: Secondary | ICD-10-CM | POA: Diagnosis not present

## 2021-04-14 DIAGNOSIS — Z3A36 36 weeks gestation of pregnancy: Secondary | ICD-10-CM | POA: Diagnosis not present

## 2021-04-14 DIAGNOSIS — F32A Depression, unspecified: Secondary | ICD-10-CM

## 2021-04-14 DIAGNOSIS — O09893 Supervision of other high risk pregnancies, third trimester: Secondary | ICD-10-CM | POA: Diagnosis not present

## 2021-04-14 DIAGNOSIS — O0933 Supervision of pregnancy with insufficient antenatal care, third trimester: Secondary | ICD-10-CM

## 2021-04-14 DIAGNOSIS — Z98891 History of uterine scar from previous surgery: Secondary | ICD-10-CM

## 2021-04-14 DIAGNOSIS — O283 Abnormal ultrasonic finding on antenatal screening of mother: Secondary | ICD-10-CM

## 2021-04-14 DIAGNOSIS — O34219 Maternal care for unspecified type scar from previous cesarean delivery: Secondary | ICD-10-CM | POA: Diagnosis not present

## 2021-04-14 DIAGNOSIS — O0930 Supervision of pregnancy with insufficient antenatal care, unspecified trimester: Secondary | ICD-10-CM

## 2021-04-14 DIAGNOSIS — Z3480 Encounter for supervision of other normal pregnancy, unspecified trimester: Secondary | ICD-10-CM

## 2021-04-14 DIAGNOSIS — Z363 Encounter for antenatal screening for malformations: Secondary | ICD-10-CM | POA: Diagnosis not present

## 2021-04-14 LAB — CULTURE, BETA STREP (GROUP B ONLY): Strep Gp B Culture: POSITIVE — AB

## 2021-04-14 IMAGING — US US MFM FETAL BPP W/ NON-STRESS
1 series · 15 of 28 positions shown · non-contrast
Comparison: none

[Series 1: us mfm fetal bpp w/ non-stress · 34 acquisitions, 15 frames shown]
[im 1/34]
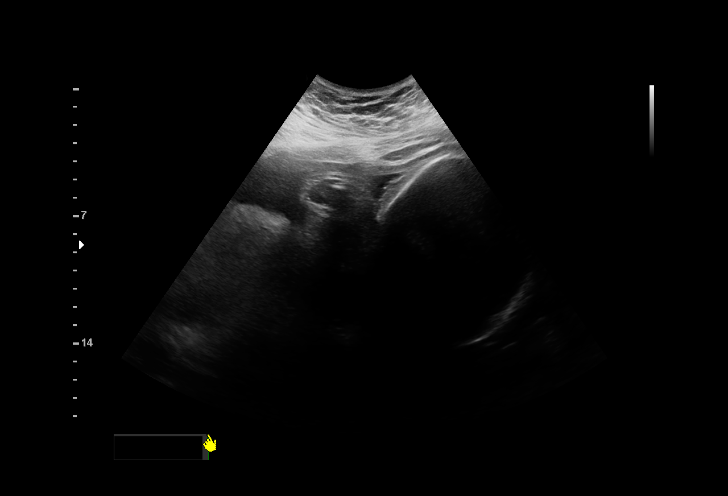
[im 3/34]
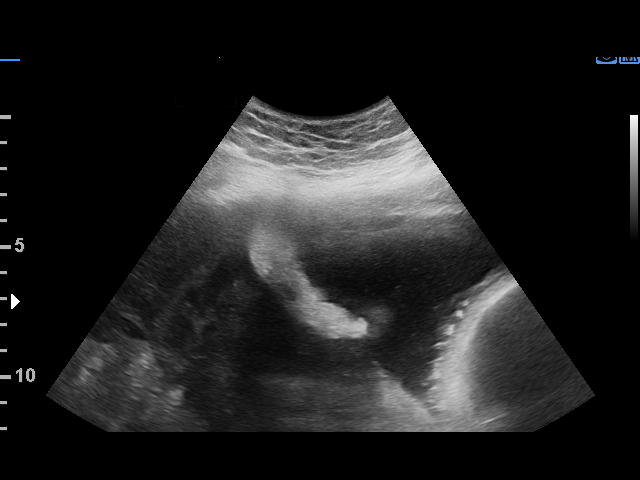
[im 5/34]
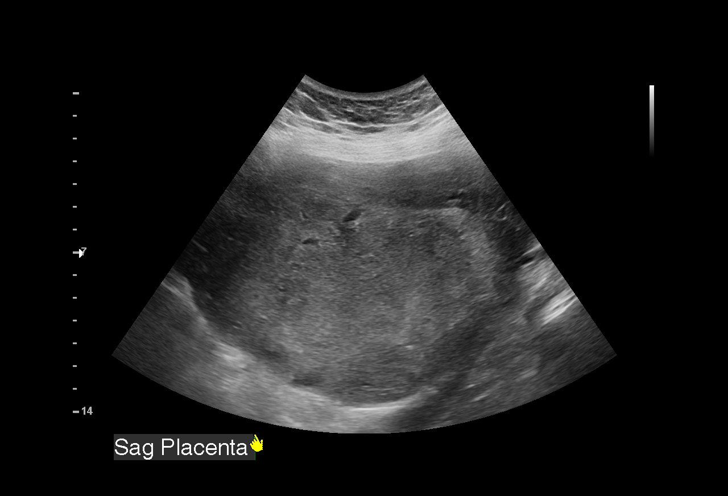
[im 8/34]
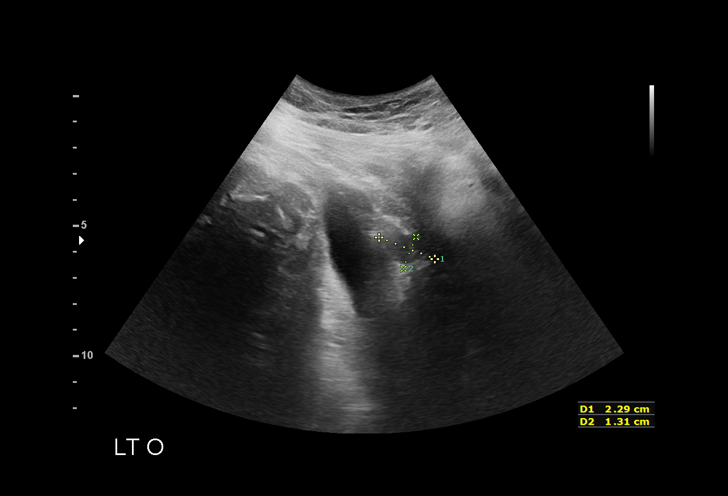
[im 10/34]
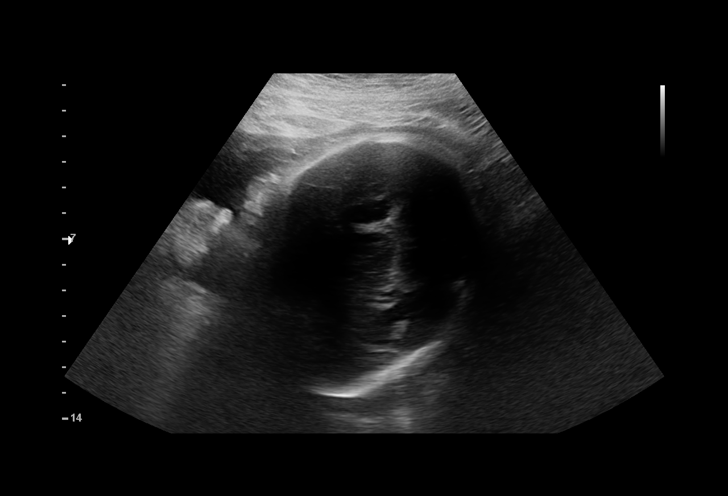
[im 13/34]
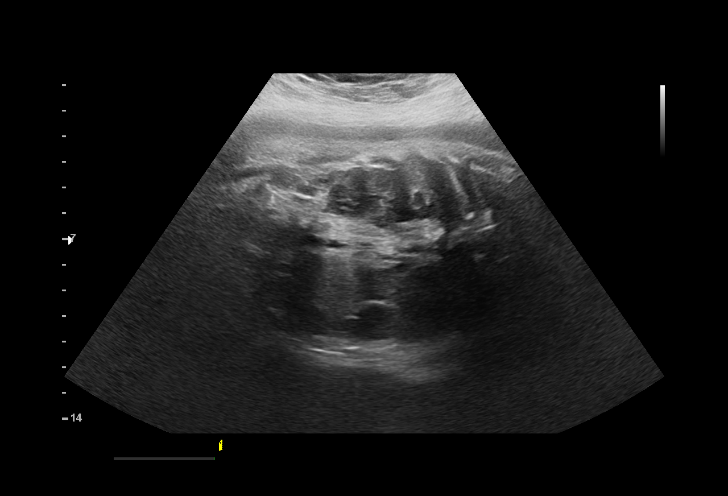
[im 15/34]
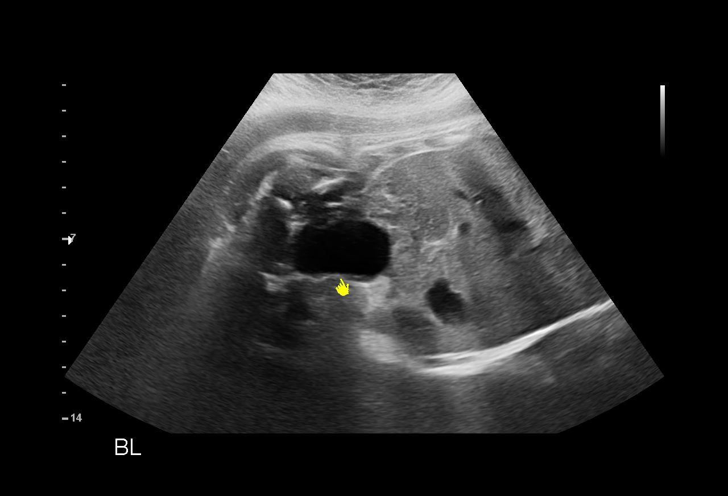
[im 18/34]
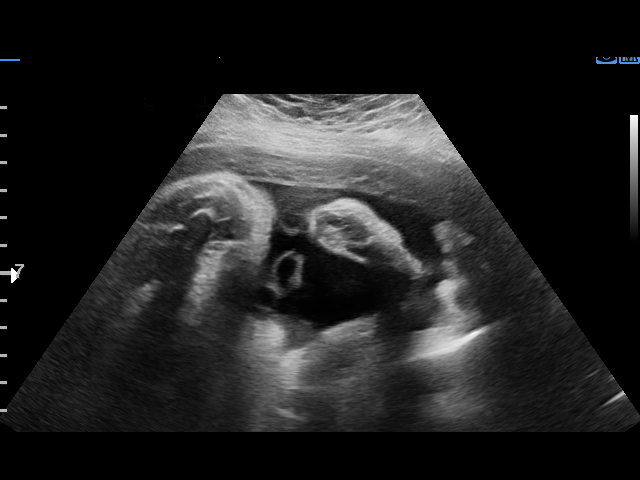
[im 19/34]
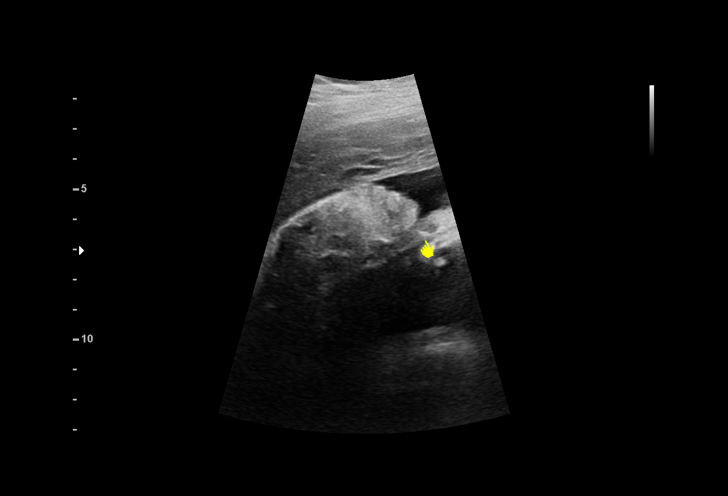
[im 21/34]
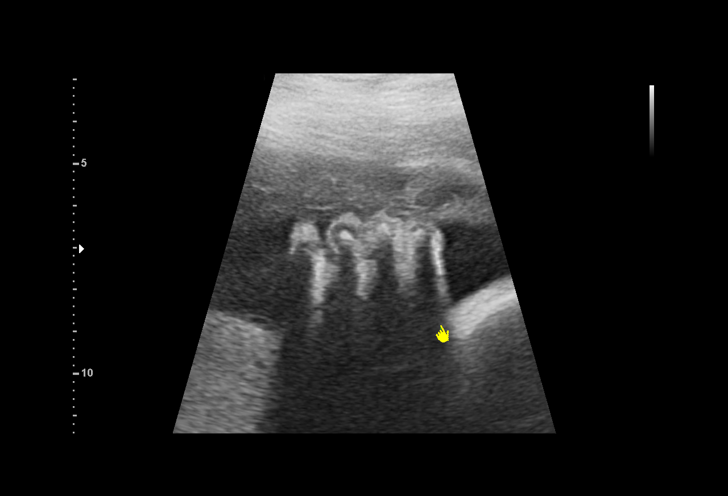
[im 24/34]
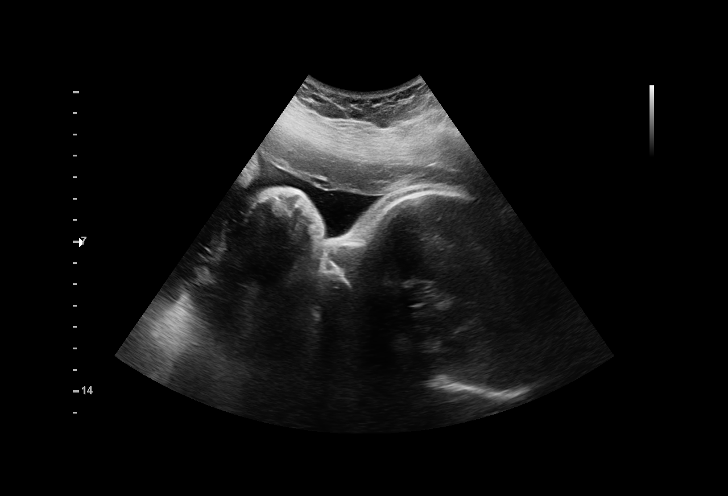
[im 26/34]
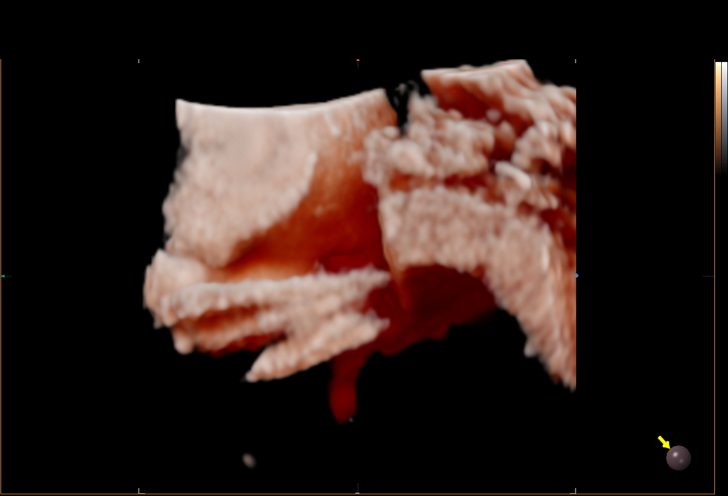
[im 29/34]
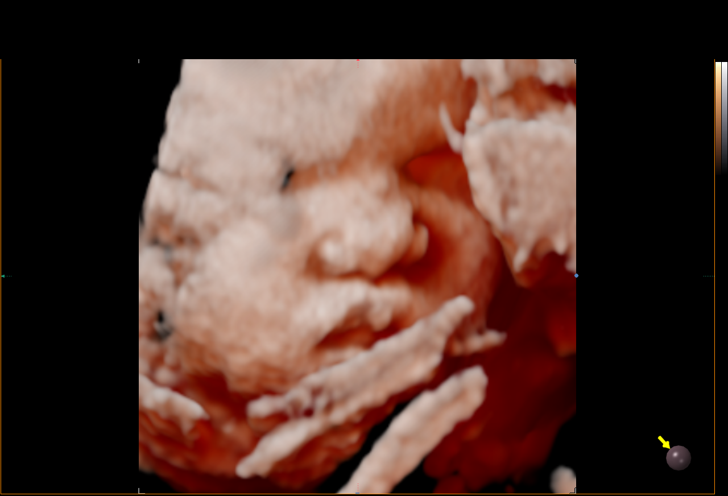
[im 31/34]
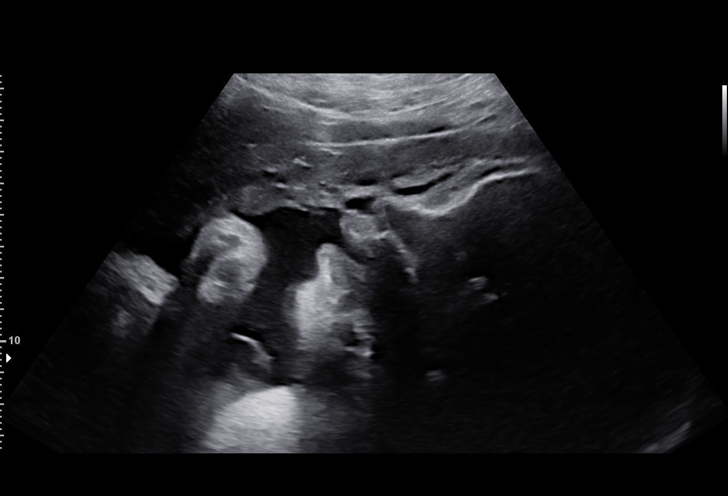
[im 34/34]
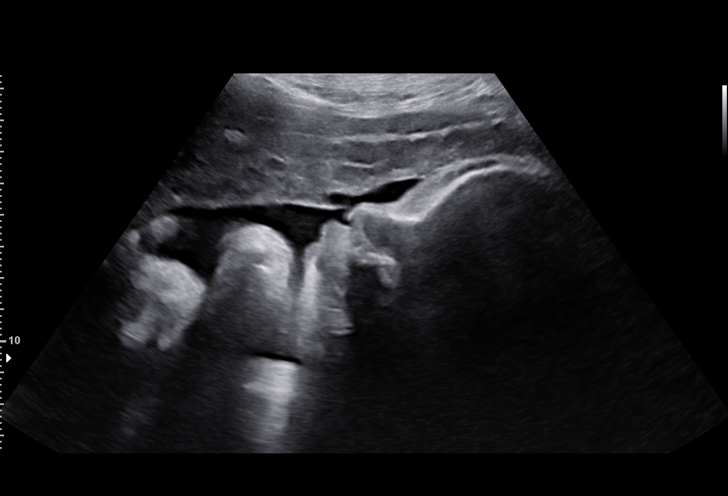

[15 of 28 positions shown; findings below may reference images not displayed]

[id number]

    W/NONSTRESS                                       ASIT

Indications

 Obesity complicating pregnancy, third
 trimester (BMI 41)
 Encounter for antenatal screening for
 malformations
 Late to prenatal care, third trimester
 History of cesarean delivery, currently
 pregnant
 Short interval between pregancies, 3rd
 trimester
 36 weeks gestation of pregnancy
Fetal Evaluation

 Num Of Fetuses:         1
 Fetal Heart Rate(bpm):  143
 Cardiac Activity:       Observed
 Presentation:           Cephalic
 Placenta:               Posterior
 P. Cord Insertion:      Previously Visualized

 Amniotic Fluid
 AFI FV:      Within normal limits

 AFI Sum(cm)     %Tile       Largest Pocket(cm)
 13.69           50
 RUQ(cm)       RLQ(cm)       LUQ(cm)        LLQ(cm)

Biophysical Evaluation

 Amniotic F.V:   Within normal limits       F. Tone:        Observed
 F. Movement:    Observed                   N.S.T:          Reactive
 F. Breathing:   Not Observed               Score:          [DATE]
OB History

 Gravidity:    2         Term:   1        Prem:   0        SAB:   0
 TOP:          0       Ectopic:  0        Living: 1
Gestational Age

 Best:          36w 3d     Det. By:  U/S  (02/18/21)          EDD:   05/09/21
Anatomy

 Ventricles:            Appears normal         Kidneys:                Appear normal
 Heart:                 Appears normal         Bladder:                Appears normal
                        (4CH, axis, and
                        situs)
 Stomach:               Appears normal, left
                        sided
Cervix Uterus Adnexa

 Cervix
 Not visualized (advanced GA >65wks)

 Right Ovary
 Visualized.

 Left Ovary
 Visualized.
Comments

 This patient was seen for a BPP due to maternal obesity with
 a BMI over 40.  She denies any problems since her last exam
 and reports feeling fetal movements throughout the day.
 A biophysical profile performed today was [DATE].  She
 received a -2 for fetal breathing movements that did not meet
 criteria.  She subsequently had a reactive NST making her
 total biophysical profile score [DATE].
 There was normal amniotic fluid noted on today's ultrasound
 exam.
 Due to maternal obesity, we will continue weekly fetal testing
 until delivery.
 Another BPP was scheduled in 1 week.

## 2021-04-14 NOTE — Procedures (Signed)
Carly Brooks [birth year] [redacted]w[redacted]d  Fetus A Non-Stress Test Interpretation for 04/14/21  Indication: Unsatisfactory BPP  Fetal Heart Rate A Mode: External Baseline Rate (A): 140 bpm Variability: Minimal, Moderate Accelerations: 15 x 15 Decelerations: None Multiple birth?: No  Uterine Activity Mode: Palpation, Toco Contraction Frequency (min): 1 uc Contraction Duration (sec): 60 Contraction Quality: Mild Resting Tone Palpated: Relaxed Resting Time: Adequate  Interpretation (Fetal Testing) Nonstress Test Interpretation: Reactive Overall Impression: Reassuring for gestational age Comments: Dr. Parke Poisson reviewed tracing

## 2021-04-15 NOTE — BH Specialist Note (Signed)
Integrated Behavioral Health via Telemedicine Visit  04/15/2021 Berkley Wrightsman [id number]  Number of Integrated Behavioral Health visits: 1 Session Start time: 930am  Session End time: 955am Total time: 25 mins via mychart video   Referring Provider: Dr Para March  Patient/Family location: Home  [facility] Provider location: [facility]  All persons participating in visit: Pt  Types of Service: Individual psychotherapy and Video visit  I connected with Cranford Mon and/or Kelli Hope Fairburn's n/a via  Telephone or Video Enabled Telemedicine Application  (Video is Caregility application) and verified that I am speaking with the correct person using two identifiers. Discussed confidentiality: Yes   I discussed the limitations of telemedicine and the availability of in person appointments.  Discussed there is a possibility of technology failure and discussed alternative modes of communication if that failure occurs.  I discussed that engaging in this telemedicine visit, they consent to the provision of behavioral healthcare and the services will be billed under their insurance.  Patient and/or legal guardian expressed understanding and consented to Telemedicine visit: Yes   Presenting Concerns: Patient and/or family reports the following symptoms/concerns: depressed mood, feeling overwhelmed and social isolation  Duration of problem: approx 6 months ; Severity of problem: mild  Patient and/or Family's Strengths/Protective Factors: Concrete supports in place (healthy food, safe environments, etc.)  Goals Addressed: Patient will:  Reduce symptoms of: depression   Increase knowledge and/or ability of: coping skills   Demonstrate ability to: Increase healthy adjustment to current life circumstances and Increase adequate support systems for patient/family  Progress towards Goals: Ongoing  Interventions: Interventions utilized:  Supportive Counseling Standardized  Assessments completed: PHQ 9  Patient and/or Family Response: Ms. Hirschfeld responded well to mychart visit  Assessment: Patient currently experiencing depression affecting pregnancy.   Patient may benefit from integrated behavioral health.  Plan: Follow up with behavioral health clinician on : 2 week mood check after delivery  Behavioral recommendations: Join a support group or take a online/in person class when available to decrease social isolation, prioritize rest, communicate needs and delegate task to prevent burnout, and take prescribed medication as directed  Referral(s): Integrated [employer] (In Clinic)  I discussed the assessment and treatment plan with the patient and/or parent/guardian. They were provided an opportunity to ask questions and all were answered. They agreed with the plan and demonstrated an understanding of the instructions.   They were advised to call back or seek an in-person evaluation if the symptoms worsen or if the condition fails to improve as anticipated.  Gwyndolyn Saxon, LCSW

## 2021-04-18 ENCOUNTER — Inpatient Hospital Stay (HOSPITAL_COMMUNITY): Payer: Medicare (Managed Care) | Admitting: Anesthesiology

## 2021-04-18 ENCOUNTER — Inpatient Hospital Stay (HOSPITAL_COMMUNITY)
Admission: AD | Admit: 2021-04-18 | Discharge: 2021-04-20 | DRG: 787 | Disposition: A | Discharge status: Home / Self Care | Payer: Medicare (Managed Care) | Attending: Family Medicine | Admitting: Family Medicine

## 2021-04-18 ENCOUNTER — Encounter (HOSPITAL_COMMUNITY): Payer: Self-pay | Admitting: Family Medicine

## 2021-04-18 ENCOUNTER — Inpatient Hospital Stay (HOSPITAL_COMMUNITY): Admit: 2021-04-18 | Payer: Medicare (Managed Care) | Admitting: Family Medicine

## 2021-04-18 ENCOUNTER — Ambulatory Visit (INDEPENDENT_AMBULATORY_CARE_PROVIDER_SITE_OTHER): Payer: Medicare (Managed Care) | Admitting: Obstetrics and Gynecology

## 2021-04-18 ENCOUNTER — Other Ambulatory Visit: Payer: Self-pay

## 2021-04-18 ENCOUNTER — Encounter (HOSPITAL_COMMUNITY)
Admission: AD | Disposition: A | Discharge status: Home / Self Care | Payer: Self-pay | Source: Home / Self Care | Attending: Family Medicine

## 2021-04-18 ENCOUNTER — Inpatient Hospital Stay (HOSPITAL_BASED_OUTPATIENT_CLINIC_OR_DEPARTMENT_OTHER): Payer: Medicare (Managed Care)

## 2021-04-18 VITALS — BP 106/63 | HR 91 | Wt 282.0 lb

## 2021-04-18 DIAGNOSIS — Z3A37 37 weeks gestation of pregnancy: Secondary | ICD-10-CM

## 2021-04-18 DIAGNOSIS — O99824 Streptococcus B carrier state complicating childbirth: Secondary | ICD-10-CM | POA: Diagnosis present

## 2021-04-18 DIAGNOSIS — E669 Obesity, unspecified: Secondary | ICD-10-CM

## 2021-04-18 DIAGNOSIS — O0930 Supervision of pregnancy with insufficient antenatal care, unspecified trimester: Secondary | ICD-10-CM

## 2021-04-18 DIAGNOSIS — O99213 Obesity complicating pregnancy, third trimester: Secondary | ICD-10-CM

## 2021-04-18 DIAGNOSIS — O368130 Decreased fetal movements, third trimester, not applicable or unspecified: Secondary | ICD-10-CM

## 2021-04-18 DIAGNOSIS — O99820 Streptococcus B carrier state complicating pregnancy: Secondary | ICD-10-CM | POA: Diagnosis not present

## 2021-04-18 DIAGNOSIS — Z3480 Encounter for supervision of other normal pregnancy, unspecified trimester: Secondary | ICD-10-CM

## 2021-04-18 DIAGNOSIS — Z98891 History of uterine scar from previous surgery: Secondary | ICD-10-CM

## 2021-04-18 DIAGNOSIS — O9081 Anemia of the puerperium: Secondary | ICD-10-CM | POA: Diagnosis not present

## 2021-04-18 DIAGNOSIS — Z30014 Encounter for initial prescription of intrauterine contraceptive device: Secondary | ICD-10-CM

## 2021-04-18 DIAGNOSIS — Z30430 Encounter for insertion of intrauterine contraceptive device: Secondary | ICD-10-CM | POA: Diagnosis not present

## 2021-04-18 DIAGNOSIS — D62 Acute posthemorrhagic anemia: Secondary | ICD-10-CM | POA: Diagnosis not present

## 2021-04-18 DIAGNOSIS — O368330 Maternal care for abnormalities of the fetal heart rate or rhythm, third trimester, not applicable or unspecified: Secondary | ICD-10-CM | POA: Diagnosis not present

## 2021-04-18 DIAGNOSIS — O99214 Obesity complicating childbirth: Secondary | ICD-10-CM | POA: Diagnosis present

## 2021-04-18 DIAGNOSIS — O34211 Maternal care for low transverse scar from previous cesarean delivery: Secondary | ICD-10-CM | POA: Diagnosis present

## 2021-04-18 DIAGNOSIS — O368190 Decreased fetal movements, unspecified trimester, not applicable or unspecified: Secondary | ICD-10-CM

## 2021-04-18 DIAGNOSIS — Z20822 Contact with and (suspected) exposure to covid-19: Secondary | ICD-10-CM | POA: Diagnosis present

## 2021-04-18 HISTORY — DX: Anxiety disorder, unspecified: F41.9

## 2021-04-18 HISTORY — DX: Depression, unspecified: F32.A

## 2021-04-18 LAB — RESP PANEL BY RT-PCR (FLU A&B, COVID) ARPGX2
Influenza A by PCR: NEGATIVE
Influenza B by PCR: NEGATIVE
SARS Coronavirus 2 by RT PCR: NEGATIVE

## 2021-04-18 LAB — CBC
HCT: 31.4 % — ABNORMAL LOW (ref 36.0–46.0)
Hemoglobin: 9.5 g/dL — ABNORMAL LOW (ref 12.0–15.0)
MCH: 22.9 pg — ABNORMAL LOW (ref 26.0–34.0)
MCHC: 30.3 g/dL (ref 30.0–36.0)
MCV: 75.8 fL — ABNORMAL LOW (ref 80.0–100.0)
Platelets: 285 10*3/uL (ref 150–400)
RBC: 4.14 MIL/uL (ref 3.87–5.11)
RDW: 17.1 % — ABNORMAL HIGH (ref 11.5–15.5)
WBC: 8.9 10*3/uL (ref 4.0–10.5)
nRBC: 0.2 % (ref 0.0–0.2)

## 2021-04-18 LAB — TYPE AND SCREEN
ABO/RH(D): O POS
Antibody Screen: NEGATIVE

## 2021-04-18 IMAGING — US US MFM FETAL BPP W/O NON-STRESS
1 series · 13 of 20 positions shown · non-contrast
Comparison: none

[Series 1: us mfm fetal bpp w/o non-stress · 20 acquisitions, 13 frames shown]
[im 1/20]
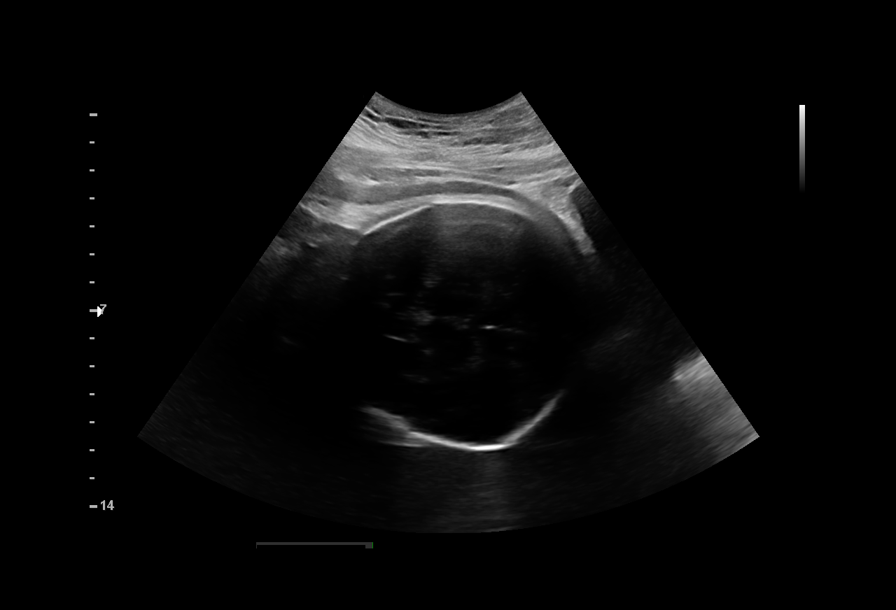
[im 3/20]
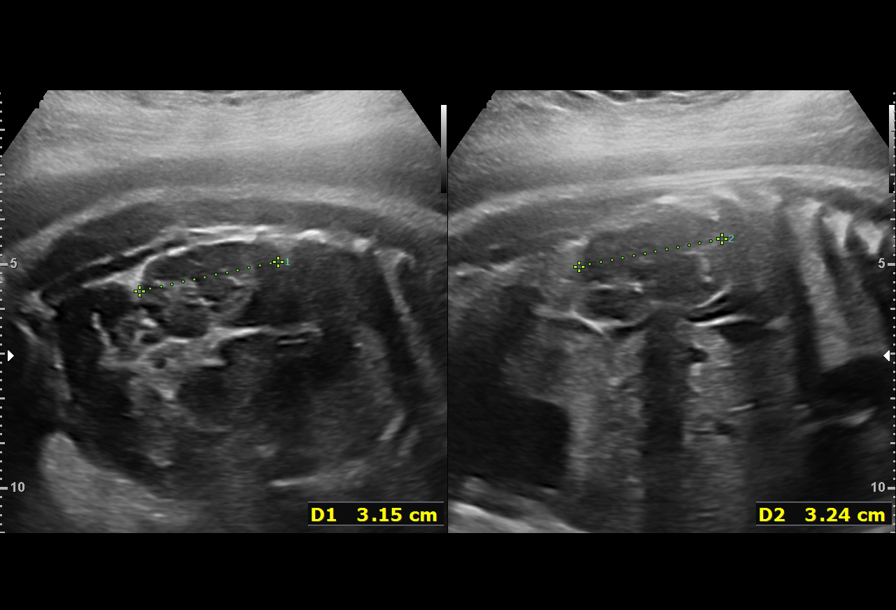
[im 4/20]
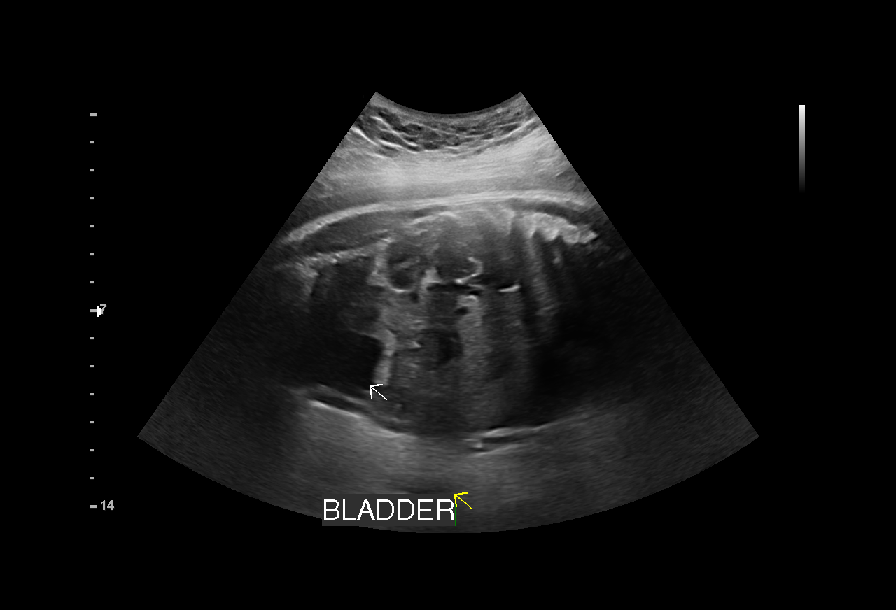
[im 6/20]
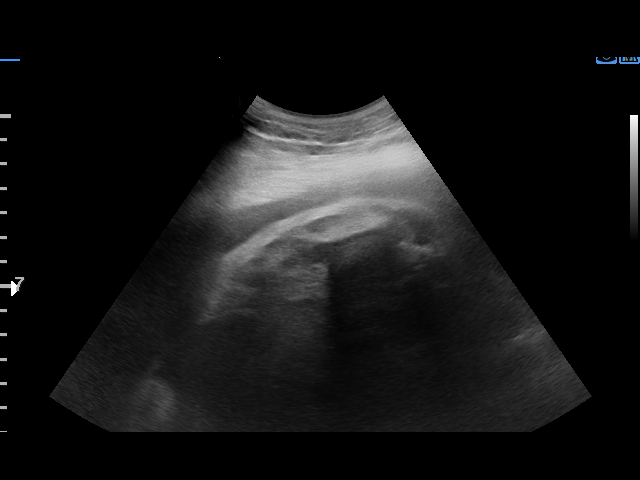
[im 7/20]
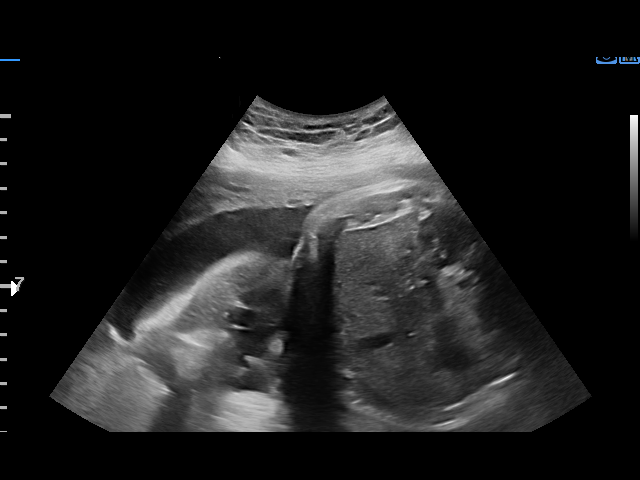
[im 9/20]
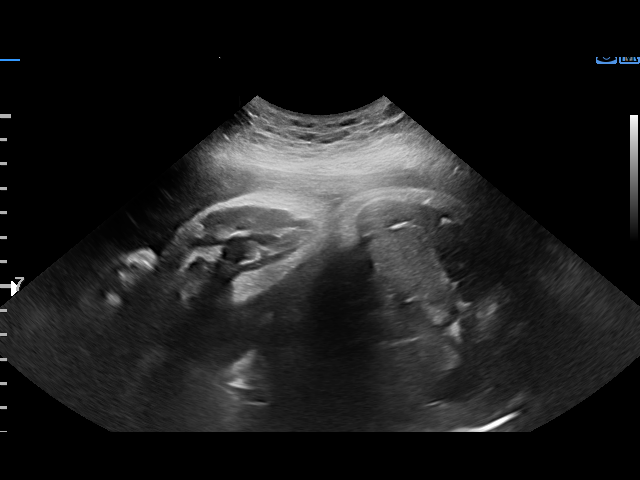
[im 11/20]
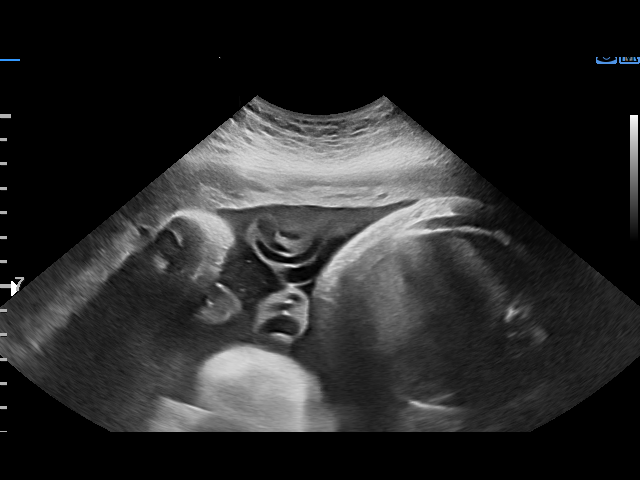
[im 12/20]
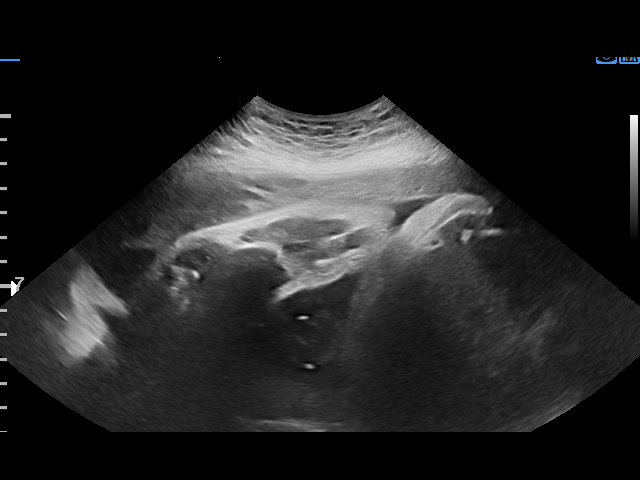
[im 14/20]
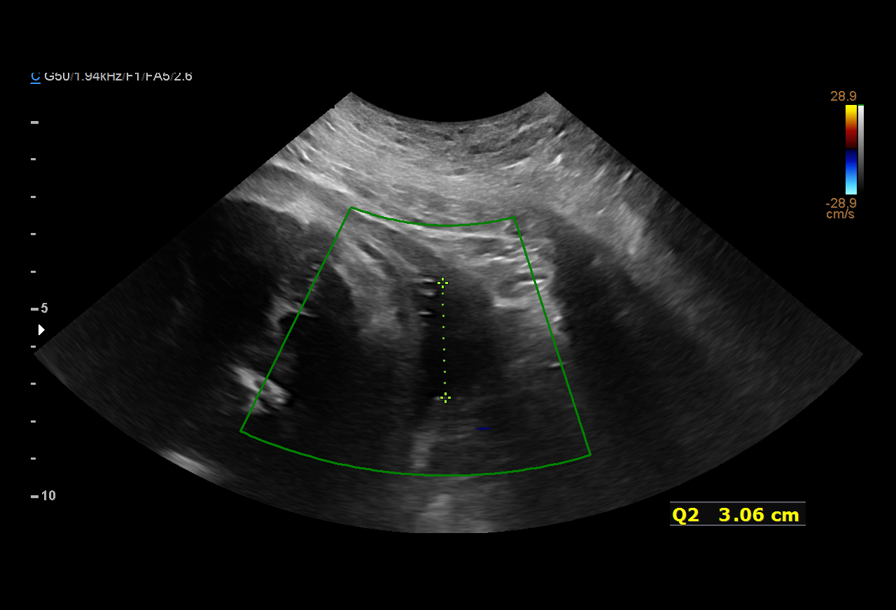
[im 15/20]
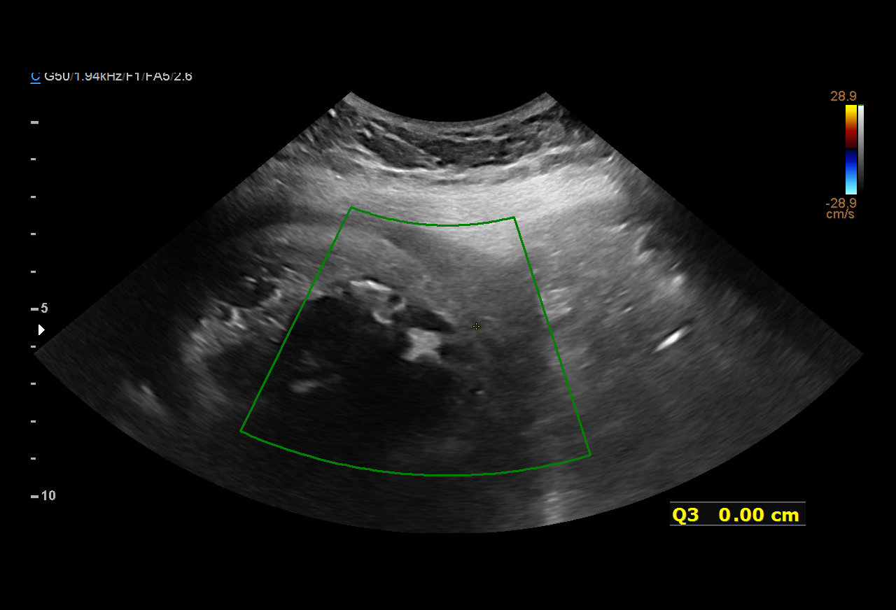
[im 17/20]
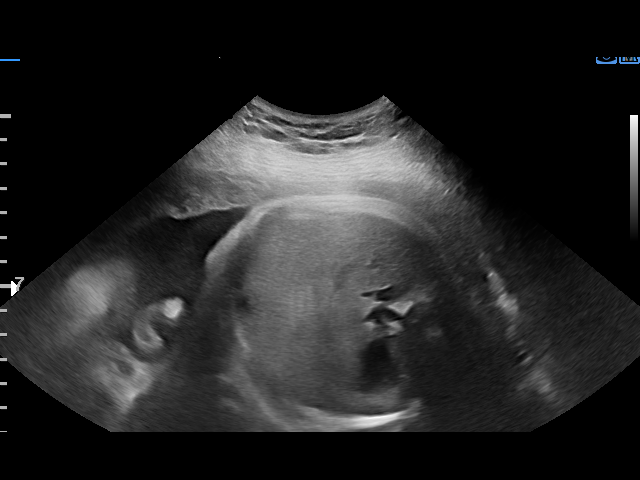
[im 18/20]
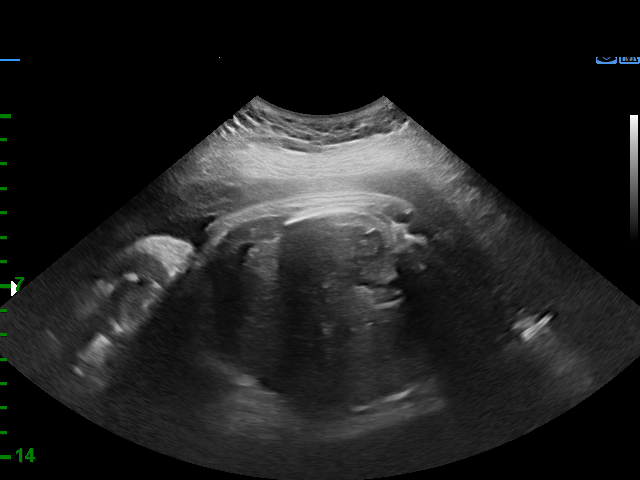
[im 20/20]
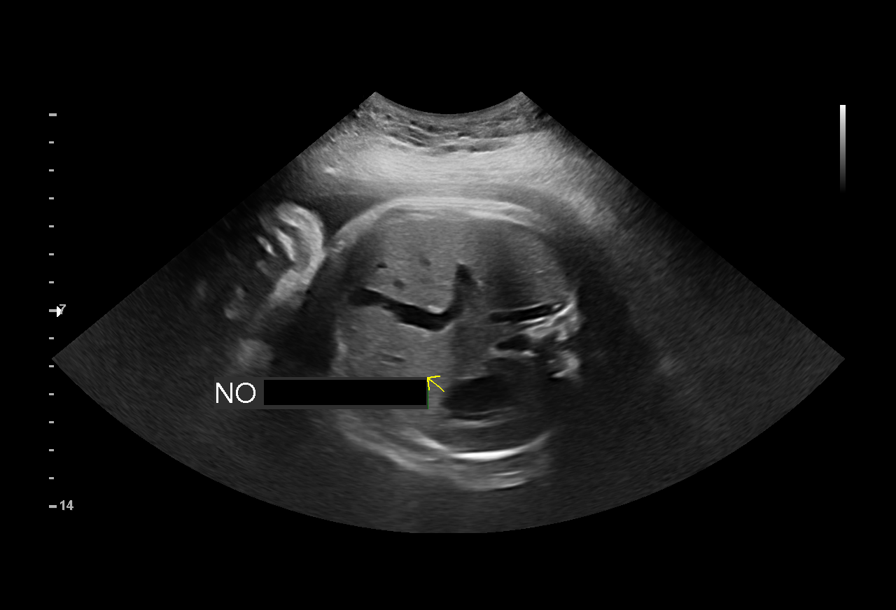

[13 of 20 positions shown; findings below may reference images not displayed]

[id number]

Indications

 Decreased fetal movements, third trimester,
 unspecified
 37 weeks gestation of pregnancy
 Encounter for antenatal screening,
 unspecified
 Obesity complicating pregnancy, third
 trimester (BMI 41)
 Late to prenatal care, third trimester
 History of cesarean delivery, currently
 pregnant
 Short interval between pregancies, 3rd
 trimester
Fetal Evaluation

 Num Of Fetuses:         1
 Fetal Heart Rate(bpm):  135
 Cardiac Activity:       Observed
 Presentation:           Cephalic

 Amniotic Fluid
 AFI FV:      Within normal limits

 AFI Sum(cm)     %Tile       Largest Pocket(cm)
 12.9            46

 RUQ(cm)       RLQ(cm)       LUQ(cm)        LLQ(cm)
 3.4           6.4           3.1            0
Biophysical Evaluation

 Amniotic F.V:   Within normal limits       F. Tone:        Observed
 F. Movement:    Observed                   Score:          [DATE]
 F. Breathing:   Not Observed
OB History

 Gravidity:    2         Term:   1        Prem:   0        SAB:   0
 TOP:          0       Ectopic:  0        Living: 1
Gestational Age

 Best:          37w 0d     Det. By:  U/S  (02/18/21)          EDD:   05/09/21
Anatomy

 Thoracic:              Appears normal         Bladder:                Appears normal
 Stomach:               Appears normal, left
                        sided
Comments

 This patient presented to the [facility] due to decreased fetal
 movements.
 A biophysical profile performed today was [DATE].  She
 received a -2 for fetal breathing movements that did not meet
 criteria.
 There was normal amniotic fluid noted on today's ultrasound
 exam.
 Due to nonreassuring fetal status, the patient will be delivered
 today.

## 2021-04-18 SURGERY — Surgical Case
Anesthesia: Spinal

## 2021-04-18 MED ORDER — LEVONORGESTREL 20.1 MCG/DAY IU IUD
INTRAUTERINE_SYSTEM | INTRAUTERINE | Status: AC
  Filled 2021-04-18: qty 1

## 2021-04-18 MED ORDER — CEFAZOLIN IN SODIUM CHLORIDE 3-0.9 GM/100ML-% IV SOLN
3.0000 g | INTRAVENOUS | Status: AC
  Administered 2021-04-18: 3 g via INTRAVENOUS

## 2021-04-18 MED ORDER — ENOXAPARIN SODIUM 40 MG/0.4ML IJ SOSY
40.0000 mg | PREFILLED_SYRINGE | INTRAMUSCULAR | Status: DC
  Administered 2021-04-19: 40 mg via SUBCUTANEOUS
  Filled 2021-04-18: qty 0.4

## 2021-04-18 MED ORDER — PHENYLEPHRINE HCL-NACL 20-0.9 MG/250ML-% IV SOLN
INTRAVENOUS | Status: AC
  Filled 2021-04-18: qty 250

## 2021-04-18 MED ORDER — KETOROLAC TROMETHAMINE 30 MG/ML IJ SOLN: 30.0000 mg | Freq: Four times a day (QID) | INTRAMUSCULAR | Status: AC | PRN

## 2021-04-18 MED ORDER — SODIUM CHLORIDE 0.9% FLUSH: 3.0000 mL | INTRAVENOUS | Status: DC | PRN

## 2021-04-18 MED ORDER — PHENYLEPHRINE HCL (PRESSORS) 10 MG/ML IV SOLN
INTRAVENOUS | Status: DC | PRN
  Administered 2021-04-18 (×2): 80 ug via INTRAVENOUS
  Administered 2021-04-18: 160 ug via INTRAVENOUS
  Administered 2021-04-18 (×4): 40 ug via INTRAVENOUS
  Administered 2021-04-18: 80 ug via INTRAVENOUS
  Administered 2021-04-18: 120 ug via INTRAVENOUS
  Administered 2021-04-18 (×2): 80 ug via INTRAVENOUS
  Administered 2021-04-18: 40 ug via INTRAVENOUS

## 2021-04-18 MED ORDER — DIBUCAINE (PERIANAL) 1 % EX OINT: 1.0000 "application " | TOPICAL_OINTMENT | CUTANEOUS | Status: DC | PRN

## 2021-04-18 MED ORDER — SERTRALINE HCL 50 MG PO TABS
50.0000 mg | ORAL_TABLET | Freq: Every day | ORAL | Status: DC
  Administered 2021-04-18 – 2021-04-20 (×3): 50 mg via ORAL
  Filled 2021-04-18 (×3): qty 1

## 2021-04-18 MED ORDER — FENTANYL CITRATE (PF) 100 MCG/2ML IJ SOLN
INTRAMUSCULAR | Status: DC | PRN
  Administered 2021-04-18: 15 ug via INTRATHECAL

## 2021-04-18 MED ORDER — DIPHENHYDRAMINE HCL 25 MG PO CAPS
25.0000 mg | ORAL_CAPSULE | Freq: Four times a day (QID) | ORAL | Status: DC | PRN
  Administered 2021-04-19 (×2): 25 mg via ORAL
  Filled 2021-04-18: qty 1

## 2021-04-18 MED ORDER — NALBUPHINE HCL 10 MG/ML IJ SOLN
INTRAMUSCULAR | Status: AC
  Filled 2021-04-18: qty 1

## 2021-04-18 MED ORDER — OXYTOCIN-SODIUM CHLORIDE 30-0.9 UT/500ML-% IV SOLN: INTRAVENOUS | Status: DC | PRN

## 2021-04-18 MED ORDER — ACETAMINOPHEN 10 MG/ML IV SOLN
INTRAVENOUS | Status: AC
  Filled 2021-04-18: qty 100

## 2021-04-18 MED ORDER — NALOXONE HCL 0.4 MG/ML IJ SOLN: 0.4000 mg | INTRAMUSCULAR | Status: DC | PRN

## 2021-04-18 MED ORDER — KETOROLAC TROMETHAMINE 30 MG/ML IJ SOLN
30.0000 mg | Freq: Once | INTRAMUSCULAR | Status: AC
  Administered 2021-04-18: 30 mg via INTRAVENOUS

## 2021-04-18 MED ORDER — ONDANSETRON HCL 4 MG/2ML IJ SOLN
INTRAMUSCULAR | Status: AC
  Filled 2021-04-18: qty 2

## 2021-04-18 MED ORDER — MORPHINE SULFATE (PF) 0.5 MG/ML IJ SOLN
INTRAMUSCULAR | Status: AC
  Filled 2021-04-18: qty 10

## 2021-04-18 MED ORDER — ONDANSETRON HCL 4 MG/2ML IJ SOLN
INTRAMUSCULAR | Status: DC | PRN
Start: 2021-04-18 — End: 2021-04-18
  Administered 2021-04-18: 4 mg via INTRAVENOUS

## 2021-04-18 MED ORDER — SCOPOLAMINE 1 MG/3DAYS TD PT72
MEDICATED_PATCH | TRANSDERMAL | Status: AC
  Filled 2021-04-18: qty 1

## 2021-04-18 MED ORDER — IBUPROFEN 600 MG PO TABS
600.0000 mg | ORAL_TABLET | Freq: Four times a day (QID) | ORAL | Status: DC
  Administered 2021-04-18 – 2021-04-20 (×7): 600 mg via ORAL
  Filled 2021-04-18 (×7): qty 1

## 2021-04-18 MED ORDER — POVIDONE-IODINE 10 % EX SWAB
2.0000 "application " | Freq: Once | CUTANEOUS | Status: AC
  Administered 2021-04-18: 2 via TOPICAL

## 2021-04-18 MED ORDER — LACTATED RINGERS IV SOLN: INTRAVENOUS | Status: DC

## 2021-04-18 MED ORDER — MORPHINE SULFATE (PF) 0.5 MG/ML IJ SOLN
INTRAMUSCULAR | Status: DC | PRN
  Administered 2021-04-18: 150 ug via INTRATHECAL

## 2021-04-18 MED ORDER — CEFAZOLIN IN SODIUM CHLORIDE 3-0.9 GM/100ML-% IV SOLN
INTRAVENOUS | Status: AC
  Filled 2021-04-18: qty 100

## 2021-04-18 MED ORDER — LACTATED RINGERS IV BOLUS
1000.0000 mL | Freq: Once | INTRAVENOUS | Status: AC
  Administered 2021-04-18: 1000 mL via INTRAVENOUS

## 2021-04-18 MED ORDER — WITCH HAZEL-GLYCERIN EX PADS: 1.0000 "application " | MEDICATED_PAD | CUTANEOUS | Status: DC | PRN

## 2021-04-18 MED ORDER — DIPHENHYDRAMINE HCL 50 MG/ML IJ SOLN
INTRAMUSCULAR | Status: DC | PRN
  Administered 2021-04-18: 12.5 mg via INTRAVENOUS

## 2021-04-18 MED ORDER — NALBUPHINE HCL 10 MG/ML IJ SOLN
5.0000 mg | INTRAMUSCULAR | Status: DC | PRN
  Administered 2021-04-18: 5 mg via INTRAVENOUS

## 2021-04-18 MED ORDER — MENTHOL 3 MG MT LOZG
1.0000 | LOZENGE | OROMUCOSAL | Status: DC | PRN
Start: 2021-04-18 — End: 2021-04-20

## 2021-04-18 MED ORDER — OXYTOCIN-SODIUM CHLORIDE 30-0.9 UT/500ML-% IV SOLN: 2.5000 [IU]/h | INTRAVENOUS | Status: AC

## 2021-04-18 MED ORDER — DEXAMETHASONE SODIUM PHOSPHATE 4 MG/ML IJ SOLN
INTRAMUSCULAR | Status: AC
  Filled 2021-04-18: qty 1

## 2021-04-18 MED ORDER — PRENATAL MULTIVITAMIN CH
1.0000 | ORAL_TABLET | Freq: Every day | ORAL | Status: DC
  Administered 2021-04-19 – 2021-04-20 (×2): 1 via ORAL
  Filled 2021-04-18 (×2): qty 1

## 2021-04-18 MED ORDER — CEFAZOLIN SODIUM-DEXTROSE 2-4 GM/100ML-% IV SOLN: 2.0000 g | INTRAVENOUS | Status: DC

## 2021-04-18 MED ORDER — OXYCODONE HCL 5 MG PO TABS
5.0000 mg | ORAL_TABLET | Freq: Four times a day (QID) | ORAL | Status: DC | PRN
  Administered 2021-04-19: 5 mg via ORAL
  Filled 2021-04-18: qty 1

## 2021-04-18 MED ORDER — PHENYLEPHRINE 40 MCG/ML (10ML) SYRINGE FOR IV PUSH (FOR BLOOD PRESSURE SUPPORT)
PREFILLED_SYRINGE | INTRAVENOUS | Status: AC
  Filled 2021-04-18: qty 10

## 2021-04-18 MED ORDER — TETANUS-DIPHTH-ACELL PERTUSSIS 5-2.5-18.5 LF-MCG/0.5 IM SUSY: 0.5000 mL | PREFILLED_SYRINGE | Freq: Once | INTRAMUSCULAR | Status: DC

## 2021-04-18 MED ORDER — STERILE WATER FOR IRRIGATION IR SOLN
Status: DC | PRN
  Administered 2021-04-18: 1

## 2021-04-18 MED ORDER — ACETAMINOPHEN 160 MG/5ML PO SOLN: 1000.0000 mg | Freq: Once | ORAL | Status: DC

## 2021-04-18 MED ORDER — FENTANYL CITRATE (PF) 100 MCG/2ML IJ SOLN: 25.0000 ug | INTRAMUSCULAR | Status: DC | PRN

## 2021-04-18 MED ORDER — SIMETHICONE 80 MG PO CHEW
80.0000 mg | CHEWABLE_TABLET | ORAL | Status: DC | PRN
  Administered 2021-04-19: 80 mg via ORAL

## 2021-04-18 MED ORDER — NALOXONE HCL 4 MG/10ML IJ SOLN
1.0000 ug/kg/h | INTRAVENOUS | Status: DC | PRN
  Filled 2021-04-18: qty 5

## 2021-04-18 MED ORDER — COCONUT OIL OIL: 1.0000 "application " | TOPICAL_OIL | Status: DC | PRN

## 2021-04-18 MED ORDER — FENTANYL CITRATE (PF) 250 MCG/5ML IJ SOLN
INTRAMUSCULAR | Status: AC
  Filled 2021-04-18: qty 5

## 2021-04-18 MED ORDER — DIPHENHYDRAMINE HCL 50 MG/ML IJ SOLN
12.5000 mg | INTRAMUSCULAR | Status: DC | PRN
  Administered 2021-04-18 – 2021-04-19 (×2): 12.5 mg via INTRAVENOUS
  Filled 2021-04-18 (×2): qty 1

## 2021-04-18 MED ORDER — ZOLPIDEM TARTRATE 5 MG PO TABS: 5.0000 mg | ORAL_TABLET | Freq: Every evening | ORAL | Status: DC | PRN

## 2021-04-18 MED ORDER — DEXAMETHASONE SODIUM PHOSPHATE 4 MG/ML IJ SOLN
INTRAMUSCULAR | Status: DC | PRN
  Administered 2021-04-18: 4 mg via INTRAVENOUS

## 2021-04-18 MED ORDER — LEVONORGESTREL 20.1 MCG/DAY IU IUD: 1.0000 | INTRAUTERINE_SYSTEM | Freq: Once | INTRAUTERINE | Status: DC

## 2021-04-18 MED ORDER — SOD CITRATE-CITRIC ACID 500-334 MG/5ML PO SOLN
30.0000 mL | ORAL | Status: AC
  Administered 2021-04-18: 30 mL via ORAL
  Filled 2021-04-18: qty 30

## 2021-04-18 MED ORDER — PHENYLEPHRINE 40 MCG/ML (10ML) SYRINGE FOR IV PUSH (FOR BLOOD PRESSURE SUPPORT)
PREFILLED_SYRINGE | INTRAVENOUS | Status: AC
  Filled 2021-04-18: qty 20

## 2021-04-18 MED ORDER — OXYTOCIN-SODIUM CHLORIDE 30-0.9 UT/500ML-% IV SOLN
INTRAVENOUS | Status: DC | PRN
Start: 2021-04-18 — End: 2021-04-18
  Administered 2021-04-18: 300 mL via INTRAVENOUS

## 2021-04-18 MED ORDER — KETOROLAC TROMETHAMINE 30 MG/ML IJ SOLN
INTRAMUSCULAR | Status: AC
  Filled 2021-04-18: qty 1

## 2021-04-18 MED ORDER — ACETAMINOPHEN 500 MG PO TABS: 1000.0000 mg | ORAL_TABLET | Freq: Once | ORAL | Status: DC

## 2021-04-18 MED ORDER — DIPHENHYDRAMINE HCL 25 MG PO CAPS
25.0000 mg | ORAL_CAPSULE | ORAL | Status: DC | PRN
  Administered 2021-04-19: 25 mg via ORAL
  Filled 2021-04-18 (×2): qty 1

## 2021-04-18 MED ORDER — ONDANSETRON HCL 4 MG/2ML IJ SOLN: 4.0000 mg | Freq: Three times a day (TID) | INTRAMUSCULAR | Status: DC | PRN

## 2021-04-18 MED ORDER — SENNOSIDES-DOCUSATE SODIUM 8.6-50 MG PO TABS
2.0000 | ORAL_TABLET | Freq: Every day | ORAL | Status: DC
  Administered 2021-04-19 – 2021-04-20 (×2): 2 via ORAL
  Filled 2021-04-18 (×2): qty 2

## 2021-04-18 MED ORDER — SIMETHICONE 80 MG PO CHEW
80.0000 mg | CHEWABLE_TABLET | Freq: Three times a day (TID) | ORAL | Status: DC
  Administered 2021-04-18 – 2021-04-20 (×4): 80 mg via ORAL
  Filled 2021-04-18 (×5): qty 1

## 2021-04-18 MED ORDER — ACETAMINOPHEN 500 MG PO TABS: 1000.0000 mg | ORAL_TABLET | Freq: Four times a day (QID) | ORAL | Status: DC

## 2021-04-18 MED ORDER — PHENYLEPHRINE HCL-NACL 20-0.9 MG/250ML-% IV SOLN
INTRAVENOUS | Status: DC | PRN
  Administered 2021-04-18: 60 ug/min via INTRAVENOUS

## 2021-04-18 MED ORDER — HYDROXYZINE HCL 25 MG PO TABS
25.0000 mg | ORAL_TABLET | Freq: Four times a day (QID) | ORAL | Status: DC | PRN
  Administered 2021-04-18 – 2021-04-19 (×2): 25 mg via ORAL
  Filled 2021-04-18 (×2): qty 1

## 2021-04-18 MED ORDER — BUPIVACAINE IN DEXTROSE 0.75-8.25 % IT SOLN
INTRATHECAL | Status: DC | PRN
Start: 2021-04-18 — End: 2021-04-18
  Administered 2021-04-18: 1.6 mL via INTRATHECAL

## 2021-04-18 MED ORDER — SODIUM CHLORIDE 0.9 % IR SOLN
Status: DC | PRN
  Administered 2021-04-18: 1

## 2021-04-18 MED ORDER — ACETAMINOPHEN 500 MG PO TABS
1000.0000 mg | ORAL_TABLET | Freq: Four times a day (QID) | ORAL | Status: DC
  Administered 2021-04-18 – 2021-04-20 (×7): 1000 mg via ORAL
  Filled 2021-04-18 (×7): qty 2

## 2021-04-18 MED ORDER — ACETAMINOPHEN 10 MG/ML IV SOLN
INTRAVENOUS | Status: DC | PRN
Start: 2021-04-18 — End: 2021-04-18
  Administered 2021-04-18: 1000 mg via INTRAVENOUS

## 2021-04-18 MED ORDER — PROMETHAZINE HCL 25 MG/ML IJ SOLN: 6.2500 mg | INTRAMUSCULAR | Status: DC | PRN

## 2021-04-18 SURGICAL SUPPLY — 37 items
CHLORAPREP W/TINT 26ML (MISCELLANEOUS) ×2 IMPLANT
CLAMP CORD UMBIL (MISCELLANEOUS) IMPLANT
CLOTH BEACON ORANGE TIMEOUT ST (SAFETY) ×2 IMPLANT
DRSG OPSITE POSTOP 4X10 (GAUZE/BANDAGES/DRESSINGS) ×2 IMPLANT
ELECT REM PT RETURN 9FT ADLT (ELECTROSURGICAL) ×2
ELECTRODE REM PT RTRN 9FT ADLT (ELECTROSURGICAL) ×1 IMPLANT
EXTRACTOR VACUUM BELL STYLE (SUCTIONS) IMPLANT
GAUZE SPONGE 4X4 4PLY NS LF (GAUZE/BANDAGES/DRESSINGS) ×1 IMPLANT
GLOVE BIOGEL PI IND STRL 7.0 (GLOVE) ×2 IMPLANT
GLOVE BIOGEL PI INDICATOR 7.0 (GLOVE) ×2
GLOVE ECLIPSE 7.0 STRL STRAW (GLOVE) ×2 IMPLANT
GOWN STRL REUS W/TWL LRG LVL3 (GOWN DISPOSABLE) ×4 IMPLANT
KIT ABG SYR 3ML LUER SLIP (SYRINGE) ×2 IMPLANT
NDL HYPO 25X5/8 SAFETYGLIDE (NEEDLE) ×1 IMPLANT
NEEDLE HYPO 25X5/8 SAFETYGLIDE (NEEDLE) ×2 IMPLANT
NS IRRIG 1000ML POUR BTL (IV SOLUTION) ×2 IMPLANT
PACK C SECTION WH (CUSTOM PROCEDURE TRAY) ×2 IMPLANT
PAD ABD 8X10 STRL (GAUZE/BANDAGES/DRESSINGS) ×1 IMPLANT
PAD OB MATERNITY 4.3X12.25 (PERSONAL CARE ITEMS) ×2 IMPLANT
PENCIL SMOKE EVAC W/HOLSTER (ELECTROSURGICAL) ×2 IMPLANT
RTRCTR C-SECT PINK 25CM LRG (MISCELLANEOUS) ×2 IMPLANT
SUT MNCRL 0 VIOLET CTX 36 (SUTURE) ×2 IMPLANT
SUT MONOCRYL 0 CTX 36 (SUTURE) ×2
SUT PLAIN 0 NONE (SUTURE) IMPLANT
SUT PLAIN 2 0 (SUTURE)
SUT PLAIN 2 0 XLH (SUTURE) IMPLANT
SUT PLAIN ABS 2-0 CT1 27XMFL (SUTURE) IMPLANT
SUT VIC AB 0 CT1 27 (SUTURE) ×2
SUT VIC AB 0 CT1 27XBRD ANBCTR (SUTURE) IMPLANT
SUT VIC AB 0 CTX 36 (SUTURE) ×1
SUT VIC AB 0 CTX36XBRD ANBCTRL (SUTURE) ×1 IMPLANT
SUT VIC AB 2-0 CT1 27 (SUTURE) ×3
SUT VIC AB 2-0 CT1 TAPERPNT 27 (SUTURE) ×1 IMPLANT
SUT VIC AB 4-0 KS 27 (SUTURE) ×2 IMPLANT
TOWEL OR 17X24 6PK STRL BLUE (TOWEL DISPOSABLE) ×2 IMPLANT
TRAY FOLEY W/BAG SLVR 14FR LF (SET/KITS/TRAYS/PACK) IMPLANT
WATER STERILE IRR 1000ML POUR (IV SOLUTION) ×2 IMPLANT

## 2021-04-18 NOTE — [facility] Provider Note (Signed)
History     CSN: [id number]  Arrival date and time: 04/18/21 1245   Event Date/Time   First Provider Initiated Contact with Patient 04/18/21 1320      Chief Complaint  Patient presents with   Decreased Fetal Movement   HPI  Carly Brooks is a 26yo G2P1001 at [redacted]w[redacted]d who presents today for decreased fetal movement since yesterday afternoon. She typically notices baby move the most at night but she did not feel as much as usual last night. She went to her clinic appointment today and was found to have a nonreactive NST with no decels, baseline 150, and moderate variability. She has not felt baby move since arrival at [facility]. She has otherwise been feeling well. No pain, loss of fluid, headache, vision changes, chest pain, swelling. She has occasional mild contractions, approximately every 4 hours.   OB History     Gravida  2   Para  1   Term  1   Preterm      AB      Living  1      SAB      IAB      Ectopic      Multiple      Live Births  1           Past Medical History:  Diagnosis Date   Anxiety    Depression    Medical history non-contributory     Past Surgical History:  Procedure Laterality Date   CESAREAN SECTION      Family History  Problem Relation Age of Onset   Diabetes Mother     Social History   Tobacco Use   Smoking status: Never   Smokeless tobacco: Never  Vaping Use   Vaping Use: Never used  Substance Use Topics   Alcohol use: Not Currently    Alcohol/week: 2.0 standard drinks    Types: 2 Glasses of wine per week   Drug use: Never    Allergies: No Known Allergies  Medications Prior to Admission  Medication Sig Dispense Refill Last Dose   Prenatal Vit-Fe Fumarate-FA (PRENATAL VITAMINS PO) Take by mouth.   Past Week   sertraline (ZOLOFT) 50 MG tablet Take 1 tablet (50 mg total) by mouth daily. 90 tablet 1     Review of Systems  Eyes:  Negative for visual disturbance.  Respiratory:  Negative for chest tightness and  shortness of breath.   Cardiovascular:  Negative for chest pain and leg swelling.  Gastrointestinal:  Negative for abdominal pain, nausea and vomiting.  Neurological:  Negative for headaches.  Physical Exam   Blood pressure 124/80, pulse (!) 105, temperature 98 F (36.7 C), temperature source Oral, resp. rate 17, last menstrual period 08/02/2020.  Physical Exam Constitutional:      General: She is not in acute distress.    Appearance: She is not ill-appearing.  HENT:     Head: Normocephalic and atraumatic.  Cardiovascular:     Heart sounds: Normal heart sounds.  Pulmonary:     Effort: Pulmonary effort is normal.     Breath sounds: Normal breath sounds.  Abdominal:     Palpations: Abdomen is soft.     Tenderness: There is no abdominal tenderness.     Comments: Gravid  Skin:    General: Skin is warm and dry.  Neurological:     General: No focal deficit present.     Mental Status: She is alert.    MAU Course  Procedures NST:  Baseline: 150  Variability: minimal Accelerations: none  Decelerations: none Contractions:    MDM Received 1L LR bolus x2 6/10 BPP at 1400   Assessment and Plan  Pregnancy at [redacted]w[redacted]d Decreased fetal movement  NST with less variability after BPP. Will move towards delivery.  Pt NPO since 8am.  CBC, Type and Screen, RPR. Continue LR.  Desires IUD, which we will intraoperatively.  Reggy Eye 04/18/2021, 1:21 PM    Attestation of Supervision of Student:  I confirm that I have verified the information documented in the medical students note and that I have also personally reperformed the history, physical exam and all medical decision making activities.  I have verified that all services and findings are accurately documented in this student's note; and I agree with management and plan as outlined in the documentation. I have also made any necessary editorial changes.  Decreased fetal movement with BPP 6/10 and tracing with minimal  variability. Will move towards delivery.  [place] for [employer], [place] [employer] 04/18/2021 3:17 PM

## 2021-04-18 NOTE — Lactation Note (Signed)
This note was copied from a baby's chart. Lactation Consultation Note  Patient Name: Carly Brooks Date: 04/18/2021 Reason for consult: Initial assessment;Mother's request;Early term 37-38.6wks;Infant < 6lbs;Breastfeeding assistance Age:28 hours  LC assisted with latching infant at the breast with signs of milk transfer. Infant still feeding at the end of the visit.   Mom feeding plan to EBF and requested the use of DEBP so she can offer back her own milk after pumping.  LPTI guidelines reviewed how to reduce calorie loss including keeping total feeding under 30 min.   Plan 1. To feed based on cues 8-12x 24hr period. Mom to offer breasts and look for signs of milk transfer.  2. Mom to supplement with EBM 5-10 ml per feeding from hand expression or pumping with spoon for small amounts, with pace bottle feeding and slow flow nipple for 5 ml or more.  3. DEBP q 3hrs for 15 min  All questions answered at the end of the visit.   Maternal Data Has patient been taught Hand Expression?: Yes Does the patient have breastfeeding experience prior to this delivery?: Yes How long did the patient breastfeed?: 2 to 3 weeks first child currently 54 months old  Feeding Mother's Current Feeding Choice: Breast Milk  LATCH Score Latch: Grasps breast easily, tongue down, lips flanged, rhythmical sucking.  Audible Swallowing: Spontaneous and intermittent  Type of Nipple: Everted at rest and after stimulation  Comfort (Breast/Nipple): Soft / non-tender  Hold (Positioning): Assistance needed to correctly position infant at breast and maintain latch.  LATCH Score: 9   Lactation Tools Discussed/Used Tools: Pump;Flanges Breast pump type: Double-Electric Breast Pump (LC did not assess flange size as infant latched and feeding during the visit. Mom to call once she is ready to pump for RN or LC assistance.) Pump Education: Setup, frequency, and cleaning;Milk Storage Reason for Pumping:  increase stimulation Pumping frequency: every 3 hrs for 15 min  Interventions Interventions: Skin to skin;Breast massage;Hand express;Breast compression;Adjust position;Support pillows;Position options;Expressed milk;DEBP;Education;Pace feeding;LC Psychologist, educational;Infant Driven Feeding Algorithm education  Discharge Pump: Manual;DEBP WIC Program: Yes  Consult Status Consult Status: Follow-up Date: 04/19/21 Follow-up type: In-patient    Carly Wax  Brooks 04/18/2021, 7:50 PM

## 2021-04-18 NOTE — Anesthesia Procedure Notes (Signed)
Spinal  Patient location during procedure: OR Start time: 04/18/2021 3:52 PM End time: 04/18/2021 3:57 PM Reason for block: surgical anesthesia Staffing Performed: anesthesiologist  Anesthesiologist: Brennan Bailey, MD Preanesthetic Checklist Completed: patient identified, IV checked, risks and benefits discussed, monitors and equipment checked, pre-op evaluation and timeout performed Spinal Block Patient position: sitting Prep: DuraPrep and site prepped and draped Patient monitoring: heart rate, continuous pulse ox and blood pressure Approach: midline Location: L3-4 Injection technique: single-shot Needle Needle type: Pencan  Needle gauge: 24 G Needle length: 10 cm Assessment Sensory level: T4 Events: CSF return Additional Notes Risks, benefits, and alternative discussed. Patient gave consent to procedure. Prepped and draped in sitting position. Clear CSF obtained on second attempt (somewhat difficult due to habitus and inadequate patient positioning). Positive terminal aspiration. No pain or paraesthesias with injection. Patient tolerated procedure well. Vital signs stable. Tawny Asal, MD

## 2021-04-18 NOTE — Transfer of Care (Signed)
Immediate Anesthesia Transfer of Care Note  Patient: Carly Brooks  Procedure(s) Performed: CESAREAN SECTION WITH BILATERAL TUBAL LIGATION  Patient Location: PACU  Anesthesia Type:Spinal  Level of Consciousness: awake, alert  and oriented  Airway & Oxygen Therapy: Patient Spontanous Breathing  Post-op Assessment: Report given to RN and Post -op Vital signs reviewed and stable  Post vital signs: Reviewed and stable  Last Vitals:  Vitals Value Taken Time  BP 99/79 04/18/21 1743  Temp 36.3 C 04/18/21 1743  Pulse 80 04/18/21 1745  Resp 19 04/18/21 1745  SpO2 100 % 04/18/21 1745  Vitals shown include unvalidated device data.  Last Pain:  Vitals:   04/18/21 1743  TempSrc: Oral  PainSc:          Complications: No notable events documented.

## 2021-04-18 NOTE — Op Note (Addendum)
Operative Note   Patient: Carly Brooks  Date of Procedure: 04/18/2021  Procedure: Repeat Low Transverse Cesarean   Indications: non-reassuring fetal status  Pre-operative Diagnosis: RCS non-reassurring Fetal Heart Rate.   Post-operative Diagnosis: Same  TOLAC Candidate: No  Surgeon: Surgeon(s) and Role:    * Yekaterina Escutia, Mary Sella, MD - Primary    * Warden Fillers, MD - Assisting    * Warner Mccreedy, MD   An experienced assistant was required given the standard of surgical care given the complexity of the case.  This assistant was needed for exposure, dissection, suctioning, retraction, instrument exchange, assisting with delivery with administration of fundal pressure, and for overall help during the procedure.   Anesthesia: spinal  Antibiotics: Cefazolin   Estimated Blood Loss: 384 ml   Total IV Fluids: 1300 ml  Urine Output:  100 cc OF clear urine  Specimens: None   Complications: Extensive scarring and adhesions of the rectus muscles and peritoneum   Indications: Carly Brooks is a 26 y.o. [phone] with an IUP [redacted]w[redacted]d presenting for unscheduled cesarean secondary to the indications listed above. Clinical course notable for patient feeling decrease fetal movement for 1 day and non-reactive NST at her prenatal visit as well as in MAU with predominantly minimal variability in FHR. BPP performed was 6/10. Given concern for fetal status at term she was taken for repeat cesarean.  The risks of cesarean section discussed with the patient included but were not limited to: bleeding which may require transfusion or reoperation; infection which may require antibiotics; injury to bowel, bladder, ureters or other surrounding organs; injury to the fetus; need for additional procedures including hysterectomy in the event of a life-threatening hemorrhage; placental abnormalities with subsequent pregnancies, incisional problems, thromboembolic phenomenon and other  postoperative/anesthesia complications. The patient concurred with the proposed plan, giving informed written consent for the procedure. Patient NPO status waived given urgency of case. Anesthesia and OR aware. Preoperative prophylactic antibiotics and SCDs ordered on call to the OR.   Findings: Viable infant in cephalic presentation, no nuchal cord present. Apgars 9 , 9 , . Weight 2670 g . Clear amniotic fluid. Normal though somewhat adherent placenta, three vessel cord. Dense, thick adhesions of the abdominal wall. Normal uterus that was both rotated to patients left as well as levorotated, Normal bilateral fallopian tubes, Normal bilateral ovaries.  Procedure Details: A Time Out was held and the above information confirmed. The patient received intravenous antibiotics and had sequential compression devices applied to her lower extremities preoperatively. The patient was taken back to the operative suite where spinal anesthesia was administered. After induction of anesthesia, the patient was draped and prepped in the usual sterile manner and placed in a dorsal supine position with a leftward tilt. A low transverse skin incision was made with scalpel at site of previous incision and carried down through the subcutaneous tissue to the fascia. Fascial incision was made and extended transversely. The fascia was separated from the underlying rectus tissue superiorly and inferiorly.   Attention was then placed on the rectus muscles. Due to extensive scar tissue sharp dissection technique was utilized using mayo scissors. Given extensive scarring Dr. Donavan Foil was called to the OR to assist in the case. There was extensive scarring at the midline requiring attempts in order to enter the peritoneum with very carefully dissection. Upon stretching the tissue to separate at midline the space was felt to be limited especially on patient's left. At that time the bovie was utilized to carefully  separate part of the muscle.  There was some bleeding noted at the site of separation and subsequently the two ends of the separated muscle were tied off with 0-vicryl suture and appropriate hemostasis was noted.   At that time the opening was inspected again and found to have adequate space and an Alexis retractor was placed to aid in visualization of the uterus. A bladder flap was not developed. A low transverse uterine incision was made. At that time the head was attempted to be delivered by Dr. Donavan Foil. Due to tight fit a vacuum was briefly applied and there was one pop off. At that time the vacuum was not used further and instead upon use of extensive constant fundal pressure head was successfully delivered. The infant was successfully delivered from cephalic presentation, the umbilical cord was clamped after 1 minute. Cord ph was sent and arterial pH was found to be 7.3 and cord blood was obtained for evaluation. The placenta was removed Intact though was somewhat adherent and appeared normal.   A Liletta IUD was then removed from it's packaging in a sterile manner and the strings were trimmed to approximately 10 cm. The IUD was placed manually at the uterine fundus and the strings were passed through the cervical os with a Kelly clamp.   The uterine incision was closed with running locked sutures of 0-Monocryl, and then a second imbricating layer was also placed with 0-Monocryl. Overall, excellent hemostasis was noted. The abdomen and the pelvis were cleared of all clot and debris and the Jon Gills was removed. Hemostasis was confirmed on all surfaces.  The peritoneum was was not reapproximated. The fascia was then closed using 0 Vicryl in a running fashion. The subcutaneous layer was reapproximated with plain gut and the skin was closed with a 4-0 vicryl subcuticular stitch. The patient tolerated the procedure well. Sponge, lap, instrument and needle counts were correct x 2. She was taken to the recovery room in stable  condition.  Disposition: PACU - hemodynamically stable.    Signed: Warner Mccreedy, MD, MPH [facility])    Attestation of Attending Supervision of Obstetric Fellow During Surgery: An experienced assistant was required given the standard of surgical care given the complexity of the case.  This assistant was needed for exposure, dissection, suctioning, retraction, instrument exchange, assisting with delivery with administration of fundal pressure, and for overall help during the procedure. Surgery was performed with the Obstetric Fellow under my supervision and collaboration.  I was present and scrubbed for the entire procedure.   I have reviewed the Obstetric Fellow's operative report, and I agree with the documentation. I have also made any necessary editorial changes.  Venora Maples, MD/MPH Attending Family Medicine Physician, [facility] for [facility], [facility]

## 2021-04-18 NOTE — H&P (Signed)
LABOR AND DELIVERY ADMISSION HISTORY AND PHYSICAL NOTE  Carly Brooks is a 26 y.o. female G2P1001 with IUP at [redacted]w[redacted]d by 28wk [place] presenting for decreased fetal movement.   She reports she has had decreased fetal movement since yesterday. She went to her OB appt today and had a non-reactive NST so was sent to MAU for further evaluation.   No contractions, leaking fluid, or vaginal bleeding.   She plans on breast and bottle feeding. She requests post placental Liletta IUD for birth control.  Prenatal History/Complications: PNC at [facility]:  @[redacted]w[redacted]d , CWD, normal anatomy, cephalic presentation, posterior placenta, 65%ile, EFW 2816g  Pregnancy complications:  - obesity - history of cesarean delivery  Past Medical History: Past Medical History:  Diagnosis Date   Anxiety    Depression    Medical history non-contributory     Past Surgical History: Past Surgical History:  Procedure Laterality Date   CESAREAN SECTION      Obstetrical History: OB History     Gravida  2   Para  1   Term  1   Preterm      AB      Living  1      SAB      IAB      Ectopic      Multiple      Live Births  1           Social History: Social History   Socioeconomic History   Marital status: Single    Spouse name: Not on file   Number of children: Not on file   Years of education: Not on file   Highest education level: Not on file  Occupational History   Not on file  Tobacco Use   Smoking status: Never   Smokeless tobacco: Never  Vaping Use   Vaping Use: Never used  Substance and Sexual Activity   Alcohol use: Not Currently    Alcohol/week: 2.0 standard drinks    Types: 2 Glasses of wine per week   Drug use: Never   Sexual activity: Yes  Other Topics Concern   Not on file  Social History Narrative   Not on file   Social Determinants of Health   Financial Resource Strain: Not on file  Food Insecurity: Not on file  Transportation Needs: Not on  file  Physical Activity: Not on file  Stress: Not on file  Social Connections: Not on file    Family History: Family History  Problem Relation Age of Onset   Diabetes Mother     Allergies: No Known Allergies  Medications Prior to Admission  Medication Sig Dispense Refill Last Dose   Prenatal Vit-Fe Fumarate-FA (PRENATAL VITAMINS PO) Take by mouth.   Past Week   sertraline (ZOLOFT) 50 MG tablet Take 1 tablet (50 mg total) by mouth daily. 90 tablet 1      Review of Systems  All systems reviewed and negative except as stated in HPI  Physical Exam Blood pressure 124/80, pulse (!) 105, temperature 98 F (36.7 C), temperature source Oral, resp. rate 17, last menstrual period 08/02/2020. General appearance: alert, oriented, NAD Lungs: normal respiratory effort Heart: regular rate Abdomen: soft, non-tender; gravid Extremities: No calf swelling or tenderness FHR: Cat II, non-reactive strip  Prenatal labs: ABO, Rh: --/--/PENDING (01/30 1330) Antibody: PENDING (01/30 1330) Rubella: 2.10 (12/22 1159) RPR: Non Reactive (12/22 1159)  HBsAg: Negative (12/22 1159)  HIV: Non Reactive (12/22 1159)  GC/Chlamydia: neg/neg  GBS: Positive/-- (01/23 1009)  2-hr GTT: 1hr GTT normal 03/10/21 Genetic screening:  not done Anatomy US: normal  Prenatal Transfer Tool  Maternal Diabetes: No Genetic Screening: Declined Maternal Ultrasounds/Referrals: Normal Fetal Ultrasounds or other Referrals:  None Maternal Substance Abuse:  No Significant Maternal Medications:  None Significant Maternal Lab Results: Group B Strep positive  Results for orders placed or performed during the hospital encounter of 04/18/21 (from the past 24 hour(s))  Type and screen [facility]   Collection Time: 04/18/21  1:30 PM  Result Value Ref Range   ABO/RH(D) PENDING    Antibody Screen PENDING    Sample Expiration      04/21/2021,2359 Performed at [facility], [address]  [place], [place] [address]   CBC   Collection Time: 04/18/21  2:45 PM  Result Value Ref Range   WBC 8.9 4.0 - 10.5 K/uL   RBC 4.14 3.87 - 5.11 MIL/uL   Hemoglobin 9.5 (L) 12.0 - 15.0 g/dL   HCT 20.3 (L) 55.9 - 74.1 %   MCV 75.8 (L) 80.0 - 100.0 fL   MCH 22.9 (L) 26.0 - 34.0 pg   MCHC 30.3 30.0 - 36.0 g/dL   RDW 63.8 (H) 45.3 - 64.6 %   Platelets 285 150 - 400 K/uL   nRBC 0.2 0.0 - 0.2 %    Patient Active Problem List   Diagnosis Date Noted   Supervision of other normal pregnancy, antepartum 03/10/2021   Late prenatal care 03/10/2021   History of cesarean delivery 03/10/2021   Obesity, morbid (HCC) 02/16/2017   Depression 06/28/2015    Assessment: Carly Brooks is a 26 y.o. G2P1001 at [redacted]w[redacted]d here for scheduled CS.  #RCS: #Non-reassuring fetal status at term Patient presents to MAU with decreased fetal movement for almost one day as well as concerning tracing with minimal to moderate variability and no accels. BPP 6/10 today in MAU. Given patient is term today will proceed with delivery, she desires repeat cesarean.  Of note patient reports she became pregnant with Mirena IUD in place, will do diligent examination of uterine cavity and placenta.   The risks of cesarean section discussed with the patient included but were not limited to: bleeding which may require transfusion or reoperation; infection which may require antibiotics; injury to bowel, bladder, ureters or other surrounding organs; injury to the fetus; need for additional procedures including hysterectomy in the event of a life-threatening hemorrhage; placental abnormalities with subsequent pregnancies, incisional problems, thromboembolic phenomenon and other postoperative/anesthesia complications. The patient concurred with the proposed plan, giving informed written consent for the procedure. Patient has been NPO since last night she will remain NPO for procedure. Anesthesia and OR aware. Preoperative prophylactic  antibiotics and SCDs ordered on call to the OR. To OR when ready.   #Anesthesia: Spinal #FWB: Cat II tracing, non-reactive #GBS/ID: positive #COVID: swab pending #MOF: bottle and breast #MOC: post placental Liletta IUD at time of cesarean   Venora Maples 04/18/2021, 3:31 PM

## 2021-04-18 NOTE — Anesthesia Postprocedure Evaluation (Signed)
Anesthesia Post Note  Patient: Carly Brooks  Procedure(s) Performed: CESAREAN SECTION WITH BILATERAL TUBAL LIGATION     Patient location during evaluation: PACU Anesthesia Type: Spinal Level of consciousness: awake and alert Pain management: pain level controlled Vital Signs Assessment: post-procedure vital signs reviewed and stable Respiratory status: spontaneous breathing, nonlabored ventilation and respiratory function stable Cardiovascular status: blood pressure returned to baseline Postop Assessment: no apparent nausea or vomiting, spinal receding, no headache and no backache Anesthetic complications: no   No notable events documented.  Last Vitals:  Vitals:   04/18/21 1852 04/18/21 2000  BP: 123/75 107/70  Pulse: 85 87  Resp: 20 20  Temp: 36.8 C 36.5 C  SpO2: 100% 96%    Last Pain:  Vitals:   04/18/21 2000  TempSrc: Oral  PainSc: 0-No pain                 Marthenia Rolling

## 2021-04-18 NOTE — Anesthesia Preprocedure Evaluation (Addendum)
Anesthesia Evaluation  Patient identified by MRN, date of birth, ID band Patient awake    Reviewed: Allergy & Precautions, NPO status , Patient's Chart, lab work & pertinent test results  History of Anesthesia Complications Negative for: history of anesthetic complications  Airway Mallampati: II  TM Distance: >3 FB Neck ROM: Full    Dental no notable dental hx.    Pulmonary neg pulmonary ROS,    Pulmonary exam normal        Cardiovascular negative cardio ROS Normal cardiovascular exam     Neuro/Psych Anxiety Depression negative neurological ROS     GI/Hepatic negative GI ROS, Neg liver ROS,   Endo/Other  negative endocrine ROS  Renal/GU negative Renal ROS  negative genitourinary   Musculoskeletal negative musculoskeletal ROS (+)   Abdominal   Peds  Hematology  (+) anemia , Hgb 9.5, Plt 285k   Anesthesia Other Findings Day of surgery medications reviewed with patient.  Reproductive/Obstetrics (+) Pregnancy (Hx of C/S x1)                            Anesthesia Physical Anesthesia Plan  ASA: 2  Anesthesia Plan: Spinal   Post-op Pain Management:    Induction:   PONV Risk Score and Plan: 4 or greater and Treatment may vary due to age or medical condition, Ondansetron and Dexamethasone  Airway Management Planned: Natural Airway  Additional Equipment: None  Intra-op Plan:   Post-operative Plan:   Informed Consent: I have reviewed the patients History and Physical, chart, labs and discussed the procedure including the risks, benefits and alternatives for the proposed anesthesia with the patient or authorized representative who has indicated his/her understanding and acceptance.       Plan Discussed with: CRNA  Anesthesia Plan Comments:        Anesthesia Quick Evaluation

## 2021-04-18 NOTE — MAU Note (Signed)
.  Carly Brooks is a 26 y.o. at [redacted]w[redacted]d here in MAU reporting: DFM since yesterday afternoon. Sent from office for BPP because NST was non reactive. Denies pain, VB or LOF. Wants RCS.  Pain score: 0

## 2021-04-18 NOTE — Discharge Summary (Signed)
Postpartum Discharge Summary  Date of Service updated     Patient Name: Carly Brooks DOB: April 21, 1995 MRN: [id number]  Date of admission: 04/18/2021 Delivery date:04/18/2021  Delivering provider: Clarnce Flock  Date of discharge: 04/20/2021  Admitting diagnosis: Supervision of other normal pregnancy, antepartum [Z34.80] Intrauterine pregnancy: [redacted]w[redacted]d    Secondary diagnosis:  Principal Problem:   Supervision of other normal pregnancy, antepartum Active Problems:   Late prenatal care   History of cesarean delivery   Obesity, morbid ([place]   Status post repeat low transverse cesarean section  Additional problems: None    Discharge diagnosis: Term Pregnancy Delivered                                              Post partum procedures: None Augmentation: N/A Complications: None  Hospital course: Sceduled C/S   26y.o. yo G2P2002 at 381w0das admitted to the hospital 04/18/2021 for  cesarean section with the following indication: repeat in setting of non reassuring fetal heart tracing and decreased fetal movement .Delivery details are as follows:  Membrane Rupture Time/Date: 4:46 PM ,04/18/2021   Delivery Method:C-Section, Vacuum Assisted  Details of operation can be found in separate operative note.  Patient had an uncomplicated postpartum course.  She is ambulating, tolerating a regular diet, passing flatus, and urinating well. Patient is discharged home in stable condition on  04/20/21        Newborn Data: Birth date:04/18/2021  Birth time:4:48 PM  Gender:Female  Living status:Living  Apgars:9 ,9  Weight:2670 g     Magnesium Sulfate received: No BMZ received: No Rhophylac:N/A MMR:N/A T-DaP:Given prenatally Flu: Given prenatally Transfusion:No  Physical exam  Vitals:   04/19/21 0910 04/19/21 1414 04/19/21 2115 04/20/21 0545  BP: 111/61 112/65 120/80 138/77  Pulse: 77 81 (!) 102 83  Resp: _0 Temp: 98 F (36.7 C) 98.3 F (36.8 C) 98.4 F (36.9  C) 98.4 F (36.9 C)  TempSrc: Oral Oral Oral Oral  SpO2:  100% 100% 100%   General: alert, cooperative, and no distress Lochia: appropriate Uterine Fundus: firm Incision: Dressing is clean, dry, and intact DVT Evaluation: No significant calf/ankle edema. Labs: Lab Results  Component Value Date   WBC 22.2 (H) 04/19/2021   HGB 9.5 (L) 04/19/2021   HCT 31.5 (L) 04/19/2021   MCV 76.6 (L) 04/19/2021   PLT 297 04/19/2021   CMP Latest Ref Rng & Units 02/18/2021  Glucose 70 - 99 mg/dL 100(H)  BUN 6 - 20 mg/dL 5(L)  Creatinine 0.44 - 1.00 mg/dL 0.51  Sodium 135 - 145 mmol/L 133(L)  Potassium 3.5 - 5.1 mmol/L 3.9  Chloride 98 - 111 mmol/L 105  CO2 22 - 32 mmol/L 20(L)  Calcium 8.9 - 10.3 mg/dL 9.3  Total Protein 6.5 - 8.1 g/dL 7.4  Total Bilirubin 0.3 - 1.2 mg/dL 0.4  Alkaline Phos 38 - 126 U/L 140(H)  AST 15 - 41 U/L 14(L)  ALT 0 - 44 U/L 8   Edinburgh Score: Edinburgh Postnatal Depression Scale Screening Tool 04/19/2021  I have been able to laugh and see the funny side of things. 1  I have looked forward with enjoyment to things. 1  I have blamed myself unnecessarily when things went wrong. 0  I have been anxious or worried for no good reason. 3  I have  felt scared or panicky for no good reason. 2  Things have been getting on top of me. 2  I have been so unhappy that I have had difficulty sleeping. 1  I have felt sad or miserable. 2  I have been so unhappy that I have been crying. 0  The thought of harming myself has occurred to me. 0  Edinburgh Postnatal Depression Scale Total 12     After visit meds:  Allergies as of 04/20/2021   No Known Allergies      Medication List     TAKE these medications    acetaminophen 500 MG tablet Commonly known as: TYLENOL Take 2 tablets (1,000 mg total) by mouth every 6 (six) hours as needed.   ferrous sulfate 325 (65 FE) MG tablet Take 1 tablet (325 mg total) by mouth every other day. Start taking on: April 21, 2021    ibuprofen 600 MG tablet Commonly known as: ADVIL Take 1 tablet (600 mg total) by mouth every 6 (six) hours as needed.   oxyCODONE 5 MG immediate release tablet Commonly known as: Oxy IR/ROXICODONE Take 1 tablet (5 mg total) by mouth every 6 (six) hours as needed for severe pain.   PRENATAL VITAMINS PO Take by mouth.   senna-docusate 8.6-50 MG tablet Commonly known as: Senokot-S Take 2 tablets by mouth daily as needed for mild constipation.   sertraline 50 MG tablet Commonly known as: ZOLOFT Take 1 tablet (50 mg total) by mouth daily.         Discharge home in stable condition Infant Feeding: Breast Infant Disposition:home with mother Discharge instruction: per After Visit Summary and Postpartum booklet. Activity: Advance as tolerated. Pelvic rest for 6 weeks.  Diet: routine diet Future Appointments: Future Appointments  Date Time Provider [place]  04/26/2021 10:55 AM Seabron Spates, CNM [facility] None  05/30/2021  9:55 AM Anyanwu, Sallyanne Havers, MD [facility] None   Follow up Visit: Message sent to HP by Dr. Cy Blamer on 04/18/2021  Please schedule this patient for a In person postpartum visit in 4 weeks with the following provider: Any provider. Additional Postpartum F/U:Incision check 1 week + BP check  Low risk pregnancy complicated by:  None Delivery mode:  C-Section, Vacuum Assisted  Anticipated Birth Control:  PP IUD placed (liletta)   04/20/2021 Patriciaann Clan, DO

## 2021-04-18 NOTE — Progress Notes (Signed)
° °  PRENATAL VISIT NOTE  Subjective:  Carly Brooks is a 26 y.o. G2P1001 at [redacted]w[redacted]d being seen today for ongoing prenatal care.  She is currently monitored for the following issues for this low-risk pregnancy and has Supervision of other normal pregnancy, antepartum; Late prenatal care; History of cesarean delivery; Obesity, morbid ([place]); and Depression on their problem list.  Patient reports  decreased FM .  Contractions: Irritability. Vag. Bleeding: None.  Movement: Present. Denies leaking of fluid.   The following portions of the patient's history were reviewed and updated as appropriate: allergies, current medications, past family history, past medical history, past social history, past surgical history and problem list.   Objective:   Vitals:   04/18/21 1115  BP: 106/63  Pulse: 91  Weight: 282 lb (127.9 kg)    Fetal Status: Fetal Heart Rate (bpm): 150   Movement: Present     General:  Alert, oriented and cooperative. Patient is in no acute distress.  Skin: Skin is warm and dry. No rash noted.   Cardiovascular: Normal heart rate noted  Respiratory: Normal respiratory effort, no problems with respiration noted  Abdomen: Soft, gravid, appropriate for gestational age.  Pain/Pressure: Absent     Pelvic: Cervical exam deferred        Extremities: Normal range of motion.  Edema: None  Mental Status: Normal mood and affect. Normal behavior. Normal judgment and thought content.   Assessment and Plan:  Pregnancy: G2P1001 at [redacted]w[redacted]d 1. Supervision of other normal pregnancy, antepartum - GBS pos - Decreased FM - NST nonreactive, but no decels. Baseline was 150s, mod variability. Will send to [facility] for [place] as no availability with MFM.  - Reviewed plan of care with pt and she is agreeable.   2. Late prenatal care  3. History of cesarean delivery Plan is for repeat/BTS - papers signed 1/9.   Term labor symptoms and general obstetric precautions including but not limited to vaginal  bleeding, contractions, leaking of fluid and fetal movement were reviewed in detail with the patient. Please refer to After Visit Summary for other counseling recommendations.   Return in about 1 week (around 04/25/2021) for OB VISIT, MD or APP.  Future Appointments  Date Time Provider [place]  04/21/2021  1:30 PM [facility] NURSE [facility]  04/21/2021  1:45 PM [facility] US6 [facility]  04/25/2021 11:15 AM Anyanwu, Sallyanne Havers, MD [facility] None  04/28/2021  1:45 PM [facility] NURSE [facility]  04/28/2021  2:00 PM [facility] US1 [facility]  05/02/2021 10:35 AM Anyanwu, Sallyanne Havers, MD [facility] None  05/03/2021  1:30 PM [facility] NURSE [facility]  05/03/2021  1:45 PM [facility] US6 [facility]    Radene Gunning, MD

## 2021-04-19 ENCOUNTER — Encounter (HOSPITAL_COMMUNITY): Payer: Self-pay | Admitting: Family Medicine

## 2021-04-19 LAB — CBC
HCT: 31.5 % — ABNORMAL LOW (ref 36.0–46.0)
Hemoglobin: 9.5 g/dL — ABNORMAL LOW (ref 12.0–15.0)
MCH: 23.1 pg — ABNORMAL LOW (ref 26.0–34.0)
MCHC: 30.2 g/dL (ref 30.0–36.0)
MCV: 76.6 fL — ABNORMAL LOW (ref 80.0–100.0)
Platelets: 297 10*3/uL (ref 150–400)
RBC: 4.11 MIL/uL (ref 3.87–5.11)
RDW: 17.1 % — ABNORMAL HIGH (ref 11.5–15.5)
WBC: 22.2 10*3/uL — ABNORMAL HIGH (ref 4.0–10.5)
nRBC: 0.1 % (ref 0.0–0.2)

## 2021-04-19 LAB — RPR: RPR Ser Ql: NONREACTIVE

## 2021-04-19 MED ORDER — ENOXAPARIN SODIUM 80 MG/0.8ML IJ SOSY
65.0000 mg | PREFILLED_SYRINGE | INTRAMUSCULAR | Status: DC
  Administered 2021-04-20: 65 mg via SUBCUTANEOUS
  Filled 2021-04-19: qty 0.8

## 2021-04-19 MED ORDER — LACTATED RINGERS IV BOLUS: 500.0000 mL | Freq: Once | INTRAVENOUS | Status: DC

## 2021-04-19 MED ORDER — FERROUS SULFATE 325 (65 FE) MG PO TABS
325.0000 mg | ORAL_TABLET | ORAL | Status: DC
  Administered 2021-04-19: 325 mg via ORAL
  Filled 2021-04-19: qty 1

## 2021-04-19 NOTE — Progress Notes (Addendum)
POSTPARTUM PROGRESS NOTE  POD #2  Subjective:  Carly Brooks is a 26 y.o. G2P2002 s/p rLTCS at [redacted]w[redacted]d. No acute events overnight. She reports she is doing well. She denies any problems with ambulating, voiding or po intake. Denies nausea or vomiting. She has passed flatus. Pain is well controlled.  Lochia is mild.  Objective: Blood pressure 115/74, pulse 92, temperature 98 F (36.7 C), temperature source Oral, resp. rate 20, last menstrual period 08/02/2020, SpO2 99 %, unknown if currently breastfeeding.  Physical Exam:  General: alert, cooperative and no distress Chest: no respiratory distress Heart: regular rate, distal pulses intact Uterine Fundus: firm, appropriately tender DVT Evaluation: No calf swelling or tenderness Extremities: minimal edema Skin: warm, dry; incision clean/dry/intact w/ honeycomb dressing in place  Recent Labs    04/18/21 1445 04/19/21 0432  HGB 9.5* 9.5*  HCT 31.4* 31.5*    Assessment/Plan: Carly Brooks is a 26 y.o. [id number] s/p rLTCS at [redacted]w[redacted]d for NRFHT/DFM.  POD#1 - Doing welll; pain well controlled.   Routine postpartum care  OOB, ambulated  Lovenox for VTE prophylaxis Acute blood loss Anemia: asymptomatic  Start po ferrous sulfate every other day  Contraception: Liletta in place  Feeding: breast  Dispo: Plan for discharge today or tomorrow. She would like to go home today, placed dc order to recheck blood pressure. If normal, will be able to go home (unless infant indication to stay, stated she wondered if infant may need extra assistance with feeding but was not sure).    LOS: 1 day   Leticia Penna, DO OB Fellow  04/19/2021, 7:53 AM

## 2021-04-19 NOTE — Clinical Social Work Maternal (Signed)
CLINICAL SOCIAL WORK MATERNAL/CHILD NOTE  Patient Details  Name: Carly Brooks MRN: [id number] Date of Birth: November 25, 1995  Date:  04/19/2021  Clinical Social Worker Initiating Note:  Kathrin Greathouse, [place] Date/Time: Initiated:  04/19/21/1000     Child's Name:  Carly Brooks   Biological Parents:  Mother, Father Carly Brooks 06/23/93, Carly Brooks 10-14-1991)   Need for Interpreter:  None   Reason for Referral:  Late or No Prenatal Care  , Behavioral Health Concerns   Address:  [place] [address]    Phone number:  [phone] (home)     Additional phone number:   Household Members/Support Persons (HM/SP):   Household Member/Support Person 1, Household Member/Support Person 2   HM/SP Name Relationship DOB or Age  HM/SP -1 Carly Brooks Significant Other October 14, 1991  HM/SP -2 Carly Brooks Daughter March 11, 2020  HM/SP -3        HM/SP -4        HM/SP -5        HM/SP -6        HM/SP -7        HM/SP -8          Natural Supports (not living in the home):      Professional Supports: None   Employment: Unemployed   Type of Work:     Education:  Programmer, systems   Homebound arranged:    Museum/gallery curator Resources:  Medicaid   Other Resources:  [facility]   Cultural/Religious Considerations Which May Impact Care:    Strengths:  Ability to meet basic needs  , Home prepared for child  , Psychotropic Medications   Psychotropic Medications:  Zoloft      Pediatrician:       Pediatrician List:   [place]      Pediatrician Fax Number:    Risk Factors/Current Problems:      Cognitive State:  Alert  , Able to Concentrate  , Linear Thinking     Mood/Affect:  Calm  , Bright     CSW Assessment:  CSW received consult for hx of Anxiety and Depression.  CSW met with MOB to offer support and complete assessment.  CSW met with MOB  at bedside and introduced CSW role. CSW observed MOB breastfeeding and bonding with the infant. MOB presented calm and welcomed CSW visit. MOB confirmed that the hospital demographic information on file is correct. MOB shared she lives with FOB Carly Brooks and her daughter Carly Brooks. MOB identified FOB as her support. MOB shared she is currently unemployed however Preston works Land at the [employer]. MOB reported she receives [facility] and is applying for FS.   CSW inquired about the Late [facility]. MOB reported that she did not find out she was pregnant until 28 weeks. MOB reported she had an IUD and it failed. MOB reported she established care as soon as she could. CSW informed MOB about the hospital drug screen policy. MOB made aware that CSW will follow the infant's UDS/CDS and make a report, if warranted. MOB denied CPS involvement.  CSW inquired about MOB mental health history. MOB acknowledged she has a mental health history for anxiety, depression, and borderline personality disorder. MOB reported she was diagnosed in year 2000. MOB reported in she was actively seeing a psychiatrist, and [facility] [place] before moving to [place]. The last time she met with her psychiatrist was in Sept-Aug 2022 and was given Zoloft for her symptoms. MOB reported she temporarily stopped taking the Zoloft during the pregnancy but restarted during the pregnancy to help with her symptoms. MOB reported she is actively looking to establish mental health treatment in [place]. CSW provided education regarding the baby blues period vs. perinatal mood disorders, discussed treatment and gave resources for mental health follow up. MOB was very Patent attorney. 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 MOB reported it lasted about a year. CSW recommended MOB complete a self-evaluation during the  postpartum time period using the New Mom Checklist from Postpartum Progress and encouraged MOB to contact a medical professional if symptoms are noted at any time. MOB reported she feels comfortable reaching out to her doctor if she has concerns. MOB denied SI/HI and Domestic Violence.    MOB reported she has all essential items for the infant except a crib for the infant because she was not expecting the infant for another few weeks. CSW offered MOB pack n play and MOB accepted. CSW provided review of Sudden Infant Death Syndrome (SIDS) precautions. MOB reported understanding. CSW educated MOB about Manufacturing systems engineer. MOB gave CSW permission to make a referral.   CSW provided a pack n play and made referral to [facility]. CSW identifies no further need for intervention and no barriers to discharge at this time.  CSW Plan/Description:  CSW Will Continue to Monitor Umbilical Cord Tissue Drug Screen Results and Make Report if Warranted, Sudden Infant Death Syndrome (SIDS) Education, [place], Other Information/Referral to [employer], Perinatal Mood and Anxiety Disorder (PMADs) Education, No Further Intervention Required/No Barriers to Discharge    Lia Hopping, LCSW 04/19/2021, 11:52 AM

## 2021-04-19 NOTE — Lactation Note (Signed)
This note was copied from a baby's chart. Lactation Consultation Note  Patient Name: Carly Brooks [id number] Date: 04/19/2021 Reason for consult: Follow-up assessment;1st time breastfeeding;Early term 37-38.6wks;Infant < 6lbs Age:28 hours  LC in to visit with P2 Mom of ET infant.  Baby at 1% weight loss.  Mom has been breastfeeding and supplementing baby with donor breast milk.  Mom states she has pumped one time.  Offered to assist her with another pumping.   When Mom had started pumping, baby showing some subtle feeding cues.  Mom states baby had just had 15 ml of DBM by bottle about 30 mins ago. Offered to assist with latching.  Turned off the pump and assisted Mom.  Baby latched easily in football hold on left breast.  Baby dropping her jaw with pauses.  Encouraged Mom to offer her breast with feeding cues, but to continue supplementing and pumping regularly every 3 hrs.   Feeding Nipple Type: Slow - flow  LATCH Score Latch: Grasps breast easily, tongue down, lips flanged, rhythmical sucking.  Audible Swallowing: A few with stimulation  Type of Nipple: Everted at rest and after stimulation  Comfort (Breast/Nipple): Soft / non-tender  Hold (Positioning): Assistance needed to correctly position infant at breast and maintain latch.  LATCH Score: 8   Lactation Tools Discussed/Used Tools: Pump;Flanges;Bottle Flange Size: 24;21 Breast pump type: Double-Electric Breast Pump;Manual Pump Education: Setup, frequency, and cleaning Reason for Pumping: support milk supply/<6 lbs/history of low milk supply Pumping frequency: Encouraged mom to double pump every 2-3 hrs Pumped volume: 0 mL  Interventions Interventions: Breast feeding basics reviewed;Assisted with latch;Skin to skin;Breast massage;Hand express;Breast compression;Adjust position;Support pillows;Position options;Hand pump;DEBP;Pace feeding  Consult Status Consult Status: Follow-up Date: 04/20/21 Follow-up type:  In-patient    Judee Clara 04/19/2021, 10:28 AM

## 2021-04-19 NOTE — Lactation Note (Signed)
This note was copied from a baby's chart. Lactation Consultation Note Mom stated she BF baby for 30 min. On the Lt. Breast and 10 min. On the Rt. Breast and baby is still acting hungry sucking on her hand. Reminded mom try not to BF longer than 30 minutes. Mom stated she remembered but the baby is hungry. LC asked mom if she would like to give DBM or formula. Mom stated DBM. Mom signed consent, LC prepared milk and demonstrated pace bottle feeding. Encouraged mom to call for [facility] for next feeding. Mom is itching really bad asking for RN.   Patient Name: Carly Brooks [id number] Date: 04/19/2021 Reason for consult: Mother's request;Early term 37-38.6wks Age:15 hours  Maternal Data    Feeding Mother's Current Feeding Choice: Breast Milk and Donor Milk Nipple Type: Slow - flow  LATCH Score                    Lactation Tools Discussed/Used    Interventions    Discharge    Consult Status Consult Status: Follow-up Date: 04/19/21 Follow-up type: In-patient    Greenleigh Kauth, Diamond Nickel 04/19/2021, 1:20 AM

## 2021-04-20 ENCOUNTER — Telehealth (HOSPITAL_COMMUNITY): Payer: Self-pay | Admitting: *Deleted

## 2021-04-20 DIAGNOSIS — Z1331 Encounter for screening for depression: Secondary | ICD-10-CM

## 2021-04-20 LAB — SURGICAL PATHOLOGY

## 2021-04-20 MED ORDER — OXYCODONE HCL 5 MG PO TABS
5.0000 mg | ORAL_TABLET | Freq: Four times a day (QID) | ORAL | 0 refills | Status: DC | PRN
Start: 2021-04-20 — End: 2023-01-24

## 2021-04-20 MED ORDER — IBUPROFEN 600 MG PO TABS: 600.0000 mg | ORAL_TABLET | Freq: Four times a day (QID) | ORAL | 0 refills | Status: DC | PRN

## 2021-04-20 MED ORDER — FERROUS SULFATE 325 (65 FE) MG PO TABS: 325.0000 mg | ORAL_TABLET | ORAL | 0 refills | Status: DC

## 2021-04-20 MED ORDER — SENNOSIDES-DOCUSATE SODIUM 8.6-50 MG PO TABS: 2.0000 | ORAL_TABLET | Freq: Every day | ORAL | 0 refills | Status: DC | PRN

## 2021-04-20 MED ORDER — ACETAMINOPHEN 500 MG PO TABS: 1000.0000 mg | ORAL_TABLET | Freq: Four times a day (QID) | ORAL | 0 refills | Status: DC | PRN

## 2021-04-20 NOTE — Telephone Encounter (Signed)
Inpatient EPDS = 12. Inpatient SW referral made for [facility].  Duffy Rhody, RN 04-20-21 at 2:00pm

## 2021-04-20 NOTE — Social Work (Signed)
CSW received consult due to score 12 on Edinburgh Depression Screen.    CSW met with MOB and completed a full assessment. CSW provided education regarding Baby Blues vs PMADs and provided MOB with resources for mental health follow up. CSW encouraged MOB to evaluate her mental health throughout the postpartum period with the use of the New Mom Checklist developed by Postpartum Progress as well as the [place] Postnatal Depression Scale and notify a medical professional if symptoms arise.     No concerns identified.   Kathrin Greathouse, MSW, LCSW Women's and [place] Worker  [phone] 04/20/2021  8:25 AM

## 2021-04-20 NOTE — Lactation Note (Signed)
This note was copied from a baby's chart. Lactation Consultation Note  Patient Name: Carly Brooks [id number] Date: 04/20/2021 Reason for consult: Follow-up assessment;Early term 37-38.6wks Age:28 hours  I provided Mom with hand-out "Bottle feeding your baby" for volume parameters and gave Mom an Nfant Standard nipple (Mom felt that the Similac yellow slow-flow was too fast).   Mom is not likely eligible for a pump through [facility], b/c she won't be likely choosing the breastfeeding only package. I asked Janett Billow, RN to see if Mom is eligible for a "Stork pump."  Mom was thankful for info.  Matthias Hughs [facility] 04/20/2021, 12:49 PM

## 2021-04-21 ENCOUNTER — Other Ambulatory Visit: Payer: Medicare (Managed Care)

## 2021-04-21 ENCOUNTER — Ambulatory Visit: Payer: Medicare (Managed Care)

## 2021-04-25 ENCOUNTER — Encounter: Payer: Medicare (Managed Care) | Admitting: Obstetrics & Gynecology

## 2021-04-26 ENCOUNTER — Encounter: Payer: Self-pay | Admitting: Advanced Practice Midwife

## 2021-04-26 ENCOUNTER — Ambulatory Visit (INDEPENDENT_AMBULATORY_CARE_PROVIDER_SITE_OTHER): Payer: Medicare (Managed Care) | Admitting: Advanced Practice Midwife

## 2021-04-26 ENCOUNTER — Other Ambulatory Visit: Payer: Self-pay

## 2021-04-26 VITALS — BP 129/78 | HR 68 | Wt 272.0 lb

## 2021-04-26 DIAGNOSIS — Z9189 Other specified personal risk factors, not elsewhere classified: Secondary | ICD-10-CM

## 2021-04-26 NOTE — Progress Notes (Signed)
GYNECOLOGY  ENCOUNTER NOTE  Subjective:   Carly Brooks is a 26 y.o. [id number] female here for a wound and BP check   Current complaints: Little pain, bleeding decreasinig.   Denies abnormal vaginal bleeding, discharge, pelvic pain, or other gynecologic concerns.   Had a Cesarean Delivery on 04/18/21 with no complications except adhesions.     Gynecologic History Patient's last menstrual period was 08/02/2020. Contraception: IUD  Postpartum (done in C/S) Last Pap: 12/22. Results were: normal   Obstetric History OB History  Gravida Para Term Preterm AB Living  2 2 2     2   SAB IAB Ectopic Multiple Live Births        0 2    # Outcome Date GA Lbr Len/2nd Weight Sex Delivery Anes PTL Lv  2 Term 04/18/21 [redacted]w[redacted]d  5 lb 14.2 oz (2.67 kg) F CS-Vac Spinal  LIV  1 Term 2021 [redacted]w[redacted]d   F CS-LTranv EPI N LIV    Past Medical History:  Diagnosis Date   Anxiety    Depression    Medical history non-contributory     Past Surgical History:  Procedure Laterality Date   CESAREAN SECTION     CESAREAN SECTION WITH BILATERAL TUBAL LIGATION N/A 04/18/2021   Procedure: CESAREAN SECTION WITH BILATERAL TUBAL LIGATION;  Surgeon: 04/20/2021, MD;  Location: [facility] LD ORS;  Service: Obstetrics;  Laterality: N/A;    Current Outpatient Medications on File Prior to Visit  Medication Sig Dispense Refill   acetaminophen (TYLENOL) 500 MG tablet Take 2 tablets (1,000 mg total) by mouth every 6 (six) hours as needed. 30 tablet 0   ferrous sulfate 325 (65 FE) MG tablet Take 1 tablet (325 mg total) by mouth every other day. 30 tablet 0   ibuprofen (ADVIL) 600 MG tablet Take 1 tablet (600 mg total) by mouth every 6 (six) hours as needed. 30 tablet 0   oxyCODONE (OXY IR/ROXICODONE) 5 MG immediate release tablet Take 1 tablet (5 mg total) by mouth every 6 (six) hours as needed for severe pain. 15 tablet 0   Prenatal Vit-Fe Fumarate-FA (PRENATAL VITAMINS PO) Take by mouth.     senna-docusate (SENOKOT-S)  8.6-50 MG tablet Take 2 tablets by mouth daily as needed for mild constipation. 20 tablet 0   sertraline (ZOLOFT) 50 MG tablet Take 1 tablet (50 mg total) by mouth daily. 90 tablet 1   No current facility-administered medications on file prior to visit.    No Known Allergies  Social History   Socioeconomic History   Marital status: Single    Spouse name: Not on file   Number of children: Not on file   Years of education: Not on file   Highest education level: Not on file  Occupational History   Not on file  Tobacco Use   Smoking status: Never   Smokeless tobacco: Never  Vaping Use   Vaping Use: Never used  Substance and Sexual Activity   Alcohol use: Not Currently    Alcohol/week: 2.0 standard drinks    Types: 2 Glasses of wine per week   Drug use: Never   Sexual activity: Yes  Other Topics Concern   Not on file  Social History Narrative   Not on file   Social Determinants of Health   Financial Resource Strain: Not on file  Food Insecurity: Not on file  Transportation Needs: Not on file  Physical Activity: Not on file  Stress: Not on file  Social Connections: Not on  file  Intimate Partner Violence: Not on file    Family History  Problem Relation Age of Onset   Diabetes Mother     The following portions of the patient's history were reviewed and updated as appropriate: allergies, current medications, past family history, past medical history, past social history, past surgical history and problem list.  Review of Systems Pertinent items noted in HPI and remainder of comprehensive ROS otherwise negative.   Objective:  BP 129/78    Pulse 68    Wt 272 lb (123.4 kg)    LMP 08/02/2020    BMI 41.36 kg/m  CONSTITUTIONAL: Well-developed, well-nourished female in no acute distress.  SKIN: Skin is warm and dry. No rash noted. Not diaphoretic NEUROLGIC: Alert and oriented to person, place, and time. PSYCHIATRIC: Normal mood and affect. Normal behavior. Normal judgment  and thought content. CARDIOVASCULAR: Normal heart rate note . ABDOMEN   Soft and nonteder   Incision healing well   Assessment:  Postoperative Day #8   Plan:  Doing well BP within normal limits Incision healing  Plan PP visit in 4 wks

## 2021-04-28 ENCOUNTER — Other Ambulatory Visit: Payer: Medicare (Managed Care)

## 2021-04-28 ENCOUNTER — Ambulatory Visit: Payer: Medicare (Managed Care)

## 2021-05-02 ENCOUNTER — Encounter: Payer: Medicare (Managed Care) | Admitting: Obstetrics & Gynecology

## 2021-05-03 ENCOUNTER — Other Ambulatory Visit: Payer: Self-pay

## 2021-05-03 ENCOUNTER — Ambulatory Visit (INDEPENDENT_AMBULATORY_CARE_PROVIDER_SITE_OTHER): Payer: Medicare (Managed Care) | Admitting: Advanced Practice Midwife

## 2021-05-03 ENCOUNTER — Ambulatory Visit: Payer: Medicare (Managed Care)

## 2021-05-03 ENCOUNTER — Encounter: Payer: Self-pay | Admitting: Advanced Practice Midwife

## 2021-05-03 DIAGNOSIS — O9089 Other complications of the puerperium, not elsewhere classified: Secondary | ICD-10-CM

## 2021-05-03 NOTE — Progress Notes (Signed)
° °  Subjective:    Patient ID: Carly Brooks, female    DOB: [birth year], 26 y.o.   MRN: [id number] This patient is POD #15 after a Cesarean Delivery.  Noted some "pus with an odor" coming from left side of incision.  Not painful   No fever  Other This is a new problem. The current episode started in the past 7 days. The problem occurs intermittently. The problem has been unchanged. Pertinent negatives include no abdominal pain, chills, fatigue, fever, headaches, myalgias or nausea. Nothing aggravates the symptoms. She has tried nothing for the symptoms.   Review of Systems  Constitutional:  Negative for chills, fatigue and fever.  Gastrointestinal:  Negative for abdominal pain and nausea.  Musculoskeletal:  Negative for myalgias.  Neurological:  Negative for headaches.      Objective:   Physical Exam Constitutional:      General: She is not in acute distress.    Appearance: She is not ill-appearing, toxic-appearing or diaphoretic.  HENT:     Head: Normocephalic.  Cardiovascular:     Rate and Rhythm: Normal rate.  Pulmonary:     Effort: Pulmonary effort is normal.  Abdominal:     Tenderness: There is no abdominal tenderness. There is no guarding or rebound.     Comments: Wound well-approximated, small amount of serous fluid draining from left end of wound. With pressure, larger amount of serous (yellow, not purulent) fluid expressed Unable to admit swab into incision.   Musculoskeletal:        General: Normal range of motion.     Cervical back: Normal range of motion.  Skin:    General: Skin is warm and dry.  Neurological:     General: No focal deficit present.  Psychiatric:        Mood and Affect: Mood normal.          Assessment & Plan:  Postoperative Day#15 Status post Cesarean Delivery Postoperative Wound Seroma  Instructed to keep it clean and dry Pt to watch for any dehiscence or purulence Will followup as scheduled for postop appointment

## 2021-05-03 NOTE — Progress Notes (Signed)
Patient states she noticed pus coming from her incision site yesterday. She also states the incision site has an odor. Armandina Stammer RN

## 2021-05-10 ENCOUNTER — Encounter: Payer: Medicare (Managed Care) | Admitting: Licensed Clinical Social Worker

## 2021-05-30 ENCOUNTER — Ambulatory Visit: Payer: Medicare (Managed Care) | Admitting: Obstetrics & Gynecology

## 2021-06-21 ENCOUNTER — Ambulatory Visit (INDEPENDENT_AMBULATORY_CARE_PROVIDER_SITE_OTHER): Payer: Medicare (Managed Care) | Admitting: Advanced Practice Midwife

## 2021-06-21 ENCOUNTER — Encounter: Payer: Self-pay | Admitting: Advanced Practice Midwife

## 2021-06-21 DIAGNOSIS — Z30432 Encounter for removal of intrauterine contraceptive device: Secondary | ICD-10-CM | POA: Diagnosis not present

## 2021-06-21 NOTE — Progress Notes (Signed)
? ? ?  Post Partum Visit Note ? ?Carly Brooks is a 26 y.o. [phone] female who presents for a postpartum visit. She is 9 weeks postpartum following a repeat cesarean section.  I have fully reviewed the prenatal and intrapartum course. The delivery was at 33 gestational weeks.  Anesthesia: spinal. Postpartum course has been uneventful except for small seroma which resolved. Baby is doing well. Baby is feeding by breast. Bleeding staining only. Bowel function is normal. Bladder function is normal. Patient is sexually active. Contraception method is IUD. Postpartum depression screening: negative. ? ?Requests removal of IUD  Wants all hormones out of her system ?Discussed IUD has very tiny dose of hormones ?Discussed risk of pregnancy ?Pt states she is aware of all this and wants it out ? ? ?The pregnancy intention screening data noted above was reviewed. Potential methods of contraception were discussed. The patient elected to proceed with No data recorded. ? ? ? ?Health Maintenance Due  ?Topic Date Due  ? COVID-19 Vaccine (1) Never done  ? ? ?The following portions of the patient's history were reviewed and updated as appropriate: allergies, current medications, past family history, past medical history, past social history, past surgical history, and problem list. ? ?Review of Systems ?Pertinent items noted in HPI and remainder of comprehensive ROS otherwise negative. ? ?Objective:  ?LMP 08/02/2020   ? ?General:  no distress  ? Breasts:  normal  ?Lungs: Normal effort  ?Heart:  Normal rate  ?Abdomen: soft, non-tender; bowel sounds normal; no masses,  no organomegaly   ?Wound well approximated incision  ?GU exam:  normal  ?     ?Assessment:  ? ? 1. Postpartum care and examination  ?     IUD removed ?     Discusssed contraception. Will use condoms and will consider Paragard IUD ? ? ?Normal postpartum exam.  ? ?Plan:  ? ?Essential components of care per ACOG recommendations: ? ?1.  Mood and well being: Patient  with negative depression screening today. Reviewed local resources for support.  ?- Patient tobacco use? No.   ?- hx of drug use? No.   ? ?2. Infant care and feeding:  ?-Patient currently breastmilk feeding? Yes. Discussed returning to work and pumping.  ?-Social determinants of health (SDOH) reviewed in EPIC. No concerns ? ?3. Sexuality, contraception and birth spacing ?- Patient does not want a pregnancy in the next year.   ?- Reviewed reproductive life planning. Reviewed contraceptive methods based on pt preferences and effectiveness.  Patient desired Female Condom today.   ?- Discussed birth spacing of 18 months ? ?4. Sleep and fatigue ?-Encouraged family/partner/community support of 4 hrs of uninterrupted sleep to help with mood and fatigue ? ?5. Physical Recovery  ?- Discussed patients delivery and complications. She describes her labor as good. ?- Patient had a C-section.  ?. Patient expressed understanding ?- Patient has urinary incontinence? No. ?- Patient is safe to resume physical and sexual activity ? ?6.  Health Maintenance ?- HM due items addressed No -   ?- Last pap smear  ?Diagnosis  ?Date Value Ref Range Status  ?03/10/2021   Final  ? - Negative for intraepithelial lesion or malignancy (NILM)  ? Pap smear not done at today's visit. 03/10/21 ?-Breast Cancer screening indicated? No.  ? ?7. Chronic Disease/Pregnancy Condition follow up: None ? ?- PCP follow up ? ?chiquita l wilson, CMA ?[facility]  ? ?Seabron Spates, CNM ? ?

## 2022-08-02 ENCOUNTER — Ambulatory Visit (INDEPENDENT_AMBULATORY_CARE_PROVIDER_SITE_OTHER): Payer: Medicare Other

## 2022-08-02 VITALS — BP 124/83 | HR 73 | Ht 68.0 in | Wt 267.9 lb

## 2022-08-02 DIAGNOSIS — N912 Amenorrhea, unspecified: Secondary | ICD-10-CM | POA: Diagnosis not present

## 2022-08-02 DIAGNOSIS — Z3201 Encounter for pregnancy test, result positive: Secondary | ICD-10-CM

## 2022-08-02 LAB — POCT URINE PREGNANCY: Preg Test, Ur: POSITIVE — AB

## 2022-08-02 MED ORDER — VITAFOL GUMMIES 3.33-0.333-34.8 MG PO CHEW: 3.0000 | CHEWABLE_TABLET | Freq: Every day | ORAL | 11 refills | Status: DC

## 2022-08-02 NOTE — Progress Notes (Signed)
Ms. Tealer presents today for UPT. She has no unusual complaints. LMP: 06/21/2022 Patient is [redacted]w[redacted]d EDD 03/28/2023    OBJECTIVE: Appears well, in no apparent distress.  OB History     Gravida  2   Para  2   Term  2   Preterm      AB      Living  2      SAB      IAB      Ectopic      Multiple  0   Live Births  2          Home UPT Result: [facility] In-Office UPT result: [facility] I have reviewed the patient's medical, obstetrical, social, and family histories, and medications.   ASSESSMENT: Positive pregnancy test  PLAN Prenatal care to be completed at: [facility] New [facility] Intake  New OB Provider

## 2022-09-20 ENCOUNTER — Emergency Department (HOSPITAL_COMMUNITY)
Admission: EM | Admit: 2022-09-20 | Discharge: 2022-09-20 | Disposition: A | Discharge status: Home / Self Care | Payer: Medicare Other | Attending: Emergency Medicine | Admitting: Emergency Medicine

## 2022-09-20 ENCOUNTER — Encounter (HOSPITAL_COMMUNITY): Payer: Self-pay | Admitting: Emergency Medicine

## 2022-09-20 ENCOUNTER — Other Ambulatory Visit: Payer: Self-pay

## 2022-09-20 DIAGNOSIS — R079 Chest pain, unspecified: Secondary | ICD-10-CM | POA: Insufficient documentation

## 2022-09-20 DIAGNOSIS — F419 Anxiety disorder, unspecified: Secondary | ICD-10-CM | POA: Insufficient documentation

## 2022-09-20 DIAGNOSIS — R519 Headache, unspecified: Secondary | ICD-10-CM | POA: Diagnosis present

## 2022-09-20 DIAGNOSIS — R030 Elevated blood-pressure reading, without diagnosis of hypertension: Secondary | ICD-10-CM | POA: Diagnosis not present

## 2022-09-20 LAB — CBC WITH DIFFERENTIAL/PLATELET
Abs Immature Granulocytes: 0.01 10*3/uL (ref 0.00–0.07)
Basophils Absolute: 0 10*3/uL (ref 0.0–0.1)
Basophils Relative: 1 %
Eosinophils Absolute: 0.1 10*3/uL (ref 0.0–0.5)
Eosinophils Relative: 1 %
HCT: 40.9 % (ref 36.0–46.0)
Hemoglobin: 12.7 g/dL (ref 12.0–15.0)
Immature Granulocytes: 0 %
Lymphocytes Relative: 33 %
Lymphs Abs: 1.5 10*3/uL (ref 0.7–4.0)
MCH: 27.1 pg (ref 26.0–34.0)
MCHC: 31.1 g/dL (ref 30.0–36.0)
MCV: 87.4 fL (ref 80.0–100.0)
Monocytes Absolute: 0.3 10*3/uL (ref 0.1–1.0)
Monocytes Relative: 7 %
Neutro Abs: 2.6 10*3/uL (ref 1.7–7.7)
Neutrophils Relative %: 58 %
Platelets: 278 10*3/uL (ref 150–400)
RBC: 4.68 MIL/uL (ref 3.87–5.11)
RDW: 13.6 % (ref 11.5–15.5)
WBC: 4.5 10*3/uL (ref 4.0–10.5)
nRBC: 0 % (ref 0.0–0.2)

## 2022-09-20 LAB — BASIC METABOLIC PANEL
Anion gap: 9 (ref 5–15)
BUN: 5 mg/dL — ABNORMAL LOW (ref 6–20)
CO2: 24 mmol/L (ref 22–32)
Calcium: 9.1 mg/dL (ref 8.9–10.3)
Chloride: 104 mmol/L (ref 98–111)
Creatinine, Ser: 0.75 mg/dL (ref 0.44–1.00)
GFR, Estimated: >60 mL/min (ref >60)
Glucose, Bld: 101 mg/dL — ABNORMAL HIGH (ref 70–99)
Potassium: 4.4 mmol/L (ref 3.5–5.1)
Sodium: 137 mmol/L (ref 135–145)

## 2022-09-20 NOTE — ED Triage Notes (Signed)
Patient arrives ambulatory by POV c/o chest heaviness to center chest ongoing over past couple days along with dizziness onset of yesterday. Patient states she had a follow up appt with an abortion clinic and was told her BP was elevated. Reports family hx of HTN but never been diagnosed. Reports having an abortion 3 weeks ago.

## 2022-09-20 NOTE — Discharge Instructions (Addendum)
Thankfully your blood pressure looks great today and all your blood work was normal.

## 2022-09-20 NOTE — ED Provider Notes (Signed)
[place] EMERGENCY DEPARTMENT AT [facility] Provider Note   CSN: [id number] Arrival date & time: 09/20/22  4098     History  Chief Complaint  Patient presents with   Chest Pain    Carly Brooks is a 27 y.o. female.  Patient is a 27 year old female with a history of anxiety who is presenting today with multiple complaints.  Patient recently had an abortion 3 weeks ago which was therapeutic at approximately 8 weeks.  There were no complications with the abortion she had minimal bleeding and is not currently bleeding now.  She did report when she was there that they told her she had elevated blood pressure.  For the last week she has been living in her car because she did not have a place to stay but says that is going to be ending soon.  However it has been very hot and she has been trying to drink plenty of fluids but is getting intermittent headaches and feels intermittently dizzy.  She reports that sometimes she feels like she just cannot catch her breath and it makes her feel anxious.  She currently is denying dizziness, headache or feelings of shortness of breath at this time.  She reports it comes and goes.  She has not had any abdominal pain, dysuria, frequency or urgency.  The history is provided by the patient.  Chest Pain      Home Medications Prior to Admission medications   Medication Sig Start Date End Date Taking? Authorizing Provider  acetaminophen (TYLENOL) 500 MG tablet Take 2 tablets (1,000 mg total) by mouth every 6 (six) hours as needed. Patient not taking: Reported on 05/03/2021 04/20/21   Allayne Stack, DO  ferrous sulfate 325 (65 FE) MG tablet Take 1 tablet (325 mg total) by mouth every other day. 04/21/21   Allayne Stack, DO  ibuprofen (ADVIL) 600 MG tablet Take 1 tablet (600 mg total) by mouth every 6 (six) hours as needed. 04/20/21   Allayne Stack, DO  levonorgestrel (LILETTA, 52 MG,) 20.1 MCG/DAY IUD 1 each by Intrauterine route  once.    [provider]  oxyCODONE (OXY IR/ROXICODONE) 5 MG immediate release tablet Take 1 tablet (5 mg total) by mouth every 6 (six) hours as needed for severe pain. 04/20/21   Allayne Stack, DO  Prenatal Vit-Fe Fumarate-FA (PRENATAL VITAMINS PO) Take by mouth.    [provider]  Prenatal Vit-Fe Phos-FA-Omega (VITAFOL GUMMIES) 3.33-0.333-34.8 MG CHEW Chew 3 tablets by mouth daily. 08/02/22   Hermina Staggers, MD  senna-docusate (SENOKOT-S) 8.6-50 MG tablet Take 2 tablets by mouth daily as needed for mild constipation. 04/20/21   Allayne Stack, DO  sertraline (ZOLOFT) 50 MG tablet Take 1 tablet (50 mg total) by mouth daily. 04/11/21   Milas Hock, MD      Allergies    Patient has no known allergies.    Review of Systems   Review of Systems  Cardiovascular:  Positive for chest pain.    Physical Exam Updated Vital Signs BP 118/65   Pulse 70   Temp 98.4 F (36.9 C) (Oral)   Resp 13   Ht 5\' 8"  (1.727 m)   Wt 120.2 kg   LMP 06/21/2022 Comment: recent abortion  SpO2 100%   Breastfeeding Unknown   BMI 40.29 kg/m  Physical Exam Vitals and nursing note reviewed.  Constitutional:      General: She is not in acute distress.    Appearance: She  is well-developed.  HENT:     Head: Normocephalic and atraumatic.  Eyes:     Pupils: Pupils are equal, round, and reactive to light.  Cardiovascular:     Rate and Rhythm: Normal rate and regular rhythm.     Heart sounds: Normal heart sounds. No murmur heard.    No friction rub.  Pulmonary:     Effort: Pulmonary effort is normal.     Breath sounds: Normal breath sounds. No wheezing or rales.  Abdominal:     General: Bowel sounds are normal. There is no distension.     Palpations: Abdomen is soft.     Tenderness: There is no abdominal tenderness. There is no guarding or rebound.  Musculoskeletal:        General: No tenderness. Normal range of motion.     Right lower leg: No edema.     Left lower leg: No edema.      Comments: No edema  Skin:    General: Skin is warm and dry.     Findings: No rash.  Neurological:     Mental Status: She is alert and oriented to person, place, and time. Mental status is at baseline.     Cranial Nerves: No cranial nerve deficit.  Psychiatric:        Behavior: Behavior normal.     ED Results / Procedures / Treatments   Labs (all labs ordered are listed, but only abnormal results are displayed) Labs Reviewed  BASIC METABOLIC PANEL - Abnormal; Notable for the following components:      Result Value   Glucose, Bld 101 (*)    BUN 5 (*)    All other components within normal limits  CBC WITH DIFFERENTIAL/PLATELET    EKG EKG Interpretation Date/Time:  Wednesday September 20 2022 07:50:42 EDT Ventricular Rate:  70 PR Interval:  145 QRS Duration:  84 QT Interval:  360 QTC Calculation: 389 R Axis:   22  Text Interpretation: Sinus rhythm Normal ECG Confirmed by Gwyneth Sprout ([id number]) on 09/20/2022 8:05:55 AM  Radiology No results found.  Procedures Procedures    Medications Ordered in ED Medications - No data to display  ED Course/ Medical Decision Making/ A&P                             Medical Decision Making Amount and/or Complexity of Data Reviewed Labs: ordered. Decision-making details documented in ED Course. ECG/medicine tests: ordered and independent interpretation performed. Decision-making details documented in ED Course.   Patient here today with multiple complaints however most likely etiology is from stress and anxiety.  Low suspicion for ACS however will ensure no evidence of anemia or electrolyte abnormalities as patient has been living in her car the last week and has been very hot as well as had a recent therapeutic abortion.  Low suspicion for any complication with the abortion as it is now 3 weeks out she is having no abdominal pain or vaginal bleeding.  She has no symptoms suggestive of infection at this time.  I independently interpreted  patient's EKG which is normal today.  Patient's blood pressure has looked great and is within normal range at 124/70 and 117/80 when I was in the room.  9:19 AM I independently interpreted patient's labs and CBC, BMP both within normal limits.  At this time patient remains hemodynamically stable with normal vital signs.  All the findings discussed with the patient.  At this time  feel that she is stable for discharge.        Final Clinical Impression(s) / ED Diagnoses Final diagnoses:  Anxiety    Rx / DC Orders ED Discharge Orders     None         Gwyneth Sprout, MD 09/20/22 [id number]

## 2022-09-22 ENCOUNTER — Encounter (HOSPITAL_COMMUNITY): Payer: Self-pay

## 2022-09-22 ENCOUNTER — Emergency Department (HOSPITAL_COMMUNITY)
Admission: EM | Admit: 2022-09-22 | Discharge: 2022-09-22 | Disposition: A | Discharge status: Home / Self Care | Payer: Medicare Other | Attending: Emergency Medicine | Admitting: Emergency Medicine

## 2022-09-22 ENCOUNTER — Emergency Department (HOSPITAL_COMMUNITY): Payer: Medicare Other

## 2022-09-22 ENCOUNTER — Other Ambulatory Visit: Payer: Self-pay

## 2022-09-22 DIAGNOSIS — R5383 Other fatigue: Secondary | ICD-10-CM | POA: Insufficient documentation

## 2022-09-22 LAB — BASIC METABOLIC PANEL
Anion gap: 10 (ref 5–15)
BUN: 6 mg/dL (ref 6–20)
CO2: 25 mmol/L (ref 22–32)
Calcium: 9.7 mg/dL (ref 8.9–10.3)
Chloride: 104 mmol/L (ref 98–111)
Creatinine, Ser: 0.83 mg/dL (ref 0.44–1.00)
GFR, Estimated: >60 mL/min (ref >60)
Glucose, Bld: 119 mg/dL — ABNORMAL HIGH (ref 70–99)
Potassium: 3.7 mmol/L (ref 3.5–5.1)
Sodium: 139 mmol/L (ref 135–145)

## 2022-09-22 LAB — TSH: TSH: 2.276 u[IU]/mL (ref 0.350–4.500)

## 2022-09-22 LAB — CBC
HCT: 42.7 % (ref 36.0–46.0)
Hemoglobin: 13 g/dL (ref 12.0–15.0)
MCH: 26.4 pg (ref 26.0–34.0)
MCHC: 30.4 g/dL (ref 30.0–36.0)
MCV: 86.6 fL (ref 80.0–100.0)
Platelets: 315 10*3/uL (ref 150–400)
RBC: 4.93 MIL/uL (ref 3.87–5.11)
RDW: 13.5 % (ref 11.5–15.5)
WBC: 6.1 10*3/uL (ref 4.0–10.5)
nRBC: 0 % (ref 0.0–0.2)

## 2022-09-22 LAB — TROPONIN I (HIGH SENSITIVITY)
Troponin I (High Sensitivity): <2 ng/L (ref <18)
Troponin I (High Sensitivity): 3 ng/L (ref <18)

## 2022-09-22 LAB — D-DIMER, QUANTITATIVE: D-Dimer, Quant: 0.38 ug/mL-FEU (ref 0.00–0.50)

## 2022-09-22 LAB — HCG, SERUM, QUALITATIVE: Preg, Serum: NEGATIVE

## 2022-09-22 NOTE — Discharge Instructions (Addendum)
You are seen in the emergency department for fatigue and some confusion.  Your labs were all unremarkable without any abnormalities noted.  I will advise following up with your family medicine provider and setting up care with them for further evaluation of your symptoms.  If you have any acute worsening of your symptoms please return the emergency department.

## 2022-09-22 NOTE — ED Provider Notes (Signed)
[place] EMERGENCY DEPARTMENT AT [facility] Provider Note   CSN: [id number] Arrival date & time: 09/22/22  1349     History Chief Complaint  Patient presents with   Multiple Complaints    Carly Brooks is a 27 y.o. female.  Patient presents to the emergency department with multiple complaints.  Patient seen in the emergency department for similar presentation 2 days ago.  She currently is endorsing a mild headache, shortness of breath, confusion.  Endorses alcohol use and marijuana use but denies any other substance use.  Per chart review, patient had an abortion 3 weeks ago at approximately 8 weeks of pregnancy.  No complications from this abortion and denies any bleeding at this moment.  Patient is currently living in her car but she did have a place to stay reports that this will be accommodated soon.   HPI     Home Medications Prior to Admission medications   Medication Sig Start Date End Date Taking? Authorizing Provider  acetaminophen (TYLENOL) 500 MG tablet Take 2 tablets (1,000 mg total) by mouth every 6 (six) hours as needed. Patient not taking: Reported on 05/03/2021 04/20/21   Allayne Stack, DO  ferrous sulfate 325 (65 FE) MG tablet Take 1 tablet (325 mg total) by mouth every other day. 04/21/21   Allayne Stack, DO  ibuprofen (ADVIL) 600 MG tablet Take 1 tablet (600 mg total) by mouth every 6 (six) hours as needed. 04/20/21   Allayne Stack, DO  levonorgestrel (LILETTA, 52 MG,) 20.1 MCG/DAY IUD 1 each by Intrauterine route once.    [provider]  oxyCODONE (OXY IR/ROXICODONE) 5 MG immediate release tablet Take 1 tablet (5 mg total) by mouth every 6 (six) hours as needed for severe pain. 04/20/21   Allayne Stack, DO  Prenatal Vit-Fe Fumarate-FA (PRENATAL VITAMINS PO) Take by mouth.    [provider]  Prenatal Vit-Fe Phos-FA-Omega (VITAFOL GUMMIES) 3.33-0.333-34.8 MG CHEW Chew 3 tablets by mouth daily. 08/02/22   Hermina Staggers,  MD  senna-docusate (SENOKOT-S) 8.6-50 MG tablet Take 2 tablets by mouth daily as needed for mild constipation. 04/20/21   Allayne Stack, DO  sertraline (ZOLOFT) 50 MG tablet Take 1 tablet (50 mg total) by mouth daily. 04/11/21   Milas Hock, MD      Allergies    Patient has no known allergies.    Review of Systems   Review of Systems  Neurological:  Positive for headaches.  All other systems reviewed and are negative.   Physical Exam Updated Vital Signs BP (!) 101/53 (BP Location: Right Arm)   Pulse 85   Temp 97.8 F (36.6 C) (Oral)   Resp 17   Ht 5\' 8"  (1.727 m)   Wt 120.2 kg   LMP 06/21/2022 Comment: recent abortion  SpO2 100%   BMI 40.29 kg/m  Physical Exam Vitals and nursing note reviewed.  Constitutional:      General: She is not in acute distress.    Appearance: She is well-developed.  HENT:     Head: Normocephalic and atraumatic.  Eyes:     Conjunctiva/sclera: Conjunctivae normal.  Cardiovascular:     Rate and Rhythm: Normal rate and regular rhythm.     Heart sounds: No murmur heard. Pulmonary:     Effort: Pulmonary effort is normal. No respiratory distress.     Breath sounds: Normal breath sounds.  Abdominal:     Palpations: Abdomen is soft.     Tenderness: There  is no abdominal tenderness.  Musculoskeletal:        General: No swelling.     Cervical back: Neck supple.  Skin:    General: Skin is warm and dry.     Capillary Refill: Capillary refill takes less than 2 seconds.  Neurological:     Mental Status: She is alert.  Psychiatric:        Mood and Affect: Mood normal.     ED Results / Procedures / Treatments   Labs (all labs ordered are listed, but only abnormal results are displayed) Labs Reviewed  BASIC METABOLIC PANEL - Abnormal; Notable for the following components:      Result Value   Glucose, Bld 119 (*)    All other components within normal limits  CBC  HCG, SERUM, QUALITATIVE  D-DIMER, QUANTITATIVE  TSH  TROPONIN I (HIGH  SENSITIVITY)  TROPONIN I (HIGH SENSITIVITY)    EKG EKG Interpretation Date/Time:  Friday September 22 2022 14:02:10 EDT Ventricular Rate:  77 PR Interval:  140 QRS Duration:  78 QT Interval:  348 QTC Calculation: 393 R Axis:   76  Text Interpretation: Normal sinus rhythm with sinus arrhythmia Normal ECG No significant change since last tracing Confirmed by Elayne Snare ([id number]) on 09/22/2022 5:01:58 PM  Radiology DG Chest 2 View  Result Date: 09/22/2022 CLINICAL DATA:  Shortness of breath with dizziness and weakness. EXAM: CHEST - 2 VIEW COMPARISON:  None Available. FINDINGS: The heart size and mediastinal contours are normal. The lungs are clear. There is no pleural effusion or pneumothorax. No acute osseous findings are identified. IMPRESSION: No active cardiopulmonary process. Electronically Signed   By: Carey Bullocks M.D.   On: 09/22/2022 14:59    Procedures Procedures   Medications Ordered in ED Medications - No data to display  ED Course/ Medical Decision Making/ A&P                           Medical Decision Making Amount and/or Complexity of Data Reviewed Labs: ordered. Radiology: ordered.   This patient presents to the ED for concern of fatigue, confusion.  Differential diagnosis includes hypothyroidism, electrolyte abnormality, infection, sepsis, pneumonia, arrhythmia   Lab Tests:  I Ordered, and personally interpreted labs.  The pertinent results include: CBC unremarkable, BMP unremarkable, hCG negative, TSH normal, troponin negative, D-dimer negative   Imaging Studies ordered:  I ordered imaging studies including chest x-ray I independently visualized and interpreted imaging which showed no acute cardiopulmonary process I agree with the radiologist interpretation   Problem List / ED Course:  Patient presents to the emergency department with multiple complaints.  Patient is endorsing fatigue, confusion, dizziness.  Patient was seen emergency department 2  days ago for similar concerns.  Given unclear etiology of patient's symptoms or if he is organic or not, will will initiate lab workup as well as x-ray imaging of chest and EKG. Chest x-ray and EKG were unremarkable.  Labs are also all unremarkable without any abnormalities noted.  Added on TSH which was also normal and a D-dimer which was negative.  With all labs largely normal limits and patient remained hemodynamically stable, do not believe there is an emergent condition at this time that requires further evaluation or treatment.  Unsure exact etiology of patient's symptoms at this time.  Encourage patient that she should follow-up with a family medicine provider for further evaluation of symptoms.  Also discussed return precautions.  Patient is agreeable to treatment  plan verbalized understanding all return precautions.  All questions answered prior to patient discharge.   Social Determinants of Health:  No primary care provider  Final Clinical Impression(s) / ED Diagnoses Final diagnoses:  Other fatigue    Rx / DC Orders ED Discharge Orders     None         Smitty Knudsen, PA-C 09/22/22 1939    Rexford Maus, DO 09/22/22 2228

## 2022-09-22 NOTE — ED Triage Notes (Signed)
Pt arrived POV w/ c/o dizziness, fatigue, low heart rate, confusion. Pt says she feels like her heart rate is low. She did not use anything to measure it. Pt also c/o shob. Symptoms have been ongoing x 1.5 weeks. Bilateral pressure in ears. Pt in NAD at this time.

## 2022-11-10 ENCOUNTER — Emergency Department (HOSPITAL_COMMUNITY)
Admission: EM | Admit: 2022-11-10 | Discharge: 2022-11-10 | Disposition: A | Discharge status: Home / Self Care | Payer: Medicare Other | Attending: Emergency Medicine | Admitting: Emergency Medicine

## 2022-11-10 ENCOUNTER — Other Ambulatory Visit: Payer: Self-pay

## 2022-11-10 ENCOUNTER — Encounter (HOSPITAL_COMMUNITY): Payer: Self-pay | Admitting: *Deleted

## 2022-11-10 DIAGNOSIS — R200 Anesthesia of skin: Secondary | ICD-10-CM | POA: Diagnosis present

## 2022-11-10 DIAGNOSIS — R202 Paresthesia of skin: Secondary | ICD-10-CM | POA: Diagnosis not present

## 2022-11-10 LAB — I-STAT CHEM 8, ED
BUN: 5 mg/dL — ABNORMAL LOW (ref 6–20)
Calcium, Ion: 1.15 mmol/L (ref 1.15–1.40)
Chloride: 104 mmol/L (ref 98–111)
Creatinine, Ser: 0.7 mg/dL (ref 0.44–1.00)
Glucose, Bld: 96 mg/dL (ref 70–99)
HCT: 39 % (ref 36.0–46.0)
Hemoglobin: 13.3 g/dL (ref 12.0–15.0)
Potassium: 3.9 mmol/L (ref 3.5–5.1)
Sodium: 139 mmol/L (ref 135–145)
TCO2: 22 mmol/L (ref 22–32)

## 2022-11-10 LAB — PREGNANCY, URINE: Preg Test, Ur: NEGATIVE

## 2022-11-10 NOTE — ED Provider Notes (Signed)
[facility] EMERGENCY DEPARTMENT AT [facility] Provider Note   CSN: [id number] Arrival date & time: 11/10/22  4098     History  Chief Complaint  Patient presents with   Arm Pain    Carly Brooks is a 27 y.o. female.  HPI 27 year old female G3, P2 A1 presents today complaining of left upper extremity numbness.  States this has been coming and going for 2 months.  It has not worsened over the past 24 hours.  She has some pain in her left forearm.  She states she also has some symptoms in her right arm intermittently.  She does not complain of any weakness.  She does states she has some intermittent dyspnea.  Patient is complaining of some left-sided neck pain that has been present for 2 months.  She denies any injury, redness, swelling, sore throat, fever, chills.  She points to the left side of the neck when asked where it is.  She reports normal appetite and weight.  She states that she had an abortion 1 month ago and is not currently using any birth control and has not been sexually active since that time.     Home Medications Prior to Admission medications   Medication Sig Start Date End Date Taking? Authorizing Provider  acetaminophen (TYLENOL) 500 MG tablet Take 2 tablets (1,000 mg total) by mouth every 6 (six) hours as needed. Patient not taking: Reported on 05/03/2021 04/20/21   Allayne Stack, DO  ferrous sulfate 325 (65 FE) MG tablet Take 1 tablet (325 mg total) by mouth every other day. 04/21/21   Allayne Stack, DO  ibuprofen (ADVIL) 600 MG tablet Take 1 tablet (600 mg total) by mouth every 6 (six) hours as needed. 04/20/21   Allayne Stack, DO  levonorgestrel (LILETTA, 52 MG,) 20.1 MCG/DAY IUD 1 each by Intrauterine route once.    [provider]  oxyCODONE (OXY IR/ROXICODONE) 5 MG immediate release tablet Take 1 tablet (5 mg total) by mouth every 6 (six) hours as needed for severe pain. 04/20/21   Allayne Stack, DO  Prenatal Vit-Fe  Fumarate-FA (PRENATAL VITAMINS PO) Take by mouth.    [provider]  Prenatal Vit-Fe Phos-FA-Omega (VITAFOL GUMMIES) 3.33-0.333-34.8 MG CHEW Chew 3 tablets by mouth daily. 08/02/22   Hermina Staggers, MD  senna-docusate (SENOKOT-S) 8.6-50 MG tablet Take 2 tablets by mouth daily as needed for mild constipation. 04/20/21   Allayne Stack, DO  sertraline (ZOLOFT) 50 MG tablet Take 1 tablet (50 mg total) by mouth daily. 04/11/21   Milas Hock, MD      Allergies    Patient has no known allergies.    Review of Systems   Review of Systems  Physical Exam Updated Vital Signs BP (!) 125/94   Pulse 63   Temp 98.3 F (36.8 C) (Oral)   Resp 18   Ht 1.727 m (5\' 8" )   Wt 120.2 kg   LMP 06/21/2022   SpO2 100%   BMI 40.29 kg/m  Physical Exam Vitals and nursing note reviewed.  Constitutional:      General: She is not in acute distress.    Appearance: She is obese.  HENT:     Head: Normocephalic.     Right Ear: External ear normal.     Left Ear: External ear normal.     Nose: Nose normal.     Mouth/Throat:     Pharynx: Oropharynx is clear.  Eyes:  Pupils: Pupils are equal, round, and reactive to light.  Cardiovascular:     Rate and Rhythm: Normal rate.     Pulses: Normal pulses.  Pulmonary:     Effort: Pulmonary effort is normal.     Breath sounds: Normal breath sounds.  Abdominal:     General: Bowel sounds are normal.     Palpations: Abdomen is soft.  Musculoskeletal:        General: Normal range of motion.     Cervical back: Normal range of motion.  Skin:    General: Skin is warm and dry.     Capillary Refill: Capillary refill takes less than 2 seconds.  Neurological:     General: No focal deficit present.     Mental Status: She is alert.  Psychiatric:        Mood and Affect: Mood normal.     ED Results / Procedures / Treatments   Labs (all labs ordered are listed, but only abnormal results are displayed) Labs Reviewed  I-STAT CHEM 8, ED - Abnormal;  Notable for the following components:      Result Value   BUN 5 (*)    All other components within normal limits  PREGNANCY, URINE    EKG None  Radiology No results found.  Procedures Procedures    Medications Ordered in ED Medications - No data to display  ED Course/ Medical Decision Making/ A&P                                 Medical Decision Making Amount and/or Complexity of Data Reviewed Labs: ordered.  Diagnosis includes but is not limited to cervical spine abnormality, disc disease, metabolic abnormality, behavioral health etiologies such as stress and anxiety Physical exam does not show any evidence of acute spinal cord etiology with normal neurological exam I-STAT obtained and normal sodium potassium glucose and renal function Patient appears stable for discharge        Final Clinical Impression(s) / ED Diagnoses Final diagnoses:  Paresthesia    Rx / DC Orders ED Discharge Orders     None         Margarita Grizzle, MD 11/10/22 1205

## 2022-11-10 NOTE — ED Triage Notes (Signed)
C/o numbness off and on in her left arm for several weeks now c/o right arm numbness c/o neck pain c/o sob and dizziness

## 2023-01-22 ENCOUNTER — Ambulatory Visit: Payer: Medicare Other | Admitting: Student

## 2023-01-22 ENCOUNTER — Encounter: Payer: Self-pay | Admitting: Student

## 2023-01-22 VITALS — BP 115/69 | HR 69 | Temp 98.4°F | Ht 68.0 in | Wt 290.0 lb

## 2023-01-22 DIAGNOSIS — Z9851 Tubal ligation status: Secondary | ICD-10-CM | POA: Diagnosis not present

## 2023-01-22 DIAGNOSIS — Z3202 Encounter for pregnancy test, result negative: Secondary | ICD-10-CM | POA: Diagnosis not present

## 2023-01-22 DIAGNOSIS — Z23 Encounter for immunization: Secondary | ICD-10-CM | POA: Diagnosis present

## 2023-01-22 DIAGNOSIS — Z3009 Encounter for other general counseling and advice on contraception: Secondary | ICD-10-CM | POA: Diagnosis not present

## 2023-01-22 DIAGNOSIS — F32A Depression, unspecified: Secondary | ICD-10-CM

## 2023-01-22 LAB — POCT URINE PREGNANCY: Preg Test, Ur: NEGATIVE

## 2023-01-22 MED ORDER — NORGESTIM-ETH ESTRAD TRIPHASIC 0.18/0.215/0.25 MG-25 MCG PO TABS: 1.0000 | ORAL_TABLET | Freq: Every day | ORAL | 11 refills | Status: DC

## 2023-01-22 NOTE — Progress Notes (Signed)
   New Patient Office Visit  Subjective    Patient ID: Carly Brooks, female    DOB: 10-17-1995  Age: 26 y.o. MRN: [id number]  CC: No chief complaint on file.   HPI Carly Brooks presents to establish care ***  Outpatient Encounter Medications as of 01/22/2023  Medication Sig   acetaminophen (TYLENOL) 500 MG tablet Take 2 tablets (1,000 mg total) by mouth every 6 (six) hours as needed. (Patient not taking: Reported on 05/03/2021)   ferrous sulfate 325 (65 FE) MG tablet Take 1 tablet (325 mg total) by mouth every other day.   ibuprofen (ADVIL) 600 MG tablet Take 1 tablet (600 mg total) by mouth every 6 (six) hours as needed.   levonorgestrel (LILETTA, 52 MG,) 20.1 MCG/DAY IUD 1 each by Intrauterine route once.   oxyCODONE (OXY IR/ROXICODONE) 5 MG immediate release tablet Take 1 tablet (5 mg total) by mouth every 6 (six) hours as needed for severe pain.   Prenatal Vit-Fe Fumarate-FA (PRENATAL VITAMINS PO) Take by mouth.   Prenatal Vit-Fe Phos-FA-Omega (VITAFOL GUMMIES) 3.33-0.333-34.8 MG CHEW Chew 3 tablets by mouth daily.   senna-docusate (SENOKOT-S) 8.6-50 MG tablet Take 2 tablets by mouth daily as needed for mild constipation.   sertraline (ZOLOFT) 50 MG tablet Take 1 tablet (50 mg total) by mouth daily.   No facility-administered encounter medications on file as of 01/22/2023.    Past Medical History:  Diagnosis Date   Anxiety    Cluster B personality disorder (HCC)    Depression    Medical history non-contributory     Past Surgical History:  Procedure Laterality Date   CESAREAN SECTION     CESAREAN SECTION  04/18/2021   CESAREAN SECTION WITH BILATERAL TUBAL LIGATION N/A 04/18/2021   Procedure: CESAREAN SECTION    Family History  Problem Relation Age of Onset   Diabetes Mother     Social History   Socioeconomic History   Marital status: Single    Spouse name: Not on file   Number of children: Not on file   Years of education: Not on file    Highest education level: Not on file  Occupational History   Not on file  Tobacco Use   Smoking status: Never   Smokeless tobacco: Never  Vaping Use   Vaping status: Never Used  Substance and Sexual Activity   Alcohol use: Not Currently    Alcohol/week: 2.0 standard drinks of alcohol    Types: 2 Glasses of wine per week   Drug use: Never   Sexual activity: Yes    Partners: Male  Other Topics Concern   Not on file  Social History Narrative   Not on file   Social Determinants of Health   Financial Resource Strain: Not on file  Food Insecurity: Not on file  Transportation Needs: Not on file  Physical Activity: Not on file  Stress: Not on file  Social Connections: Not on file  Intimate Partner Violence: Not on file    ROS      Objective    LMP 06/21/2022   Physical Exam  {Labs (Optional):23779}    Assessment & Plan:   Problem List Items Addressed This Visit   None   No follow-ups on file.  Patient seen with Dr. Criselda Peaches.   Attilio Zeitler Colbert Coyer, MD

## 2023-01-22 NOTE — Patient Instructions (Signed)
Thank you, Ms.Cranford Mon for allowing [place] to provide your care today. Today we discussed establishing your care with our clinic.  I have ordered the following labs for you:  Lab Orders         POCT Urine Pregnancy      Tests ordered today:  -n/a  Referrals ordered today:   Referral Orders         Ambulatory referral to Integrated Behavioral Health         Ambulatory referral to Psychiatry      I have ordered the following medication/changed the following medications:   Stop the following medications: Medications Discontinued During This Encounter  Medication Reason   levonorgestrel (LILETTA, 52 MG,) 20.1 MCG/DAY IUD      Start the following medications: Meds ordered this encounter  Medications   Norgestimate-Ethinyl Estradiol Triphasic (ORTHO TRI-CYCLEN LO) 0.18/0.215/0.25 MG-25 MCG tab    Sig: Take 1 tablet by mouth daily.    Dispense:  28 tablet    Refill:  11     Follow up: 4 weeks for birth control check and psychiatry/integrated behavioral health referral    Remember:   - I have sent your birth control prescription the [facility] on [address], please follow insert instructions to get started. Your pregnancy test from today was negative.   - You have been referred to talk therapy and additional resources for behavioral health, Marena Chancy from our office will call you to set up those phone sessions.   - You have been referred to psychiatry for medication management of your mood disorder, they will call you to set up your first appointment.   - You will get a call to set up your 1 month follow up appointment with our clinic.   Should you have any questions or concerns please call the internal medicine clinic at [phone].     Aaralynn Shepheard Colbert Coyer, MD PGY-1 Internal Medicine Teaching [facility]

## 2023-01-24 DIAGNOSIS — Z9851 Tubal ligation status: Secondary | ICD-10-CM | POA: Insufficient documentation

## 2023-01-24 NOTE — Assessment & Plan Note (Signed)
Patient with history of tubal ligation 2023. Endorses having heavy periods, interested in OCPs to help manage menstrual symptoms. History of anemia following C-sections. No history of migraines or tobacco use. Denies overt bleeding today. Will start on OCP and follow up in 1 a month to help determine if helpful. No CBC today given patient is asymptomatic, CBC July 2024 Hgb 13.  - Start Ortho Tri-Cyclen tablet daily, follow package insert - Follow up in 1 month

## 2023-01-24 NOTE — Assessment & Plan Note (Signed)
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 Was taking sertraline/Zoloft but stopped when she was pregnant with her second child. She reports she has been diagnosed with anxiety, depression, and bipolar disorder in the past. Would like to start treatment, but with history of mania will not re-start SSRI at this time. Will refer to integrated behavioral health as well as psychiatry to help with management.  - Placed referral for IBH, Bianca to call patient  - Placed referral for psychiatry, will follow up at next visit

## 2023-01-25 NOTE — Addendum Note (Signed)
Addended by: Debe Coder B on: 01/25/2023 10:55 AM   Modules accepted: Level of Service

## 2023-01-25 NOTE — Progress Notes (Signed)
 Internal Medicine Clinic Attending  I was physically present during the key portions of the resident provided service and participated in the medical decision making of patient's management care. I reviewed pertinent patient test results.  The assessment, diagnosis, and plan were formulated together and I agree with the documentation in the resident's note.  Inez Catalina, MD

## 2023-02-19 ENCOUNTER — Encounter: Payer: Medicare Other | Admitting: Internal Medicine

## 2023-02-19 NOTE — Progress Notes (Unsigned)
CC: ***  HPI:  Ms.Carly Brooks is a 27 y.o. female living with a history stated below and presents today for ***. Please see problem based assessment and plan for additional details.  Past Medical History:  Diagnosis Date   Anxiety    Cluster B personality disorder (HCC)    Depression    Medical history non-contributory     Current Outpatient Medications on File Prior to Visit  Medication Sig Dispense Refill   acetaminophen (TYLENOL) 500 MG tablet Take 2 tablets (1,000 mg total) by mouth every 6 (six) hours as needed. (Patient not taking: Reported on 05/03/2021) 30 tablet 0   ferrous sulfate 325 (65 FE) MG tablet Take 1 tablet (325 mg total) by mouth every other day. 30 tablet 0   ibuprofen (ADVIL) 600 MG tablet Take 1 tablet (600 mg total) by mouth every 6 (six) hours as needed. 30 tablet 0   Norgestimate-Ethinyl Estradiol Triphasic (ORTHO TRI-CYCLEN LO) 0.18/0.215/0.25 MG-25 MCG tab Take 1 tablet by mouth daily. 28 tablet 11   Prenatal Vit-Fe Fumarate-FA (PRENATAL VITAMINS PO) Take by mouth.     Prenatal Vit-Fe Phos-FA-Omega (VITAFOL GUMMIES) 3.33-0.333-34.8 MG CHEW Chew 3 tablets by mouth daily. 90 tablet 11   senna-docusate (SENOKOT-S) 8.6-50 MG tablet Take 2 tablets by mouth daily as needed for mild constipation. 20 tablet 0   No current facility-administered medications on file prior to visit.    Family History  Problem Relation Age of Onset   Diabetes Mother     Social History   Socioeconomic History   Marital status: Single    Spouse name: Not on file   Number of children: Not on file   Years of education: Not on file   Highest education level: Not on file  Occupational History   Not on file  Tobacco Use   Smoking status: Never   Smokeless tobacco: Never  Vaping Use   Vaping status: Never Used  Substance and Sexual Activity   Alcohol use: Not Currently    Alcohol/week: 2.0 standard drinks of alcohol    Types: 2 Glasses of wine per week   Drug  use: Never   Sexual activity: Yes    Partners: Male  Other Topics Concern   Not on file  Social History Narrative   Not on file   Social Determinants of Health   Financial Resource Strain: Not on file  Food Insecurity: Not on file  Transportation Needs: Not on file  Physical Activity: Not on file  Stress: Not on file  Social Connections: Not on file  Intimate Partner Violence: Not on file    Review of Systems: ROS negative except for what is noted on the assessment and plan.  There were no vitals filed for this visit.  Physical Exam: Constitutional: well-appearing *** sitting in ***, in no acute distress HENT: normocephalic atraumatic, mucous membranes moist Eyes: conjunctiva non-erythematous Cardiovascular: regular rate and rhythm, no m/r/g Pulmonary/Chest: normal work of breathing on room air, lungs clear to auscultation bilaterally Abdominal: soft, non-tender, non-distended MSK: normal bulk and tone Neurological: alert & oriented x 3, no focal deficit Skin: warm and dry Psych: normal mood and behavior  Assessment & Plan:   Heavy menstrual bleeding - hx of bilateral tubal ligation  - started Tri-Cyclen tablet last month   Patient {GC/GE:3044014::"discussed with","seen with"} Dr. {UJWJX:[id number]::"FAOZHYQM","V. Hoffman","Mullen","Narendra","Vincent","Guilloud","Lau","Machen"}  No problem-specific Assessment & Plan notes found for this encounter.   Elza Rafter, D.O. [facility] Internal [facility], PGY-3 Phone: [phone] Date 02/19/2023  Time 7:16 AM

## 2023-03-07 ENCOUNTER — Ambulatory Visit: Payer: Medicare Other | Admitting: Student

## 2023-03-07 VITALS — BP 122/74 | HR 78 | Temp 98.1°F | Ht 68.0 in | Wt 278.5 lb

## 2023-03-07 DIAGNOSIS — Z0000 Encounter for general adult medical examination without abnormal findings: Secondary | ICD-10-CM | POA: Insufficient documentation

## 2023-03-07 DIAGNOSIS — N920 Excessive and frequent menstruation with regular cycle: Secondary | ICD-10-CM

## 2023-03-07 DIAGNOSIS — Z131 Encounter for screening for diabetes mellitus: Secondary | ICD-10-CM

## 2023-03-07 DIAGNOSIS — Z6841 Body Mass Index (BMI) 40.0 and over, adult: Secondary | ICD-10-CM

## 2023-03-07 DIAGNOSIS — Z8659 Personal history of other mental and behavioral disorders: Secondary | ICD-10-CM | POA: Diagnosis not present

## 2023-03-07 DIAGNOSIS — Z9851 Tubal ligation status: Secondary | ICD-10-CM

## 2023-03-07 LAB — POCT GLYCOSYLATED HEMOGLOBIN (HGB A1C): Hemoglobin A1C: 5.1 % (ref 4.0–5.6)

## 2023-03-07 LAB — GLUCOSE, CAPILLARY: Glucose-Capillary: 108 mg/dL — ABNORMAL HIGH (ref 70–99)

## 2023-03-07 NOTE — Progress Notes (Signed)
CC: 1 month follow-up  HPI:  Ms.Carly Brooks is a 27 y.o. female living with a history stated below and presents today for 1 month follow-up for menorrhagia and bipolar disorder. Please see problem based assessment and plan for additional details.  Past Medical History:  Diagnosis Date   Anxiety    Cluster B personality disorder (HCC)    Depression    Medical history non-contributory     Current Outpatient Medications on File Prior to Visit  Medication Sig Dispense Refill   acetaminophen (TYLENOL) 500 MG tablet Take 2 tablets (1,000 mg total) by mouth every 6 (six) hours as needed. (Patient not taking: Reported on 05/03/2021) 30 tablet 0   ferrous sulfate 325 (65 FE) MG tablet Take 1 tablet (325 mg total) by mouth every other day. 30 tablet 0   ibuprofen (ADVIL) 600 MG tablet Take 1 tablet (600 mg total) by mouth every 6 (six) hours as needed. 30 tablet 0   Norgestimate-Ethinyl Estradiol Triphasic (ORTHO TRI-CYCLEN LO) 0.18/0.215/0.25 MG-25 MCG tab Take 1 tablet by mouth daily. 28 tablet 11   Prenatal Vit-Fe Fumarate-FA (PRENATAL VITAMINS PO) Take by mouth.     Prenatal Vit-Fe Phos-FA-Omega (VITAFOL GUMMIES) 3.33-0.333-34.8 MG CHEW Chew 3 tablets by mouth daily. 90 tablet 11   senna-docusate (SENOKOT-S) 8.6-50 MG tablet Take 2 tablets by mouth daily as needed for mild constipation. 20 tablet 0   No current facility-administered medications on file prior to visit.    Family History  Problem Relation Age of Onset   Diabetes Mother     Social History   Socioeconomic History   Marital status: Single    Spouse name: Not on file   Number of children: Not on file   Years of education: Not on file   Highest education level: Not on file  Occupational History   Not on file  Tobacco Use   Smoking status: Never   Smokeless tobacco: Never  Vaping Use   Vaping status: Never Used  Substance and Sexual Activity   Alcohol use: Not Currently    Alcohol/week: 2.0 standard  drinks of alcohol    Types: 2 Glasses of wine per week   Drug use: Never   Sexual activity: Yes    Partners: Male  Other Topics Concern   Not on file  Social History Narrative   Not on file   Social Drivers of Health   Financial Resource Strain: Not on file  Food Insecurity: Not on file  Transportation Needs: Not on file  Physical Activity: Not on file  Stress: Not on file  Social Connections: Not on file  Intimate Partner Violence: Not on file    Review of Systems: ROS negative except for what is noted on the assessment and plan.  Vitals:   03/07/23 0909  BP: 122/74  Pulse: 78  Temp: 98.1 F (36.7 C)  TempSrc: Oral  Weight: 278 lb 8 oz (126.3 kg)  Height: 5\' 8"  (1.727 m)   Physical Exam: Constitutional: well-appearing female sitting in chair comfortably, in no acute distress HENT: normocephalic atraumatic Cardiovascular: regular rate and rhythm Pulmonary/Chest: normal work of breathing on room air, lungs clear to auscultation bilaterally Neurological: alert & oriented x 3 Psych: Normal mood and affect  Assessment & Plan:   Healthcare maintenance -Screening for diabetes done today with A1c 5.1.   Morbid obesity with BMI of 40.0-44.9, adult (HCC) Body mass index is 42.35 kg/m. Screening for diabetes done today with A1c normal at 5.1. Continue  working on lifestyle modifications including healthy eating habits and physical activity.   Hx of bipolar disorder Was previously seen by psychiatry in [place] per patient and on Abilify and sertraline in 2022. Noted while on sertraline with manic symptoms. Given history, referral sent to psychiatry and IBH for further evaluation/follow-up. Has appt with Marena Chancy in January. PHQ-9 and GAD-7 both 14 today. Denies SI/HI. Patient states feeling overall well today. 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   Plan -Pending referral to psychiatry  -IBH f/u in January   Menorrhagia Reports history of heavy menstrual bleeding.   Menstrual cycle last about 7 days with 3-4 days of heavy bleeding.  During those heavy bleeding days requires tampon every 1-2 hours.  However her last menstrual cycle was normal without heavy bleeding.  Asymptomatic today.  Has not started OCP and encourage patient if concerns for return of heavy menstrual bleeding to start OCP which patient understands and agrees.   Patient discussed with Dr. Docia Furl, D.O. [facility] Internal Medicine, PGY-2 Phone: [phone] Date 03/07/2023 Time 9:49 AM

## 2023-03-07 NOTE — Assessment & Plan Note (Signed)
Was previously seen by psychiatry in [place] per patient and on Abilify and sertraline in 2022. Noted while on sertraline with manic symptoms. Given history, referral sent to psychiatry and IBH for further evaluation/follow-up. Has appt with Marena Chancy in January. PHQ-9 and GAD-7 both 14 today. Denies SI/HI. Patient states feeling overall well today. 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   Plan -Pending referral to psychiatry  -IBH f/u in January

## 2023-03-07 NOTE — Assessment & Plan Note (Signed)
Reports history of heavy menstrual bleeding.  Menstrual cycle last about 7 days with 3-4 days of heavy bleeding.  During those heavy bleeding days requires tampon every 1-2 hours.  However her last menstrual cycle was normal without heavy bleeding.  Asymptomatic today.  Has not started OCP and encourage patient if concerns for return of heavy menstrual bleeding to start OCP which patient understands and agrees.

## 2023-03-07 NOTE — Assessment & Plan Note (Addendum)
Body mass index is 42.35 kg/m. Screening for diabetes done today with A1c normal at 5.1. Continue working on lifestyle modifications including healthy eating habits and physical activity.

## 2023-03-07 NOTE — Assessment & Plan Note (Addendum)
-  Screening for diabetes done today with A1c 5.1.

## 2023-03-07 NOTE — Patient Instructions (Addendum)
Thank you, Ms.Cranford Mon for allowing [place] to provide your care today. Today we discussed your menstrual bleeding, BPD and overall health.  -Glad you are feeling well today! -The birth control pills are available at your pharmacy to pick up.  -You have an appt with Marena Chancy in January  -Will check on referral to psychiatry about your medications -Will check blood work for diabetes today, I will call with results  I have ordered the following labs for you: Lab Orders         POC Hbg A1C      Follow up: 3-4 months   Should you have any questions or concerns please call the internal medicine clinic at [phone].    Rana Snare, D.O. [facility]

## 2023-03-12 NOTE — Addendum Note (Signed)
Addended by: Carlynn Purl C on: 03/12/2023 03:04 PM   Modules accepted: Level of Service

## 2023-03-12 NOTE — Progress Notes (Signed)
Internal Medicine Clinic Attending  Case discussed with the resident at the time of the visit.  We reviewed the resident's history and exam and pertinent patient test results.  I agree with the assessment, diagnosis, and plan of care documented in the resident's note.  

## 2023-03-29 ENCOUNTER — Emergency Department (HOSPITAL_COMMUNITY)
Admission: EM | Admit: 2023-03-29 | Discharge: 2023-03-29 | Disposition: A | Discharge status: Home / Self Care | Payer: Medicare Other | Attending: Emergency Medicine | Admitting: Emergency Medicine

## 2023-03-29 ENCOUNTER — Other Ambulatory Visit: Payer: Self-pay

## 2023-03-29 ENCOUNTER — Emergency Department (HOSPITAL_COMMUNITY): Payer: Medicare Other

## 2023-03-29 ENCOUNTER — Encounter (HOSPITAL_COMMUNITY): Payer: Self-pay | Admitting: Emergency Medicine

## 2023-03-29 DIAGNOSIS — R109 Unspecified abdominal pain: Secondary | ICD-10-CM | POA: Insufficient documentation

## 2023-03-29 LAB — COMPREHENSIVE METABOLIC PANEL
ALT: 34 U/L (ref 0–44)
AST: 32 U/L (ref 15–41)
Albumin: 3.6 g/dL (ref 3.5–5.0)
Alkaline Phosphatase: 114 U/L (ref 38–126)
Anion gap: 9 (ref 5–15)
BUN: 5 mg/dL — ABNORMAL LOW (ref 6–20)
CO2: 22 mmol/L (ref 22–32)
Calcium: 9.1 mg/dL (ref 8.9–10.3)
Chloride: 105 mmol/L (ref 98–111)
Creatinine, Ser: 0.76 mg/dL (ref 0.44–1.00)
GFR, Estimated: >60 mL/min (ref >60)
Glucose, Bld: 101 mg/dL — ABNORMAL HIGH (ref 70–99)
Potassium: 3.6 mmol/L (ref 3.5–5.1)
Sodium: 136 mmol/L (ref 135–145)
Total Bilirubin: 0.4 mg/dL (ref 0.0–1.2)
Total Protein: 7 g/dL (ref 6.5–8.1)

## 2023-03-29 LAB — CBC WITH DIFFERENTIAL/PLATELET
Abs Immature Granulocytes: 0.01 10*3/uL (ref 0.00–0.07)
Basophils Absolute: 0 10*3/uL (ref 0.0–0.1)
Basophils Relative: 0 %
Eosinophils Absolute: 0 10*3/uL (ref 0.0–0.5)
Eosinophils Relative: 1 %
HCT: 39.9 % (ref 36.0–46.0)
Hemoglobin: 12.5 g/dL (ref 12.0–15.0)
Immature Granulocytes: 0 %
Lymphocytes Relative: 39 %
Lymphs Abs: 2.1 10*3/uL (ref 0.7–4.0)
MCH: 26.1 pg (ref 26.0–34.0)
MCHC: 31.3 g/dL (ref 30.0–36.0)
MCV: 83.3 fL (ref 80.0–100.0)
Monocytes Absolute: 0.5 10*3/uL (ref 0.1–1.0)
Monocytes Relative: 10 %
Neutro Abs: 2.7 10*3/uL (ref 1.7–7.7)
Neutrophils Relative %: 50 %
Platelets: 266 10*3/uL (ref 150–400)
RBC: 4.79 MIL/uL (ref 3.87–5.11)
RDW: 14.3 % (ref 11.5–15.5)
WBC: 5.4 10*3/uL (ref 4.0–10.5)
nRBC: 0 % (ref 0.0–0.2)

## 2023-03-29 LAB — LIPASE, BLOOD: Lipase: 43 U/L (ref 11–51)

## 2023-03-29 LAB — URINALYSIS, ROUTINE W REFLEX MICROSCOPIC
Bilirubin Urine: NEGATIVE
Glucose, UA: NEGATIVE mg/dL
Hgb urine dipstick: NEGATIVE
Ketones, ur: NEGATIVE mg/dL
Leukocytes,Ua: NEGATIVE
Nitrite: NEGATIVE
Protein, ur: NEGATIVE mg/dL
Specific Gravity, Urine: 1.014 (ref 1.005–1.030)
pH: 7 (ref 5.0–8.0)

## 2023-03-29 LAB — PREGNANCY, URINE: Preg Test, Ur: NEGATIVE

## 2023-03-29 NOTE — ED Triage Notes (Signed)
 Pt with left flank pain, urinary frequency, "heavy bladder" and fever/chills since 03/21/23.  Has had one episode of vomiting and one episode of diarrhea.  LMP 12/17

## 2023-03-29 NOTE — ED Provider Notes (Signed)
 [place] [facility] Provider Note   CSN: [id number] Arrival date & time: 03/29/23  1538     History Chief Complaint  Patient presents with   Flank Pain    Carly Brooks is a 28 y.o. female with history of kidney stones who presents to the emergency department today for further evaluation of left-sided low back pain this been present for 4 days.  She reports associated fever and chills at home subjectively.  She denies any nausea, vomiting, diarrhea.  She also endorses some urinary urgency but denies any dysuria or foul odor.   Flank Pain       Home Medications Prior to Admission medications   Medication Sig Start Date End Date Taking? Authorizing Provider  acetaminophen  (TYLENOL ) 500 MG tablet Take 2 tablets (1,000 mg total) by mouth every 6 (six) hours as needed. Patient not taking: Reported on 05/03/2021 04/20/21   Jarrett Lucie SAILOR, DO  ferrous sulfate  325 (65 FE) MG tablet Take 1 tablet (325 mg total) by mouth every other day. 04/21/21   Jarrett Lucie SAILOR, DO  ibuprofen  (ADVIL ) 600 MG tablet Take 1 tablet (600 mg total) by mouth every 6 (six) hours as needed. 04/20/21   Jarrett Lucie SAILOR, DO  Norgestimate-Ethinyl Estradiol Triphasic (ORTHO TRI-CYCLEN LO) 0.18/0.215/0.25 MG-25 MCG tab Take 1 tablet by mouth daily. 01/22/23 01/22/24  Arellano Zameza, Priscila, MD  Prenatal Vit-Fe Fumarate-FA (PRENATAL VITAMINS PO) Take by mouth.    [provider]  Prenatal Vit-Fe Phos-FA-Omega (VITAFOL  GUMMIES) 3.33-0.333-34.8 MG CHEW Chew 3 tablets by mouth daily. 08/02/22   Ervin, Michael L, MD  senna-docusate (SENOKOT-S) 8.6-50 MG tablet Take 2 tablets by mouth daily as needed for mild constipation. 04/20/21   Jarrett Lucie SAILOR, DO      Allergies    Patient has no known allergies.    Review of Systems   Review of Systems  Genitourinary:  Positive for flank pain.  All other systems reviewed and are negative.   Physical Exam Updated Vital  Signs BP (!) 155/89 (BP Location: Right Arm)   Pulse 74   Temp 98.3 F (36.8 C)   Resp 16   LMP 06/21/2022   SpO2 98%  Physical Exam Vitals and nursing note reviewed.  Constitutional:      General: She is not in acute distress.    Appearance: Normal appearance.  HENT:     Head: Normocephalic and atraumatic.  Eyes:     General:        Right eye: No discharge.        Left eye: No discharge.  Cardiovascular:     Comments: Regular rate and rhythm.  S1/S2 are distinct without any evidence of murmur, rubs, or gallops.  Radial pulses are 2+ bilaterally.  Dorsalis pedis pulses are 2+ bilaterally.  No evidence of pedal edema. Pulmonary:     Comments: Clear to auscultation bilaterally.  Normal effort.  No respiratory distress.  No evidence of wheezes, rales, or rhonchi heard throughout. Abdominal:     General: Abdomen is flat. Bowel sounds are normal. There is no distension.     Tenderness: There is no abdominal tenderness. There is left CVA tenderness. There is no right CVA tenderness, guarding or rebound.  Musculoskeletal:        General: Normal range of motion.     Cervical back: Neck supple.  Skin:    General: Skin is warm and dry.     Findings: No rash.  Neurological:  General: No focal deficit present.     Mental Status: She is alert.  Psychiatric:        Mood and Affect: Mood normal.        Behavior: Behavior normal.     ED Results / Procedures / Treatments   Labs (all labs ordered are listed, but only abnormal results are displayed) Labs Reviewed  COMPREHENSIVE METABOLIC PANEL - Abnormal; Notable for the following components:      Result Value   Glucose, Bld 101 (*)    BUN 5 (*)    All other components within normal limits  CBC WITH DIFFERENTIAL/PLATELET  LIPASE, BLOOD  URINALYSIS, ROUTINE W REFLEX MICROSCOPIC  PREGNANCY, URINE    EKG None  Radiology CT Renal Stone Study Result Date: 03/29/2023 CLINICAL DATA:  Abdominal and flank pain. EXAM: CT ABDOMEN AND  PELVIS WITHOUT CONTRAST TECHNIQUE: Multidetector CT imaging of the abdomen and pelvis was performed following the standard protocol without IV contrast. RADIATION DOSE REDUCTION: This exam was performed according to the departmental dose-optimization program which includes automated exposure control, adjustment of the mA and/or kV according to patient size and/or use of iterative reconstruction technique. COMPARISON:  None Available. FINDINGS: Lower chest: No acute abnormality. Hepatobiliary: No focal liver abnormality is seen. No gallstones, gallbladder wall thickening, or biliary dilatation. Pancreas: Unremarkable. No pancreatic ductal dilatation or surrounding inflammatory changes. Spleen: Normal in size without focal abnormality. Adrenals/Urinary Tract: Adrenal glands are unremarkable. Kidneys are normal, without renal calculi, focal lesion, or hydronephrosis. Bladder is unremarkable. Stomach/Bowel: Stomach is within normal limits. Appendix appears normal. No evidence of bowel wall thickening, distention, or inflammatory changes. Vascular/Lymphatic: No significant vascular findings are present. No enlarged abdominal or pelvic lymph nodes. Reproductive: Uterus and bilateral adnexa are unremarkable. Other: No abdominal wall hernia or abnormality. There is trace free fluid in the pelvis. Musculoskeletal: There is sclerosis of the bilateral sacroiliac joints. No acute fractures are seen. IMPRESSION: 1. Trace free fluid in the pelvis, likely physiologic. 2. Sclerosis of the bilateral sacroiliac joints, nonspecific but can be seen in the setting of sacroiliitis. Electronically Signed   By: Greig Pique M.D.   On: 03/29/2023 18:18    Procedures Procedures    Medications Ordered in ED Medications - No data to display  ED Course/ Medical Decision Making/ A&P Clinical Course as of 03/29/23 1835  Thu Mar 29, 2023  1829 CBC with Differential Normal.  [CF]  1829 Urinalysis, Routine w reflex microscopic  -Urine, Clean Catch Normal. [CF]  1829 Pregnancy, urine Normal. [CF]  1829 Lipase, blood Normal. [CF]  1830 CT Renal Stone Study I personally ordered and interpreted the study and do not see any evidence of kidney stone or evidence of pyelonephritis.  I do agree with the radiologist interpretation. [CF]    Clinical Course User Index [CF] Theotis Cameron HERO, PA-C   {   Click here for ABCD2, HEART and other calculators  Medical Decision Making Hailie Searight is a 28 y.o. female patient who presents to the emergency department today for further evaluation of nonradiating left low back pain.  Differential diagnosis does include but not limited to ureterolithiasis, referred pain from cystitis, pyelonephritis, musculoskeletal spasm.  Patient's vital signs are normal.  She does not have a fever here.  All of her labs are reassuring.  There is no signs of UTI and thus making cystitis and/or pav nephritis less likely at this time.  CT renal stone study did not reveal any signs of kidney stone.  This could be that she recently passed a kidney stone or is having some musculoskeletal pain.  Nonetheless, patient is safe for discharge.  Strict return precaution were discussed.     Amount and/or Complexity of Data Reviewed Labs:  Decision-making details documented in ED Course. Radiology:  Decision-making details documented in ED Course.    Final Clinical Impression(s) / ED Diagnoses Final diagnoses:  Left flank pain    Rx / DC Orders ED Discharge Orders     None         Theotis Cameron HERO, PA-C 03/29/23 1835    Bari Roxie HERO, DO 03/29/23 2314

## 2023-03-29 NOTE — Discharge Instructions (Signed)
 There is no evidence of kidney stone or infection today.  This is great news.  This could be either that you recently passed a kidney stone and this is just residual pain or you have some muscular pain in your left lower back area.  I would follow-up with your primary care doctor if you have one.  You may return to the emergency department for any worsening symptoms.

## 2023-03-29 NOTE — ED Provider Triage Note (Signed)
 Emergency Medicine Provider Triage Evaluation Note  Carly Brooks , a 28 y.o. female  was evaluated in triage.  Pt complains of flank pain.  Endorses history of kidney stone.  States this feels similar..  Review of Systems  Positive: As above Negative: As above  Physical Exam  BP (!) 155/89 (BP Location: Right Arm)   Pulse 74   Temp 98.3 F (36.8 C)   Resp 16   LMP 06/21/2022   SpO2 98%  Gen:   Awake, no distress   Resp:  Normal effort  MSK:   Moves extremities without difficulty  Other:    Medical Decision Making  Medically screening exam initiated at 4:13 PM.  Appropriate orders placed.  Carly Brooks was informed that the remainder of the evaluation will be completed by another provider, this initial triage assessment does not replace that evaluation, and the importance of remaining in the ED until their evaluation is complete.     Hildegard Loge, PA-C 03/29/23 [phone]

## 2023-04-05 ENCOUNTER — Ambulatory Visit (INDEPENDENT_AMBULATORY_CARE_PROVIDER_SITE_OTHER): Payer: Medicare Other | Admitting: Licensed Clinical Social Worker

## 2023-04-05 DIAGNOSIS — Z8659 Personal history of other mental and behavioral disorders: Secondary | ICD-10-CM | POA: Diagnosis not present

## 2023-04-05 DIAGNOSIS — Z0000 Encounter for general adult medical examination without abnormal findings: Secondary | ICD-10-CM

## 2023-04-05 NOTE — BH Specialist Note (Signed)
Integrated Behavioral Health Initial In-Person Visit  MRN: [id number] Name: Carly Brooks  Number of Integrated Behavioral Health Clinician visits: 1- Initial Visit  Session Start time: 1530    Session End time: 1600  Total time in minutes: 30   Types of Service: Telephone visit and Introduction only  Interpretor:No. Interpretor Name and Language: N/A   Warm Hand Off Completed.        Subjective: Carly Brooks is a 28 y.o. female  Patient was referred by PCP for Behavioral health. Patient reports the following symptoms/concerns: The Licensed Clinical Engineer, building services (LCSW-A), acting as a Visual merchandiser [facility]), initiated a session with patient. The [facility] introduced themselves, explained her role, and provided contact information to the patient. Confidentiality and mandated reporting were discussed, and the patient denied any suicidal ideations or intent to harm others. The Integrated Behavioral Health (IBH) approach was reviewed, and a PHQ-9 assessment was completed. Patient referred to [facility] outpatient Psychiatry. [facility] assisted patient with contacting agency for an appointment. Patient will My chart [facility] when appointment is established. Patient denied SI.     Patient and/or Family's Strengths/Protective Factors: Social connections  Goals Addressed: Patient will: Reduce symptoms of: mood instability   Standardized Assessments completed: PHQ-SADS    03/07/2023    4:34 PM 03/07/2023    4:33 PM 01/22/2023    3:41 PM  PHQ-SADS Last 3 Score only  Total GAD-7 Score 14  14  PHQ Adolescent Score  12 14     Patient and/or Family Response: Patient agreed to work with [facility] until established with Psychiatry     Patient may benefit from Psychiatry and medication management.  Plan: Follow up with behavioral health clinician on : 01/30 at 3:30 pm   Christen Butter, MSW, LCSW-A She/Her Behavioral Health Clinician [facility]  Internal Medicine  Center Direct Dial:[phone]  Fax [phone] Main Office Phone: [phone] [address] [place] [place], [place] [address] Website: [facility] Internal Medicine [facility]  [place], [place]  [place]

## 2023-04-05 NOTE — BH Specialist Note (Signed)
Patient no-showed today's appointment; appointment was for Telephone visit at 10:15am  Behavioral Health Clinician [facility] from here on out)  attempted patient  via Telephone number [phone]    [facility] contacted patient from telephone number [phone]. [facility] left a  VM.   Patient will need to reschedule appointment by calling Internal medicine center [phone].  Christen Butter, MSW, LCSW-A She/Her Behavioral Health Clinician [facility]  Internal Medicine Center Direct [phone]  Fax [phone] Main Office Phone: [phone] [address] [place] [place], [place] [address] Website: [facility] Internal Medicine [facility]  [place], [place]  [place]

## 2023-04-19 ENCOUNTER — Ambulatory Visit: Payer: Medicare Other | Admitting: Licensed Clinical Social Worker

## 2023-04-19 DIAGNOSIS — Z8659 Personal history of other mental and behavioral disorders: Secondary | ICD-10-CM

## 2023-04-19 NOTE — BH Specialist Note (Signed)
Integrated Behavioral Health via Telemedicine Visit  04/19/2023 Carly Brooks [id number]  Number of Integrated Behavioral Health Clinician visits: 2- Second Visit  Session Start time: 1530   Session End time: 1540  Total time in minutes: 10   Referring Provider: PCP Patient/Family location: Home [facility] Provider location: Office All persons participating in visit: [facility] and Patient Types of Service: Telephone visit  I connected with Carly Brooks via  Telephone oand verified that I am speaking with the correct person using two identifiers. Discussed confidentiality: Yes   I discussed the limitations of telemedicine and the availability of in person appointments.  Discussed there is a possibility of technology failure and discussed alternative modes of communication if that failure occurs.  I discussed that engaging in this telemedicine visit, they consent to the provision of behavioral healthcare and the services will be billed under their insurance.  Patient and/or legal guardian expressed understanding and consented to Telemedicine visit: Yes   Presenting Concerns: Patient and/or family reports the following symptoms/concerns: [facility] briefly spoke with patient.Patient stated she is good. [facility] advised patient to confirm behavioral health appoint ment [facility]. Patient stated she is schedule for 03/05. Patient advised she is feeling good an doesn't currently need services by [facility]. Patient advised she wold reach out if needed.   I discussed the assessment and treatment plan with the patient and/or parent/guardian. They were provided an opportunity to ask questions and all were answered. They agreed with the plan and demonstrated an understanding of the instructions.   They were advised to call back or seek an in-person evaluation if the symptoms worsen or if the condition fails to improve as anticipated.  Christen Butter, MSW, LCSW-A She/Her Behavioral Health Clinician [facility]  Internal Medicine Center Direct [phone]  Fax [phone] Main Office Phone: [phone] [address] [place] [place], [place] [address] Website: [facility] Internal Medicine [facility]  [place], [place]  

## 2023-05-23 ENCOUNTER — Ambulatory Visit: Payer: Medicare Other

## 2023-05-23 VITALS — Ht 68.0 in | Wt 267.0 lb

## 2023-05-23 DIAGNOSIS — Z0000 Encounter for general adult medical examination without abnormal findings: Secondary | ICD-10-CM | POA: Diagnosis not present

## 2023-05-23 NOTE — Patient Instructions (Signed)
 Carly Brooks , Thank you for taking time to come for your Medicare Wellness Visit. I appreciate your ongoing commitment to your health goals. Please review the following plan we discussed and let me know if I can assist you in the future.   Referrals/Orders/Follow-Ups/Clinician Recommendations: Yes; Keep maintaining your health by keeping your appointments with Dr.Arellano-Zameza and any specialists that you may see.  Call us if you need anything.  Have a great year!!!!  This is a list of the screening recommended for you and due dates:  Health Maintenance  Topic Date Due   Pap Smear  03/10/2024   Medicare Annual Wellness Visit  05/22/2024   DTaP/Tdap/Td vaccine (2 - Td or Tdap) 03/11/2031   Flu Shot  Completed   COVID-19 Vaccine  Completed   Hepatitis C Screening  Completed   HIV Screening  Completed   HPV Vaccine  Aged Out    Advanced directives: (Declined) Advance directive discussed with you today. Even though you declined this today, please call our office should you change your mind, and we can give you the proper paperwork for you to fill out.  Next Medicare Annual Wellness Visit scheduled for next year: Yes

## 2023-05-23 NOTE — Progress Notes (Signed)
 Subjective:   Carly Brooks is a 28 y.o. who presents for a Medicare Wellness preventive visit.  Visit Complete: Virtual I connected with  Cranford Mon on 05/23/23 by a video and audio enabled telemedicine application and verified that I am speaking with the correct person using two identifiers.  Patient Location: Home  Provider Location: Office/Clinic  I discussed the limitations of evaluation and management by telemedicine. The patient expressed understanding and agreed to proceed.  Vital Signs: Because this visit was a virtual/telehealth visit, some criteria may be missing or patient reported. Any vitals not documented were not able to be obtained and vitals that have been documented are patient reported.   AWV Questionnaire: No: Patient Medicare AWV questionnaire was not completed prior to this visit.  Cardiac Risk Factors include: sedentary lifestyle;obesity (BMI >30kg/m2)     Objective:    Today's Vitals   05/23/23 1043  Weight: 267 lb (121.1 kg)  Height: 5\' 8"  (1.727 m)  PainSc: 0-No pain   Body mass index is 40.6 kg/m.     05/23/2023   10:45 AM 03/29/2023    4:05 PM 01/22/2023    3:39 PM 11/10/2022    9:35 AM 09/22/2022    2:12 PM 09/20/2022    7:52 AM  Advanced Directives  Does Patient Have a Medical Advance Directive? No No No No No No  Would patient like information on creating a medical advance directive? No - Patient declined  No - Patient declined       Current Medications (verified) Outpatient Encounter Medications as of 05/23/2023  Medication Sig   acetaminophen (TYLENOL) 500 MG tablet Take 2 tablets (1,000 mg total) by mouth every 6 (six) hours as needed. (Patient not taking: Reported on 05/03/2021)   ferrous sulfate 325 (65 FE) MG tablet Take 1 tablet (325 mg total) by mouth every other day.   ibuprofen (ADVIL) 600 MG tablet Take 1 tablet (600 mg total) by mouth every 6 (six) hours as needed.   Norgestimate-Ethinyl Estradiol Triphasic  (ORTHO TRI-CYCLEN LO) 0.18/0.215/0.25 MG-25 MCG tab Take 1 tablet by mouth daily.   Prenatal Vit-Fe Fumarate-FA (PRENATAL VITAMINS PO) Take by mouth.   Prenatal Vit-Fe Phos-FA-Omega (VITAFOL GUMMIES) 3.33-0.333-34.8 MG CHEW Chew 3 tablets by mouth daily.   senna-docusate (SENOKOT-S) 8.6-50 MG tablet Take 2 tablets by mouth daily as needed for mild constipation.   No facility-administered encounter medications on file as of 05/23/2023.    Allergies (verified) Patient has no known allergies.   History: Past Medical History:  Diagnosis Date   Anxiety    Cluster B personality disorder (HCC)    Depression    Medical history non-contributory    Past Surgical History:  Procedure Laterality Date   CESAREAN SECTION     CESAREAN SECTION  04/18/2021   CESAREAN SECTION WITH BILATERAL TUBAL LIGATION N/A 04/18/2021   Procedure: CESAREAN SECTION   Family History  Problem Relation Age of Onset   Diabetes Mother    Social History   Socioeconomic History   Marital status: Single    Spouse name: Not on file   Number of children: Not on file   Years of education: Not on file   Highest education level: Not on file  Occupational History   Not on file  Tobacco Use   Smoking status: Never   Smokeless tobacco: Never  Vaping Use   Vaping status: Never Used  Substance and Sexual Activity   Alcohol use: Not Currently    Alcohol/week: 2.0  standard drinks of alcohol    Types: 2 Glasses of wine per week   Drug use: Never   Sexual activity: Yes    Partners: Male  Other Topics Concern   Not on file  Social History Narrative   Not on file   Social Drivers of Health   Financial Resource Strain: High Risk (05/23/2023)   Overall Financial Resource Strain (CARDIA)    Difficulty of Paying Living Expenses: Very hard  Food Insecurity: Food Insecurity Present (05/23/2023)   Hunger Vital Sign    Worried About Running Out of Food in the Last Year: Often true    Ran Out of Food in the Last Year: Often  true  Transportation Needs: No Transportation Needs (05/23/2023)   PRAPARE - Administrator, Civil Service (Medical): No    Lack of Transportation (Non-Medical): No  Physical Activity: Inactive (05/23/2023)   Exercise Vital Sign    Days of Exercise per Week: 0 days    Minutes of Exercise per Session: 0 min  Stress: No Stress Concern Present (05/23/2023)   [employer] of Occupational Health - Occupational Stress Questionnaire    Feeling of Stress : Not at all  Social Connections: Moderately Isolated (05/23/2023)   Social Connection and Isolation Panel [NHANES]    Frequency of Communication with Friends and Family: Never    Frequency of Social Gatherings with Friends and Family: Never    Attends Religious Services: More than 4 times per year    Active Member of [employer] or Organizations: Yes    Attends Engineer, structural: More than 4 times per year    Marital Status: Never married    Tobacco Counseling Counseling given: Not Answered    Clinical Intake:  Pre-visit preparation completed: Yes  Pain : No/denies pain Pain Score: 0-No pain     BMI - recorded: 40.6 Nutritional Status: BMI > 30  Obese Nutritional Risks: None Diabetes: No  How often do you need to have someone help you when you read instructions, pamphlets, or other written materials from your doctor or pharmacy?: 1 - Never What is the last grade level you completed in school?: HSG  Interpreter Needed?: No  Information entered by :: Enora Trillo N. Ashante Yellin, LPN.   Activities of Daily Living     05/23/2023   10:48 AM 01/22/2023    3:38 PM  In your present state of health, do you have any difficulty performing the following activities:  Hearing? 0 0  Vision? 0 0  Difficulty concentrating or making decisions? 0 0  Walking or climbing stairs? 0 1  Dressing or bathing? 0 0  Doing errands, shopping? 0 1  Preparing Food and eating ? N   Using the Toilet? N   In the past six months, have you  accidently leaked urine? Y   Comment WEARS PROTECTION   Do you have problems with loss of bowel control? Y   Managing your Medications? N   Managing your Finances? N   Housekeeping or managing your Housekeeping? N     Patient Care Team: Colbert Coyer, Alyson Locket, MD as PCP - General (Internal Medicine)  Indicate any recent Medical Services you may have received from other than [facility] providers in the past year (date may be approximate).     Assessment:   This is a routine wellness examination for [facility].  Hearing/Vision screen Hearing Screening - Comments:: Denies hearing difficulties.  Vision Screening - Comments:: Wears rx glasses - up to date with routine  eye exams with [facility]    Goals Addressed             This Visit's Progress    Client understands the importance of follow-up with providers by attending scheduled visits         Depression Screen     05/23/2023   10:48 AM 03/07/2023    4:33 PM 01/22/2023    3:41 PM 04/11/2021   10:05 AM  PHQ 2/9 Scores  PHQ - 2 Score 0 4 4 1   PHQ- 9 Score 1 12 14 9     Fall Risk     05/23/2023   10:46 AM 03/07/2023    9:09 AM 01/22/2023    3:38 PM  Fall Risk   Falls in the past year? 1 0 0  Number falls in past yr: 1 0 0  Injury with Fall? 1 0 0  Risk for fall due to : History of fall(s);Impaired balance/gait;Orthopedic patient History of fall(s) No Fall Risks  Risk for fall due to: Comment SLIPPED IN THE SHOWER    Follow up Falls prevention discussed;Falls evaluation completed Falls evaluation completed Falls evaluation completed    MEDICARE RISK AT HOME:  Medicare Risk at Home Any stairs in or around the home?: No If so, are there any without handrails?: No Home free of loose throw rugs in walkways, pet beds, electrical cords, etc?: Yes Adequate lighting in your home to reduce risk of falls?: Yes Life alert?: No Use of a cane, walker or w/c?: No Grab bars in the bathroom?: No (VERY CAREFUL) Shower  chair or bench in shower?: No Elevated toilet seat or a handicapped toilet?: No  TIMED UP AND GO:  Was the test performed?  No  Cognitive Function: 6CIT completed    05/23/2023   10:48 AM  MMSE - Mini Mental State Exam  Not completed: Unable to complete        05/23/2023   10:47 AM  6CIT Screen  What Year? 0 points  What month? 0 points  What time? 0 points  Count back from 20 0 points  Months in reverse 0 points  Repeat phrase 0 points  Total Score 0 points    Immunizations Immunization History  Administered Date(s) Administered   Influenza, Seasonal, Injecte, Preservative Fre 01/22/2023   Influenza,inj,Quad PF,6+ Mos 03/10/2021   Pfizer(Comirnaty)Fall Seasonal Vaccine 12 years and older 11/15/2022   Tdap 03/10/2021    Screening Tests Health Maintenance  Topic Date Due   Cervical Cancer Screening (Pap smear)  03/10/2024   Medicare Annual Wellness (AWV)  05/22/2024   DTaP/Tdap/Td (2 - Td or Tdap) 03/11/2031   INFLUENZA VACCINE  Completed   COVID-19 Vaccine  Completed   Hepatitis C Screening  Completed   HIV Screening  Completed   HPV VACCINES  Aged Out    Health Maintenance  There are no preventive care reminders to display for this patient.  Health Maintenance Items Addressed: Yes, reviewed with patient. Patient stated that she has had covid vaccines that were given to her in [place].  Additional Screening:  Vision Screening: Recommended annual ophthalmology exams for early detection of glaucoma and other disorders of the eye.  Dental Screening: Recommended annual dental exams for proper oral hygiene  Community Resource Referral / Chronic Care Management: CRR required this visit?  No   CCM required this visit?  No     Plan:     I have personally reviewed and noted the following in the patient's chart:  Medical and social history Use of alcohol, tobacco or illicit drugs  Current medications and supplements including opioid prescriptions.  Patient is not currently taking opioid prescriptions. Functional ability and status Nutritional status Physical activity Advanced directives List of other physicians Hospitalizations, surgeries, and ER visits in previous 12 months Vitals Screenings to include cognitive, depression, and falls Referrals and appointments  In addition, I have reviewed and discussed with patient certain preventive protocols, quality metrics, and best practice recommendations. A written personalized care plan for preventive services as well as general preventive health recommendations were provided to patient.     Mickeal Needy, LPN   4/0/9811   After Visit Summary: (MyChart) Due to this being a telephonic visit, the after visit summary with patients personalized plan was offered to patient via MyChart   Notes: Please refer to Routing Comments.

## 2023-06-09 ENCOUNTER — Ambulatory Visit (HOSPITAL_COMMUNITY)
Admission: EM | Admit: 2023-06-09 | Discharge: 2023-06-10 | Disposition: A | Discharge status: Other Acute Inpatient Hospital | Attending: Psychiatry | Admitting: Psychiatry

## 2023-06-09 DIAGNOSIS — F32A Depression, unspecified: Secondary | ICD-10-CM | POA: Diagnosis not present

## 2023-06-09 DIAGNOSIS — F1910 Other psychoactive substance abuse, uncomplicated: Secondary | ICD-10-CM | POA: Insufficient documentation

## 2023-06-09 DIAGNOSIS — G4700 Insomnia, unspecified: Secondary | ICD-10-CM | POA: Insufficient documentation

## 2023-06-09 DIAGNOSIS — R410 Disorientation, unspecified: Secondary | ICD-10-CM | POA: Diagnosis not present

## 2023-06-09 DIAGNOSIS — F29 Unspecified psychosis not due to a substance or known physiological condition: Secondary | ICD-10-CM | POA: Diagnosis not present

## 2023-06-09 DIAGNOSIS — F22 Delusional disorders: Secondary | ICD-10-CM | POA: Diagnosis not present

## 2023-06-09 LAB — CBC WITH DIFFERENTIAL/PLATELET
Abs Immature Granulocytes: 0.01 10*3/uL (ref 0.00–0.07)
Basophils Absolute: 0.1 10*3/uL (ref 0.0–0.1)
Basophils Relative: 1 %
Eosinophils Absolute: 0 10*3/uL (ref 0.0–0.5)
Eosinophils Relative: 1 %
HCT: 41.5 % (ref 36.0–46.0)
Hemoglobin: 12.9 g/dL (ref 12.0–15.0)
Immature Granulocytes: 0 %
Lymphocytes Relative: 25 %
Lymphs Abs: 1.8 10*3/uL (ref 0.7–4.0)
MCH: 26 pg (ref 26.0–34.0)
MCHC: 31.1 g/dL (ref 30.0–36.0)
MCV: 83.5 fL (ref 80.0–100.0)
Monocytes Absolute: 0.6 10*3/uL (ref 0.1–1.0)
Monocytes Relative: 8 %
Neutro Abs: 4.8 10*3/uL (ref 1.7–7.7)
Neutrophils Relative %: 65 %
Platelets: 337 10*3/uL (ref 150–400)
RBC: 4.97 MIL/uL (ref 3.87–5.11)
RDW: 15.4 % (ref 11.5–15.5)
WBC: 7.3 10*3/uL (ref 4.0–10.5)
nRBC: 0 % (ref 0.0–0.2)

## 2023-06-09 LAB — POC URINE PREG, ED: Preg Test, Ur: NEGATIVE

## 2023-06-09 LAB — URINALYSIS, ROUTINE W REFLEX MICROSCOPIC
Bilirubin Urine: NEGATIVE
Glucose, UA: NEGATIVE mg/dL
Ketones, ur: NEGATIVE mg/dL
Leukocytes,Ua: NEGATIVE
Nitrite: NEGATIVE
Protein, ur: NEGATIVE mg/dL
Specific Gravity, Urine: <1.005 — ABNORMAL LOW (ref 1.005–1.030)
pH: 6.5 (ref 5.0–8.0)

## 2023-06-09 LAB — COMPREHENSIVE METABOLIC PANEL
ALT: 17 U/L (ref 0–44)
AST: 22 U/L (ref 15–41)
Albumin: 4.1 g/dL (ref 3.5–5.0)
Alkaline Phosphatase: 97 U/L (ref 38–126)
Anion gap: 9 (ref 5–15)
BUN: 6 mg/dL (ref 6–20)
CO2: 22 mmol/L (ref 22–32)
Calcium: 9.5 mg/dL (ref 8.9–10.3)
Chloride: 105 mmol/L (ref 98–111)
Creatinine, Ser: 0.66 mg/dL (ref 0.44–1.00)
GFR, Estimated: >60 mL/min (ref >60)
Glucose, Bld: 89 mg/dL (ref 70–99)
Potassium: 4.1 mmol/L (ref 3.5–5.1)
Sodium: 136 mmol/L (ref 135–145)
Total Bilirubin: 0.9 mg/dL (ref 0.0–1.2)
Total Protein: 7.4 g/dL (ref 6.5–8.1)

## 2023-06-09 LAB — URINALYSIS, MICROSCOPIC (REFLEX): WBC, UA: NONE SEEN WBC/hpf (ref 0–5)

## 2023-06-09 LAB — POCT URINE DRUG SCREEN - MANUAL ENTRY (I-SCREEN)
POC Amphetamine UR: NOT DETECTED
POC Buprenorphine (BUP): NOT DETECTED
POC Cocaine UR: NOT DETECTED
POC Marijuana UR: POSITIVE — AB
POC Methadone UR: NOT DETECTED
POC Methamphetamine UR: NOT DETECTED
POC Morphine: NOT DETECTED
POC Oxazepam (BZO): NOT DETECTED
POC Oxycodone UR: NOT DETECTED
POC Secobarbital (BAR): NOT DETECTED

## 2023-06-09 LAB — LIPID PANEL
Cholesterol: 154 mg/dL (ref 0–200)
HDL: 43 mg/dL (ref >40)
LDL Cholesterol: 101 mg/dL — ABNORMAL HIGH (ref 0–99)
Total CHOL/HDL Ratio: 3.6 ratio
Triglycerides: 51 mg/dL (ref <150)
VLDL: 10 mg/dL (ref 0–40)

## 2023-06-09 LAB — HEMOGLOBIN A1C
Hgb A1c MFr Bld: 4.9 % (ref 4.8–5.6)
Mean Plasma Glucose: 93.93 mg/dL

## 2023-06-09 LAB — HIV ANTIBODY (ROUTINE TESTING W REFLEX): HIV Screen 4th Generation wRfx: NONREACTIVE

## 2023-06-09 LAB — TSH: TSH: 2.309 u[IU]/mL (ref 0.350–4.500)

## 2023-06-09 LAB — ETHANOL: Alcohol, Ethyl (B): <10 mg/dL (ref <10)

## 2023-06-09 MED ORDER — ACETAMINOPHEN 325 MG PO TABS: 650.0000 mg | ORAL_TABLET | Freq: Four times a day (QID) | ORAL | Status: DC | PRN

## 2023-06-09 MED ORDER — HALOPERIDOL LACTATE 5 MG/ML IJ SOLN: 10.0000 mg | Freq: Three times a day (TID) | INTRAMUSCULAR | Status: DC | PRN

## 2023-06-09 MED ORDER — DIPHENHYDRAMINE HCL 50 MG PO CAPS: 50.0000 mg | ORAL_CAPSULE | Freq: Three times a day (TID) | ORAL | Status: DC | PRN

## 2023-06-09 MED ORDER — MAGNESIUM HYDROXIDE 400 MG/5ML PO SUSP: 30.0000 mL | Freq: Every day | ORAL | Status: DC | PRN

## 2023-06-09 MED ORDER — LORAZEPAM 2 MG/ML IJ SOLN: 2.0000 mg | Freq: Three times a day (TID) | INTRAMUSCULAR | Status: DC | PRN

## 2023-06-09 MED ORDER — HALOPERIDOL 5 MG PO TABS: 5.0000 mg | ORAL_TABLET | Freq: Three times a day (TID) | ORAL | Status: DC | PRN

## 2023-06-09 MED ORDER — DIPHENHYDRAMINE HCL 50 MG/ML IJ SOLN: 50.0000 mg | Freq: Three times a day (TID) | INTRAMUSCULAR | Status: DC | PRN

## 2023-06-09 MED ORDER — HYDROXYZINE HCL 25 MG PO TABS: 25.0000 mg | ORAL_TABLET | Freq: Three times a day (TID) | ORAL | Status: DC | PRN

## 2023-06-09 MED ORDER — HALOPERIDOL LACTATE 5 MG/ML IJ SOLN: 5.0000 mg | Freq: Three times a day (TID) | INTRAMUSCULAR | Status: DC | PRN

## 2023-06-09 MED ORDER — TRAZODONE HCL 50 MG PO TABS
50.0000 mg | ORAL_TABLET | Freq: Every evening | ORAL | Status: DC | PRN
Start: 2023-06-09 — End: 2023-06-10

## 2023-06-09 MED ORDER — ALUM & MAG HYDROXIDE-SIMETH 200-200-20 MG/5ML PO SUSP: 30.0000 mL | ORAL | Status: DC | PRN

## 2023-06-09 NOTE — Progress Notes (Signed)
 Patient has been denied by [facility] due to no appropriate beds available. Patient meets BH inpatient criteria per [name], NP. Patient has been faxed out to the following facilities:   [facility] [address] [place] [place] [address] [phone]  [facility] [address] [place] [address] [phone]  [facility] [address], [place] [address] [phone]  [facility] [address] [place] [address] [phone]  [facility] [address] [place] [address] [phone]  [facility] EFAX [address], [place] [phone]  [facility] [address] [place] [address] [phone]  [facility] [address] [place] [address] [phone]  [facility] Health Patient Placement [facility], [place] [phone]  [facility] [address] [place] [address] [phone]  [facility] [place]o B[place] [address] [phone]  [facility] [address] [place] [address] [phone]  [facility] [address] [place], [place] [address] [phone]  [facility] [address] [place] [address] [phone]  [facility] [address], [place] [address] [phone]  [facility] [address] [place] [address] [phone]  [facility] [address] [place] [address] [phone]  [facility]  [address], [place] [address] [phone]   Damita Dunnings, MSW, LCSW-A  3:41 PM 06/09/2023

## 2023-06-09 NOTE — ED Notes (Signed)
 Pt is calm and cooperative she denies SI/HI/AVH no pain or distress noted pt has some confusion and thought blocking she has to think when a question is asked of her will continue to monitor for safety

## 2023-06-09 NOTE — BH Assessment (Deleted)
 Comprehensive Clinical Assessment (CCA) Note  06/09/2023 Carly Brooks [id number]  Chief Complaint: Mental Health Evaluation Visit Diagnosis: Psychosis, Unspecified  Carly Brooks, a 28 year old female, presents voluntarily to the [facility] with a history of Bipolar Disorder. 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 She is unable to recall the current date or time and seems unaware of her current surroundings. She reports feeling paranoid and experiences auditory hallucinations, describing hearing "people's voices that are dead, like my godmother," which she states have been ongoing for the past seven months. The patient denies any suicidal or homicidal ideations. She reports alcohol use every other day or on weekends, with her most recent use occurring 1-2 weeks ago. Additionally, she reports daily use of THC, with her last use occurring prior to her arrival at the facility.  On Mental Status Examination (MSE), Carly Brooks presents as alert but markedly disoriented, as evidenced by her confusion about the current date, time, and her location. Her speech is coherent but tangential at times, with a notable preoccupation with the idea of locating her granddaughter. Her mood is described as "paranoid," and her affect is constricted, appearing somewhat flat with minimal emotional reactivity. She displays signs of auditory hallucinations, mentioning voices of deceased individuals, which suggests significant disturbance in reality testing. Her thought process is disorganized, with fragmented thoughts and a tendency to lose focus. She denies any suicidal or homicidal ideations and does not express any intent or plan for harm to herself or others. Carly Brooks demonstrates impaired insight into her condition, as she does not report current  psychiatric care or psychotropic medications, and is unable to recall previous psychiatric hospitalizations. Her judgment appears impaired due to her confusion and delusional thinking. Cognitive functioning appears grossly intact, though her attention and memory are significantly impaired, particularly in recalling basic orientation information.  The patient lives with the father of her two youngest children, who are 11 and 18 years old. She does not currently have a therapist or psychiatrist and has not been prescribed psychotropic medications. The possibility of previous inpatient psychiatric treatment is unclear, as she is unable to recall any such history. Given her confusion, auditory hallucinations, and impaired insight, further psychiatric evaluation and stabilization are recommended, per Carly Blue, NP. Marland Kitchen   CCA Screening, Triage and Referral (STR)  Patient Reported Information How did you hear about [place]? Self  What Is the Reason for Your Visit/Call Today? Carly Brooks is a 28 year old female who presents to the [facility] voluntarily. 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 She further mentions that she feels "everyone is dead and they keep texting me." She reports feeling paranoid and experiencing auditory hallucinations, describing "people's voices that are dead, like my godmother," which she states have been occurring for the past seven months. She denies suicidal and homicidal ideations. She reports alcohol use every other day or on weekends, with her last use occurring 1-2 weeks ago. She also reports daily THC use, with her last use occurring prior to arrival. The patient denies having a therapist or psychiatrist and denies being prescribed any psychotropic medications. She  is unable to recall whether she has ever received inpatient psychiatric  treatment. She lives with the father of her two youngest children, who are 21 and 41 years old.  How Long Has This Been Causing You Problems? > than 6 months  What Do You Feel Would Help You the Most Today? Medication(s); Treatment for Depression or other mood problem   Have You Recently Had Any Thoughts About Hurting Yourself? No  Are You Planning to Commit Suicide/Harm Yourself At This time? No   Flowsheet Row ED from 06/09/2023 in [facility] ED from 03/29/2023 in [facility] Emergency Department at [facility] ED from 11/10/2022 in [facility] Emergency Department at [facility]  C-SSRS RISK CATEGORY No Risk No Risk No Risk       Have you Recently Had Thoughts About Hurting Someone Carly Brooks? No  Are You Planning to Harm Someone at This Time? No  Explanation: No data recorded  Have You Used Any Alcohol or Drugs in the Past 24 Hours? Yes  How Long Ago Did You Use Drugs or Alcohol? Patient states that she used THC 3 hrs prior to arrival to the [facility].  What Did You Use and How Much? Patient reports using THC 3 hrs prior to arrival. Amount unkown stating, "I can't remember".   Do You Currently Have a Therapist/Psychiatrist? No data recorded Name of Therapist/Psychiatrist:    Have You Been Recently Discharged From Any Office Practice or Programs? No data recorded Explanation of Discharge From Practice/Program: No data recorded    CCA Screening Triage Referral Assessment Type of Contact: No data recorded Telemedicine Service Delivery:   Is this Initial or Reassessment?   Date Telepsych consult ordered in [facility]:    Time Telepsych consult ordered in [facility]:    Location of Assessment: No data recorded Provider Location: No data recorded  Collateral Involvement: No data recorded  Does Patient Have a Court Appointed Legal Guardian? No  Legal Guardian Contact Information: No  data recorded Copy of Legal Guardianship Form: No data recorded Legal Guardian Notified of Arrival: No data recorded Legal Guardian Notified of Pending Discharge: No data recorded If Minor and Not Living with Parent(s), Who has Custody? No data recorded Is CPS involved or ever been involved? No data recorded Is APS involved or ever been involved? No data recorded  Patient Determined To Be At Risk for Harm To Self or Others Based on Review of Patient Reported Information or Presenting Complaint? No data recorded Method: No data recorded Availability of Means: No data recorded Intent: No data recorded Notification Required: No data recorded Additional Information for Danger to Others Potential: No data recorded Additional Comments for Danger to Others Potential: No data recorded Are There Guns or Other Weapons in Your Home? No data recorded Types of Guns/Weapons: No data recorded Are These Weapons Safely Secured?                            No data recorded Who Could Verify You Are Able To Have These Secured: No data recorded Do You Have any Outstanding Charges, Pending Court Dates, Parole/Probation? No data recorded Contacted To Inform of Risk of Harm To Self or Others: No data recorded   Does Patient Present under Involuntary Commitment? No data recorded   [place] of Residence: No data recorded  Patient Currently Receiving the Following Services: No data recorded  Determination of Need: Urgent (48 hours)   Options For Referral: Inpatient Hospitalization; Outpatient Therapy; Medication Management  CCA Biopsychosocial Patient Reported Schizophrenia/Schizoaffective Diagnosis in Past: No   Strengths: Patient participates in the CCA, cooperative, calm. Willing to engage.   Mental Health Symptoms Depression:  Change in energy/activity; Difficulty Concentrating   Duration of Depressive symptoms: Duration of Depressive Symptoms: Greater than two weeks   Mania:   Irritability; Racing thoughts   Anxiety:   Difficulty concentrating; Irritability; Tension; Worrying; Restlessness; Fatigue   Psychosis:  Grossly disorganized or catatonic behavior; Delusions; Affective flattening/alogia/avolition; Hallucinations; Other negative symptoms   Duration of Psychotic symptoms: Duration of Psychotic Symptoms: Less than six months   Trauma:  None   Obsessions:  Cause anxiety; Disrupts routine/functioning; Poor insight; Intrusive/time consuming; Attempts to suppress/neutralize   Compulsions:  Disrupts with routine/functioning; Intrusive/time consuming; Poor Insight; Not connected to stressor   Inattention:  Avoids/dislikes activities that require focus   Hyperactivity/Impulsivity:  None   Oppositional/Defiant Behaviors:  None   Emotional Irregularity:  Mood lability   Other Mood/Personality Symptoms:  Patient is disheveled. Flat affect. Displays symptoms of psychosis, possibly dissociation.    Mental Status Exam Appearance and self-care  Stature:  Average   Weight:  Obese   Clothing:  Age-appropriate   Grooming:  Neglected   Cosmetic use:  None   Posture/gait:  Normal   Motor activity:  Not Remarkable   Sensorium  Attention:  Confused   Concentration:  Anxiety interferes; Preoccupied; Scattered   Orientation:  X5   Recall/memory:  Defective in Short-term; Defective in Immediate; Defective in Recent; Defective in Remote   Affect and Mood  Affect:  Blunted; Flat   Mood:  Anxious; Hopeless   Relating  Eye contact:  Staring   Facial expression:  Tense; Anxious   Attitude toward examiner:  Cooperative; Suspicious   Thought and Language  Speech flow: Clear and Coherent; Slow   Thought content:  Delusions   Preoccupation:  None   Hallucinations:  Auditory   Organization:  Disorganized   Company secretary of Knowledge:  Poor   Intelligence:  Average   Abstraction:  Concrete   Judgement:  Poor   Reality Testing:   Distorted; Unaware   Insight:  Gaps; Lacking   Decision Making:  Confused   Social Functioning  Social Maturity:  Isolates   Social Judgement:  Heedless   Stress  Stressors:  Other (Comment) ("I'm looking for my grand daughter")   Coping Ability:  Overwhelmed   Skill Deficits:  Decision making; Self-care   Supports:  Friends/Service system     Religion: Religion/Spirituality Are You A Religious Person?: No How Might This Affect Treatment?: n/a  Leisure/Recreation: Leisure / Recreation Do You Have Hobbies?: Yes Leisure and Hobbies: "Anything out doors"  Exercise/Diet: Exercise/Diet Do You Exercise?: Yes What Type of Exercise Do You Do?: Run/Walk How Many Times a Week Do You Exercise?: 1-3 times a week Have You Gained or Lost A Significant Amount of Weight in the Past Six Months?: No Do You Follow a Special Diet?: No Do You Have Any Trouble Sleeping?: Yes Explanation of Sleeping Difficulties: "I don't sleep alot". Patient unable to provide exact amount of hours she sleeps or describe the issues she has with sleeping.   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:  Chief Strategy Officer) Did Theme park manager?: No Did You Have An Individualized Education Program (IIEP):  No Did You Have Any Difficulty At School?: No Patient's Education Has Been Impacted by Current Illness: No   CCA Family/Childhood History Family and Relationship History: Family history Marital status: Long term relationship Long term relationship, how long?: on-going, 3 yrs or more What types of issues is patient dealing with in the relationship?: None reported Additional relationship information: None reported Does patient have children?:  Yes How many children?:  (3) How is patient's relationship with their children?: Due to patients presentation, unable to discern, or obtain information.  Childhood History:  Childhood History By whom was/is the patient raised?: Mother Did patient suffer any verbal/emotional/physical/sexual abuse as a child?: No Did patient suffer from severe childhood neglect?: No Has patient ever been sexually abused/assaulted/raped as an adolescent or adult?: No Was the patient ever a victim of a crime or a disaster?: No Witnessed domestic violence?: No Has patient been affected by domestic violence as an adult?: No       CCA Substance Use Alcohol/Drug Use: Alcohol / Drug Use Pain Medications: SEE MAR Prescriptions: SEE MAR Over the Counter: SEE MAR History of alcohol / drug use?: Yes Longest period of sobriety (when/how long): varies Negative Consequences of Use:  (Patient denies) Withdrawal Symptoms: None Substance #1 Name of Substance 1: Alcohol 1 - Age of First Use: 28 yrs old 1 - Amount (size/oz): varies 1 - Frequency: every other day or weekends only 1 - Duration: on-going 1 - Last Use / Amount: 1-2 weeks ago 1 - Method of Aquiring: varies 1- Route of Use: alcohol Substance #2 Name of Substance 2: THC 2 - Age of First Use: 28 years old 2 - Amount (size/oz): varies 2 - Frequency: every other day 2 - Duration: 6 months 2 - Last Use / Amount: 3 hrs prior to arrival 2 - Method of Aquiring: varies 2 - Route of Substance Use: smoke                     ASAM's:  Six Dimensions of Multidimensional Assessment  Dimension 1:  Acute Intoxication and/or Withdrawal Potential:      Dimension 2:  Biomedical Conditions and Complications:      Dimension 3:  Emotional, Behavioral, or Cognitive Conditions and Complications:     Dimension 4:  Readiness to Change:     Dimension 5:  Relapse, Continued use, or Continued Problem Potential:     Dimension 6:  Recovery/Living Environment:      ASAM Severity Score:    ASAM Recommended Level of Treatment:     Substance use Disorder (SUD) Substance Use Disorder (SUD)  Checklist Symptoms of Substance Use: Continued use despite having a persistent/recurrent physical/psychological problem caused/exacerbated by use, Continued use despite persistent or recurrent social, interpersonal problems, caused or exacerbated by use, Evidence of tolerance, Evidence of withdrawal (Comment), Large amounts of time spent to obtain, use or recover from the substance(s), Persistent desire or unsuccessful efforts to cut down or control use, Recurrent use that results in a failure to fulfill major role obligations (work, school, home), Repeated use in physically hazardous situations, Social, occupational, recreational activities given up or reduced due to use, Substance(s) often taken in larger amounts or over longer times than was intended, Presence of craving or strong urge to use  Recommendations for Services/Supports/Treatments: Recommendations for Services/Supports/Treatments Recommendations For Services/Supports/Treatments: Medication Management, Inpatient Hospitalization, Individual Therapy  Disposition Recommendation per psychiatric provider: We recommend inpatient psychiatric hospitalization when medically cleared. Patient is under voluntary admission status at this time;  please IVC if attempts to leave hospital.   DSM5 Diagnoses: Patient Active Problem List   Diagnosis Date Noted   Healthcare maintenance 03/07/2023   Hx of bipolar disorder 03/07/2023   Menorrhagia 03/07/2023   History of bilateral tubal ligation 01/24/2023   Cesarean section wound seroma, postpartum 05/03/2021   Status post repeat low transverse cesarean section 04/18/2021   Supervision of other normal pregnancy, antepartum 03/10/2021   History of cesarean delivery 03/10/2021   Morbid obesity with BMI of 40.0-44.9, adult (HCC) 02/16/2017   Depression 06/28/2015      Referrals to Alternative Service(s): Referred to Alternative Service(s):   Place:   Date:   Time:    Referred to Alternative Service(s):   Place:   Date:   Time:    Referred to Alternative Service(s):   Place:   Date:   Time:    Referred to Alternative Service(s):   Place:   Date:   Time:     Melynda Ripple, Counselor

## 2023-06-09 NOTE — Progress Notes (Signed)
   06/09/23 1416  [facility] Triage Screening (Walk-ins at [facility] only)  How Did You Hear About [place]? Self  What Is the Reason for Your Visit/Call Today? URGENT. Carly Brooks is a 28 year old female who presents to the [facility] voluntarily. 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 She further mentions that she feels "everyone is dead and they keep texting me." She reports feeling paranoid and experiencing auditory hallucinations, describing "people's voices that are dead, like my godmother," which she states have been occurring for the past seven months.  She denies suicidal and homicidal ideations. She reports alcohol use every other day or on weekends, with her last use occurring 1-2 weeks ago. She also reports daily THC use, with her last use occurring prior to arrival.  The patient denies having a therapist or psychiatrist and denies being prescribed any psychotropic medications. She is unable to recall whether she has ever received inpatient psychiatric treatment.  She lives with the father of her two youngest children, who are 33 and 22 years old.  How Long Has This Been Causing You Problems? > than 6 months  Have You Recently Had Any Thoughts About Hurting Yourself? No  Are You Planning to Commit Suicide/Harm Yourself At This time? No  Have you Recently Had Thoughts About Hurting Someone [name]? No  Are You Planning To Harm Someone At This Time? No  Physical Abuse Denies  Verbal Abuse Denies  Sexual Abuse Denies  Exploitation of patient/patient's resources Denies  Self-Neglect Denies  Possible abuse reported to: Other (Comment) (Patient denies hx of abuse.)  Are you currently experiencing any auditory, visual or other hallucinations? Yes  Please explain the hallucinations you are  currently experiencing: Patient mentions that she hears her deceased god mothers voice.  Have You Used Any Alcohol or Drugs in the Past 24 Hours? Yes  What Did You Use and How Much? Patient reports using THC 3 hrs prior to arrival. Amount unkown stating, "I can't remember"  Do you have any current medical co-morbidities that require immediate attention? No  Clinician description of patient physical appearance/behavior: Patient is guarded, suspicious, disheveled, paranoid.  What Do You Feel Would Help You the Most Today? Medication(s);Treatment for Depression or other mood problem  If access to [facility] Urgent [facility] was not available, would you have sought care in the Emergency Department? No  Determination of Need Urgent (48 hours)  Options For Referral Medication Management;Inpatient Hospitalization;Outpatient Therapy  Determination of Need filed? Yes
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
## 2023-06-09 NOTE — BH Assessment (Signed)
 Comprehensive Clinical Assessment (CCA) Note  06/09/2023 Carly Brooks [id number]  Disposition: Per [name], NP, patient meets criteria for inpatient treatment.   Chief Complaint: Mental Health Evaluation   Visit Diagnosis: Psychosis, Unspecified  Carly Brooks, a 28 year old female, presents voluntarily to the [facility] with a history of Bipolar Disorder. 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 She is unable to recall the current date or time and seems unaware of her current surroundings. She reports feeling paranoid and experiences auditory hallucinations, describing hearing "people's voices that are dead, like my godmother," which she states have been ongoing for the past seven months. The patient denies any suicidal or homicidal ideations. She reports alcohol use every other day or on weekends, with her most recent use occurring 1-2 weeks ago. Additionally, she reports daily use of THC, with her last use occurring prior to her arrival at the facility.  On Mental Status Examination (MSE), Carly Brooks presents as alert but markedly disoriented, as evidenced by her confusion about the current date, time, and her location. Her speech is coherent but tangential at times, with a notable preoccupation with the idea of locating her granddaughter. Her mood is described as "paranoid," and her affect is constricted, appearing somewhat flat with minimal emotional reactivity. She displays signs of auditory hallucinations, mentioning voices of deceased individuals, which suggests significant disturbance in reality testing. Her thought process is disorganized, with fragmented thoughts and a tendency to lose focus. She denies any suicidal or homicidal ideations and does not express any intent or plan for harm to herself or others.  Carly Brooks demonstrates impaired insight into her condition, as she does not report current psychiatric care or psychotropic medications, and is unable to recall previous psychiatric hospitalizations. Her judgment appears impaired due to her confusion and delusional thinking. Cognitive functioning appears grossly intact, though her attention and memory are significantly impaired, particularly in recalling basic orientation information.  The patient lives with the father of her two youngest children, who are 63 and 61 years old. She does not currently have a therapist or psychiatrist and has not been prescribed psychotropic medications. The possibility of previous inpatient psychiatric treatment is unclear, as she is unable to recall any such history. Given her confusion, auditory hallucinations, and impaired insight, further psychiatric evaluation and stabilization are recommended, per [name], NP. Marland Kitchen  CCA Screening, Triage and Referral (STR)  Patient Reported Information How did you hear about [place]? Self  What Is the Reason for Your Visit/Call Today? Carly Brooks is a 28 year old female who presents to the [facility] voluntarily. 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 She further mentions that she feels "everyone is dead and they keep texting me." She reports feeling paranoid and experiencing auditory hallucinations, describing "people's voices that are dead, like my godmother," which she states have been occurring for the past seven months. She denies suicidal and homicidal ideations. She reports alcohol use every other day or on weekends, with her last use occurring 1-2 weeks ago. She also reports daily THC use, with her last use occurring prior to arrival. The patient denies having a  therapist or psychiatrist and denies being prescribed any  psychotropic medications. She is unable to recall whether she has ever received inpatient psychiatric treatment. She lives with the father of her two youngest children, who are 24 and 28 years old.  How Long Has This Been Causing You Problems? > than 6 months  What Do You Feel Would Help You the Most Today? Medication(s); Treatment for Depression or other mood problem   Have You Recently Had Any Thoughts About Hurting Yourself? No  Are You Planning to Commit Suicide/Harm Yourself At This time? No   Flowsheet Row ED from 06/09/2023 in [facility] ED from 03/29/2023 in [facility] Emergency Department at [facility] ED from 11/10/2022 in [facility] Emergency Department at [facility]  C-SSRS RISK CATEGORY No Risk No Risk No Risk       Have you Recently Had Thoughts About Hurting Someone Carly Brooks? No  Are You Planning to Harm Someone at This Time? No  Explanation: n/a Have You Used Any Alcohol or Drugs in the Past 24 Hours? Yes  How Long Ago Did You Use Drugs or Alcohol? Patient states that she used THC 3 hrs prior to arrival to the [facility].  What Did You Use and How Much? Patient reports using THC 3 hrs prior to arrival. Amount unkown stating, "I can't remember".   Do You Currently Have a Therapist/Psychiatrist? No  Name of Therapist/Psychiatrist:  n/a  Have You Been Recently Discharged From Any Office Practice or Programs? No  Explanation of Discharge From Practice/Program: n/a    CCA Screening Triage Referral Assessment Type of Contact: Face-to-Face  Telemedicine Service Delivery:  n/a Is this Initial or Reassessment?  n/a Date Telepsych consult ordered in [facility]: n/a   Time Telepsych consult ordered in [facility]:  n/a   Location of Assessment: [facility] Assessment Services  Provider Location: [facility] Assessment Services   Collateral Involvement: n/a   Does Patient Have a Production manager Guardian? No  Legal Guardian Contact Information: n/a  Copy of Legal Guardianship Form: No - copy requested  Legal Guardian Notified of Arrival: -- (n/a, patient has no guardian.)  Legal Guardian Notified of Pending Discharge: -- (n/a, patient has no guardian.)  If Minor and Not Living with Parent(s), Who has Custody? n/a  Is CPS involved or ever been involved? Never  Is APS involved or ever been involved? Never   Patient Determined To Be At Risk for Harm To Self or Others Based on Review of Patient Reported Information or Presenting Complaint? No  Method: No Plan  Availability of Means: No access or NA  Intent: Vague intent or NA  Notification Required: No need or identified person  Additional Information for Danger to Others Potential: -- (n/a)  Additional Comments for Danger to Others Potential: Patient denies  Are There Guns or Other Weapons in Your Home? No  Types of Guns/Weapons: No guns or weapons.  Are These Weapons Safely Secured?                            No  Who Could Verify You Are Able To Have These Secured: n/a  Do You Have any Outstanding Charges, Pending Court Dates, Parole/Probation? n/a  Contacted To Inform of Risk of Harm To Self or Others: Other: Comment    Does Patient Present under Involuntary Commitment? No    [place] of Residence: [place]   Patient Currently Receiving the Following Services: -- (Patient has no services at this  time.)   Determination of Need: Urgent (48 hours)   Options For Referral: Inpatient Hospitalization; Outpatient Therapy; Medication Management     CCA Biopsychosocial Patient Reported Schizophrenia/Schizoaffective Diagnosis in Past: No   Strengths: Patient participates in the CCA, cooperative, calm. Willing to engage.   Mental Health Symptoms Depression:  Change in energy/activity; Difficulty Concentrating   Duration of Depressive symptoms: Duration of Depressive Symptoms: Greater  than two weeks   Mania:  Irritability; Racing thoughts   Anxiety:   Difficulty concentrating; Irritability; Tension; Worrying; Restlessness; Fatigue   Psychosis:  Grossly disorganized or catatonic behavior; Delusions; Affective flattening/alogia/avolition; Hallucinations; Other negative symptoms   Duration of Psychotic symptoms: Duration of Psychotic Symptoms: Less than six months   Trauma:  None   Obsessions:  Cause anxiety; Disrupts routine/functioning; Poor insight; Intrusive/time consuming; Attempts to suppress/neutralize   Compulsions:  Disrupts with routine/functioning; Intrusive/time consuming; Poor Insight; Not connected to stressor   Inattention:  Avoids/dislikes activities that require focus   Hyperactivity/Impulsivity:  None   Oppositional/Defiant Behaviors:  None   Emotional Irregularity:  Mood lability   Other Mood/Personality Symptoms:  Patient is disheveled. Flat affect. Displays symptoms of psychosis, possibly dissociation.    Mental Status Exam Appearance and self-care  Stature:  Average   Weight:  Obese   Clothing:  Age-appropriate   Grooming:  Neglected   Cosmetic use:  None   Posture/gait:  Normal   Motor activity:  Not Remarkable   Sensorium  Attention:  Confused   Concentration:  Anxiety interferes; Preoccupied; Scattered   Orientation:  X5   Recall/memory:  Defective in Short-term; Defective in Immediate; Defective in Recent; Defective in Remote   Affect and Mood  Affect:  Blunted; Flat   Mood:  Anxious; Hopeless   Relating  Eye contact:  Staring   Facial expression:  Tense; Anxious   Attitude toward examiner:  Cooperative; Suspicious   Thought and Language  Speech flow: Clear and Coherent; Slow   Thought content:  Delusions   Preoccupation:  None   Hallucinations:  Auditory   Organization:  Disorganized   Company secretary of Knowledge:  Poor   Intelligence:  Average   Abstraction:  Concrete   Judgement:   Poor   Reality Testing:  Distorted; Unaware   Insight:  Gaps; Lacking   Decision Making:  Confused   Social Functioning  Social Maturity:  Isolates   Social Judgement:  Heedless   Stress  Stressors:  Other (Comment) ("I'm looking for my grand daughter")   Coping Ability:  Overwhelmed   Skill Deficits:  Decision making; Self-care   Supports:  Friends/Service system     Religion: Religion/Spirituality Are You A Religious Person?: No How Might This Affect Treatment?: n/a  Leisure/Recreation: Leisure / Recreation Do You Have Hobbies?: Yes Leisure and Hobbies: "Anything out doors"  Exercise/Diet: Exercise/Diet Do You Exercise?: Yes What Type of Exercise Do You Do?: Run/Walk How Many Times a Week Do You Exercise?: 1-3 times a week Have You Gained or Lost A Significant Amount of Weight in the Past Six Months?: No Do You Follow a Special Diet?: No Do You Have Any Trouble Sleeping?: Yes Explanation of Sleeping Difficulties: "I don't sleep alot". Patient unable to provide exact amount of hours she sleeps or describe the issues she has with sleeping.   CCA Employment/Education Employment/Work Situation: Employment / Work Situation Employment Situation: Unemployed Patient's Job has Been Impacted by Current Illness: No Has Patient ever Been in Equities trader?: No  Education: Education Is  Patient Currently Attending School?: No Last Grade Completed:  (High School) Did You Attend College?: No Did You Have An Individualized Education Program (IIEP): No Did You Have Any Difficulty At School?: No Patient's Education Has Been Impacted by Current Illness: No   CCA Family/Childhood History Family and Relationship History: Family history Marital status: Long term relationship Long term relationship, how long?: on-going, 3 yrs or more What types of issues is patient dealing with in the relationship?: None reported Additional relationship information: None reported Does  patient have children?: Yes How many children?:  (3) How is patient's relationship with their children?: Due to patients presentation, unable to discern, or obtain information.  Childhood History:  Childhood History By whom was/is the patient raised?: Mother Did patient suffer any verbal/emotional/physical/sexual abuse as a child?: No Did patient suffer from severe childhood neglect?: No Has patient ever been sexually abused/assaulted/raped as an adolescent or adult?: No Was the patient ever a victim of a crime or a disaster?: No Witnessed domestic violence?: No Has patient been affected by domestic violence as an adult?: No       CCA Substance Use Alcohol/Drug Use: Alcohol / Drug Use Pain Medications: SEE MAR Prescriptions: SEE MAR Over the Counter: SEE MAR History of alcohol / drug use?: Yes Longest period of sobriety (when/how long): varies Negative Consequences of Use:  (Patient denies) Withdrawal Symptoms: None Substance #1 Name of Substance 1: Alcohol 1 - Age of First Use: 28 yrs old 1 - Amount (size/oz): varies 1 - Frequency: every other day or weekends only 1 - Duration: on-going 1 - Last Use / Amount: 1-2 weeks ago 1 - Method of Aquiring: varies 1- Route of Use: alcohol Substance #2 Name of Substance 2: THC 2 - Age of First Use: 28 years old 2 - Amount (size/oz): varies 2 - Frequency: every other day 2 - Duration: 6 months 2 - Last Use / Amount: 3 hrs prior to arrival 2 - Method of Aquiring: varies 2 - Route of Substance Use: smoke                     ASAM's:  Six Dimensions of Multidimensional Assessment  Dimension 1:  Acute Intoxication and/or Withdrawal Potential:      Dimension 2:  Biomedical Conditions and Complications:      Dimension 3:  Emotional, Behavioral, or Cognitive Conditions and Complications:     Dimension 4:  Readiness to Change:     Dimension 5:  Relapse, Continued use, or Continued Problem Potential:     Dimension 6:   Recovery/Living Environment:     ASAM Severity Score:    ASAM Recommended Level of Treatment:     Substance use Disorder (SUD) Substance Use Disorder (SUD)  Checklist Symptoms of Substance Use: Continued use despite having a persistent/recurrent physical/psychological problem caused/exacerbated by use, Continued use despite persistent or recurrent social, interpersonal problems, caused or exacerbated by use, Evidence of tolerance, Evidence of withdrawal (Comment), Large amounts of time spent to obtain, use or recover from the substance(s), Persistent desire or unsuccessful efforts to cut down or control use, Recurrent use that results in a failure to fulfill major role obligations (work, school, home), Repeated use in physically hazardous situations, Social, occupational, recreational activities given up or reduced due to use, Substance(s) often taken in larger amounts or over longer times than was intended, Presence of craving or strong urge to use  Recommendations for Services/Supports/Treatments: Recommendations for Services/Supports/Treatments Recommendations For Services/Supports/Treatments: Medication Management, Inpatient Hospitalization, Individual  Therapy  Disposition Recommendation per psychiatric provider: We recommend inpatient psychiatric hospitalization when medically cleared. Patient is under voluntary admission status at this time; please IVC if attempts to leave hospital.   DSM5 Diagnoses: Patient Active Problem List   Diagnosis Date Noted   Healthcare maintenance 03/07/2023   Hx of bipolar disorder 03/07/2023   Menorrhagia 03/07/2023   History of bilateral tubal ligation 01/24/2023   Cesarean section wound seroma, postpartum 05/03/2021   Status post repeat low transverse cesarean section 04/18/2021   Supervision of other normal pregnancy, antepartum 03/10/2021   History of cesarean delivery 03/10/2021   Morbid obesity with BMI of 40.0-44.9, adult (HCC) 02/16/2017    Depression 06/28/2015     Referrals to Alternative Service(s): Referred to Alternative Service(s):   Place:   Date:   Time:    Referred to Alternative Service(s):   Place:   Date:   Time:    Referred to Alternative Service(s):   Place:   Date:   Time:    Referred to Alternative Service(s):   Place:   Date:   Time:     Melynda Ripple, Counselor

## 2023-06-09 NOTE — ED Notes (Signed)
 Pt admitted to observation unit due to disorganized thought process. When writer asked pt what bought her to the [facility], pt responded, "at this point, y'all tell me why I'm here. I don't even know anymore. I just rather not say because I don't know". Pt presents agitated when asking her questions. Pt denies SI/HI/AVH. Pt vague with responses. Skin assessment completed and pt escorted to unit. Pt currently resting on bed.
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
## 2023-06-09 NOTE — ED Notes (Signed)
 Patient resting quietly in bed with eyes closed. Respirations equal and unlabored, skin warm and dry, NAD. Routine safety checks conducted according to facility protocol. Will continue to monitor for safety.

## 2023-06-09 NOTE — ED Notes (Signed)
 Pt sleeping in no acute distress. RR even and unlabored. Environment secured. Will continue to monitor for safety.

## 2023-06-10 LAB — RPR: RPR Ser Ql: NONREACTIVE

## 2023-06-10 NOTE — ED Notes (Signed)
 Pt refused breakfast

## 2023-06-10 NOTE — ED Provider Notes (Signed)
 FBC/OBS ASAP Discharge Summary  Date and Time: 06/10/2023 9:34 AM  Name: Carly Brooks  MRN:  [id number]   Discharge Diagnoses:  Final diagnoses:  Psychosis, unspecified psychosis type (HCC)    Subjective: "I'm okay but I am ready to go home"  Stay Summary: Carly Brooks 28 y.o., female patient presented to [facility] as a voluntary walk in accompanied by her boy friend with complaints of paranoia and delusional thinking.  Carly Brooks, is seen face to face by this provider, consulted with Dr. Enedina Finner; and chart reviewed on 06/10/23.  On evaluation Carly Brooks reports that she was here because her baby's father was concerned she needed to be seen. Patient states he just wanted her out of the house. States she usually goes to a relatives house to rest. Patient states she is ready to go home after getting plenty of sleep here. Patient states" I dont like leaving my toddlers with other people. They are not safe."  Patient asked how many toddlers. Patient replied " There are 2, I like to say 4 but 2." The patient reports " I  need sanitary pads because I  had a miscarriage and still having it." The patient denies SI, HI, and AVH.  During evaluation Carly Brooks is sitting in no acute distress.  She is alert & oriented x 4, calm, cooperative and attentive for this assessment.  Her mood is anxious with congruent affect.  She has normal speech, and behavior.  Objectively there is  evidence of delusional thinking. Patient does not appear to be responding to internal or external stimuli.  Patient is able to converse coherently, goal directed thoughts, no distractibility, or pre-occupation.  She also denies suicidal/self-harm/homicidal ideation, psychosis, and paranoia.  Patient answered question appropriately.    Total Time spent with patient: 30 minutes  Past Psychiatric History: Bipolar Past Medical History: No pertinent past medical history Family  History: No pertinent family history Family Psychiatric History: No pertinent family psychiatric history Social History: Patient lives with boyfriend and children. Tobacco Cessation:  N/A, patient does not currently use tobacco products  Current Medications:  Current Facility-Administered Medications  Medication Dose Route Frequency Provider Last Rate Last Admin   acetaminophen (TYLENOL) tablet 650 mg  650 mg Oral Q6H PRN Nkwenti, Doris, NP       alum & mag hydroxide-simeth (MAALOX/MYLANTA) 200-200-20 MG/5ML suspension 30 mL  30 mL Oral Q4H PRN Nkwenti, Doris, NP       haloperidol (HALDOL) tablet 5 mg  5 mg Oral TID PRN [name], NP       And   diphenhydrAMINE (BENADRYL) capsule 50 mg  50 mg Oral TID PRN [name], NP       haloperidol lactate (HALDOL) injection 10 mg  10 mg Intramuscular TID PRN [name], NP       And   diphenhydrAMINE (BENADRYL) injection 50 mg  50 mg Intramuscular TID PRN [name], NP       And   LORazepam (ATIVAN) injection 2 mg  2 mg Intramuscular TID PRN [name], NP       haloperidol lactate (HALDOL) injection 5 mg  5 mg Intramuscular TID PRN [name], NP       And   diphenhydrAMINE (BENADRYL) injection 50 mg  50 mg Intramuscular TID PRN [name], NP       And   LORazepam (ATIVAN) injection 2 mg  2 mg Intramuscular TID PRN [name], NP  hydrOXYzine (ATARAX) tablet 25 mg  25 mg Oral TID PRN [name], NP       magnesium hydroxide (MILK OF MAGNESIA) suspension 30 mL  30 mL Oral Daily PRN [name], NP       traZODone (DESYREL) tablet 50 mg  50 mg Oral QHS PRN [name], NP       Current Outpatient Medications  Medication Sig Dispense Refill   acetaminophen (TYLENOL) 500 MG tablet Take 2 tablets (1,000 mg total) by mouth every 6 (six) hours as needed. (Patient not taking: Reported on 05/03/2021) 30 tablet 0   ferrous sulfate 325 (65 FE) MG tablet Take 1 tablet (325 mg total) by mouth every other day.  30 tablet 0   ibuprofen (ADVIL) 600 MG tablet Take 1 tablet (600 mg total) by mouth every 6 (six) hours as needed. 30 tablet 0   Norgestimate-Ethinyl Estradiol Triphasic (ORTHO TRI-CYCLEN LO) 0.18/0.215/0.25 MG-25 MCG tab Take 1 tablet by mouth daily. 28 tablet 11   Prenatal Vit-Fe Fumarate-FA (PRENATAL VITAMINS PO) Take by mouth.     Prenatal Vit-Fe Phos-FA-Omega (VITAFOL GUMMIES) 3.33-0.333-34.8 MG CHEW Chew 3 tablets by mouth daily. 90 tablet 11   senna-docusate (SENOKOT-S) 8.6-50 MG tablet Take 2 tablets by mouth daily as needed for mild constipation. 20 tablet 0    PTA Medications:  Facility Ordered Medications  Medication   acetaminophen (TYLENOL) tablet 650 mg   alum & mag hydroxide-simeth (MAALOX/MYLANTA) 200-200-20 MG/5ML suspension 30 mL   magnesium hydroxide (MILK OF MAGNESIA) suspension 30 mL   haloperidol (HALDOL) tablet 5 mg   And   diphenhydrAMINE (BENADRYL) capsule 50 mg   haloperidol lactate (HALDOL) injection 10 mg   And   diphenhydrAMINE (BENADRYL) injection 50 mg   And   LORazepam (ATIVAN) injection 2 mg   haloperidol lactate (HALDOL) injection 5 mg   And   diphenhydrAMINE (BENADRYL) injection 50 mg   And   LORazepam (ATIVAN) injection 2 mg   hydrOXYzine (ATARAX) tablet 25 mg   traZODone (DESYREL) tablet 50 mg   PTA Medications  Medication Sig   Prenatal Vit-Fe Fumarate-FA (PRENATAL VITAMINS PO) Take by mouth.   ferrous sulfate 325 (65 FE) MG tablet Take 1 tablet (325 mg total) by mouth every other day.   ibuprofen (ADVIL) 600 MG tablet Take 1 tablet (600 mg total) by mouth every 6 (six) hours as needed.   acetaminophen (TYLENOL) 500 MG tablet Take 2 tablets (1,000 mg total) by mouth every 6 (six) hours as needed. (Patient not taking: Reported on 05/03/2021)   senna-docusate (SENOKOT-S) 8.6-50 MG tablet Take 2 tablets by mouth daily as needed for mild constipation.   Prenatal Vit-Fe Phos-FA-Omega (VITAFOL GUMMIES) 3.33-0.333-34.8 MG CHEW Chew 3 tablets by mouth  daily.   Norgestimate-Ethinyl Estradiol Triphasic (ORTHO TRI-CYCLEN LO) 0.18/0.215/0.25 MG-25 MCG tab Take 1 tablet by mouth daily.       05/23/2023   10:48 AM 03/07/2023    4:33 PM 01/22/2023    3:41 PM  Depression screen PHQ 2/9  Decreased Interest 0 2 2  Down, Depressed, Hopeless 0 2 2  PHQ - 2 Score 0 4 4  Altered sleeping 0 2 2  Tired, decreased energy 0 2 2  Change in appetite 0 2 2  Feeling bad or failure about yourself  0 2 2  Trouble concentrating 1 0 2  Moving slowly or fidgety/restless 0 0 0  Suicidal thoughts 0 0 0  PHQ-9 Score 1 12 14   Difficult doing work/chores Not  difficult at all Very difficult Somewhat difficult    Flowsheet Row ED from 06/09/2023 in [facility] ED from 03/29/2023 in [facility] Emergency Department at [facility] ED from 11/10/2022 in [facility] Emergency Department at [facility]  C-SSRS RISK CATEGORY No Risk No Risk No Risk       Musculoskeletal  Strength & Muscle Tone: within normal limits Gait & Station: normal Patient leans: N/A  Psychiatric Specialty Exam  Presentation  General Appearance:  Casual; Appropriate for Environment  Eye Contact: Good  Speech: Clear and Coherent  Speech Volume: Normal  Handedness: Right   Mood and Affect  Mood: Anxious; Irritable  Affect: Congruent   Thought Process  Thought Processes: Disorganized  Descriptions of Associations:Tangential  Orientation:Full (Time, Place and Person)  Thought Content:Illogical  Diagnosis of Schizophrenia or Schizoaffective disorder in past: No  Duration of Psychotic Symptoms: Less than six months   Hallucinations:Hallucinations: None Description of Auditory Hallucinations: denies  Ideas of Reference:None  Suicidal Thoughts:Suicidal Thoughts: No  Homicidal Thoughts:Homicidal Thoughts: No   Sensorium  Memory: Immediate Poor  Judgment: Poor  Insight: Poor   Executive Functions   Concentration: Fair  Attention Span: Fair  Recall: Poor  Fund of Knowledge: Poor  Language: Fair   Psychomotor Activity  Psychomotor Activity: Psychomotor Activity: Normal   Assets  Assets: Resilience   Sleep  Sleep: Sleep: Fair   No data recorded  Physical Exam  Physical Exam HENT:     Head: Normocephalic.     Nose: Nose normal.     Mouth/Throat:     Pharynx: Oropharynx is clear.  Eyes:     Extraocular Movements: Extraocular movements intact.  Cardiovascular:     Rate and Rhythm: Normal rate.  Pulmonary:     Effort: Pulmonary effort is normal.  Musculoskeletal:        General: Normal range of motion.     Cervical back: Normal range of motion.  Skin:    General: Skin is dry.  Neurological:     Mental Status: She is alert.    Review of Systems  Constitutional: Negative.   HENT: Negative.    Eyes: Negative.   Respiratory: Negative.    Cardiovascular: Negative.   Gastrointestinal: Negative.   Genitourinary: Negative.   Musculoskeletal: Negative.   Skin: Negative.   Neurological: Negative.   Psychiatric/Behavioral:  The patient is nervous/anxious.    Blood pressure 122/85, pulse 64, temperature 98.2 F (36.8 C), resp. rate 18, last menstrual period 06/21/2022, SpO2 97%, unknown if currently breastfeeding. There is no height or weight on file to calculate BMI.  Demographic Factors:  NA  Loss Factors: NA  Historical Factors: NA  Risk Reduction Factors:   Responsible for children under 29 years of age and Positive social support  Continued Clinical Symptoms:  Currently Psychotic  Cognitive Features That Contribute To Risk:  None    Suicide Risk:  Minimal: No identifiable suicidal ideation.  Patients presenting with no risk factors but with morbid ruminations; may be classified as minimal risk based on the severity of the depressive symptoms  Plan Of Care/Follow-up recommendations:  You are encouraged to follow up with [facility] for outpatient treatment.  Walk in/ Open Access Hours: Monday - Friday 8AM - 11AM (To see provider and therapist) Friday - 1PM - 4PM (To see therapist only)  [facility] [address] [place], [place] [phone]  Disposition: Patient recommended for inpatient admission. Patient discharged and readmitted  to [facility].  De Burrs, NP 06/10/2023, 9:34 AM

## 2023-06-10 NOTE — Progress Notes (Signed)
 Pt is awake and alert. Pt is irritable, guarded and thought blocking. No signs of acute distress noted. Pt denies pain and current SI/HI/AVH, plan or intent. Staff will monitor for pt's safety.

## 2023-06-10 NOTE — Discharge Summary (Addendum)
 Carly Brooks to be D/C'd to [facility] per NP order. Discussed with the patient and all questions fully answered. An After Visit Summary , EMTALA and Med Necessity forms were printed and to be given to the receiving nurse. All belongings returned .Patient escorted out, and D/C home via safe transport.  Carly Brooks  06/10/2023 10:20 AM

## 2023-06-10 NOTE — ED Notes (Signed)
 Pt sleeping@this  time breathing even and unlabored will continue to monitor for safety

## 2023-06-10 NOTE — ED Notes (Addendum)
 06/10/23 12:18 am  Carly Brooks  MRN: [id number]   Type of Contact and Topic: Psychiatric Bed Placement  Pt accepted to [facility] ([address])  Patient meets inpatient criteria per [name], NP  The  attending provider will be Dr. Loni Beckwith  Call report to [phone] or [phone]  Pt. Schedule to arrive at [facility] after 8 am   Rshell Liliane Shi, RN

## 2023-06-10 NOTE — Progress Notes (Signed)
 Report called to house supervisor Willette Cluster, [facility].

## 2023-06-29 ENCOUNTER — Ambulatory Visit (HOSPITAL_COMMUNITY)
Admission: EM | Admit: 2023-06-29 | Discharge: 2023-06-29 | Disposition: A | Discharge status: Home / Self Care | Attending: Psychiatry | Admitting: Psychiatry

## 2023-06-29 DIAGNOSIS — Z79899 Other long term (current) drug therapy: Secondary | ICD-10-CM | POA: Diagnosis not present

## 2023-06-29 DIAGNOSIS — F209 Schizophrenia, unspecified: Secondary | ICD-10-CM | POA: Insufficient documentation

## 2023-06-29 NOTE — Discharge Instructions (Addendum)
 Discussed methods to reduce the risk of self-injury or suicide attempts: Frequent conversations regarding unsafe thoughts. Locking/monitoring the use of all significant sharps, including knives, razor blades, pencil sharpener razors. If there is a firearm in the home, keeping the firearm unloaded, locking the firearm, locking the ammunition separately from the firearm, preventing access to the firearm and the ammunition. Locking/monitoring the use of medications, including over-the-counter medications and supplements. Having a responsible person dispense medications until patient has strengthened coping skills. Room checks for sharps or other harmful objects. Secure all chemical substances that can be ingested or inhaled. Securing any ligature risks. Calling 911/988/EMS or going to the nearest emergency room for any worsening of conditions. Also discussed with patient, and BF to bring patient back to this location if her symptoms worsen or if she starts having SI/HI/AVH.

## 2023-06-29 NOTE — Progress Notes (Signed)
   06/29/23 1409  [facility] Triage Screening (Walk-ins at [facility] only)  How Did You Hear About [place]? Self  What Is the Reason for Your Visit/Call Today? Carly Brooks is a 28 year old female presenting to [facility] unaccompanied. Pt reports that her boyfriend is gay and she has no place to live. Pt appears to be agitated and short worded throughout triage. Pt states, "I need mental health help and shelter now!" Pt reports daily marijuana use. Pt denies alcohol use, Si, Hi and Avh.  How Long Has This Been Causing You Problems? <Week  Have You Recently Had Any Thoughts About Hurting Yourself? No  Are You Planning to Commit Suicide/Harm Yourself At This time? No  Have you Recently Had Thoughts About Hurting Someone [name]? No  Are You Planning To Harm Someone At This Time? No  Physical Abuse Denies  Verbal Abuse Denies  Sexual Abuse Denies  Exploitation of patient/patient's resources Denies  Self-Neglect Denies  Possible abuse reported to: Other (Comment)  Are you currently experiencing any auditory, visual or other hallucinations? No  Have You Used Any Alcohol or Drugs in the Past 24 Hours? Yes  What Did You Use and How Much? marijuana, unsure on amount  Do you have any current medical co-morbidities that require immediate attention? No  Clinician description of patient physical appearance/behavior: irritable, upset, affect mood  What Do You Feel Would Help You the Most Today? Housing Assistance  If access to [facility] was not available, would you have sought care in the Emergency Department? No  Determination of Need Routine (7 days)  Options For Referral Other: Comment

## 2023-06-29 NOTE — ED Provider Notes (Signed)
 Behavioral Health Urgent Care Medical Screening Exam  Patient Name: Carly Brooks MRN: [id number] Date of Evaluation: 06/29/23 Chief Complaint: Wanting shelter Diagnosis:  Final diagnoses:  Schizophrenia, unspecified type (HCC)   History of Present illness: Carly Brooks is a 28 y.o. female with a prior reported mental health diagnosis of "personality disorder" who presents to the [facility] behavioral health today with complaints of, "I need mental health help and shelter now!".  Patient was seen at this location on 06/09/2023, recommendations made for inpatient hospitalization, after which patient was accepted by [facility], and transferred there for treatment and stabilization.  Assessment: During encounter with patient, she is more linear, more coherent, and more logical than the presentation on 03/22 when writer saw and assessed her then.  Patient however continues to present with some lingering disorganized thoughts; she states that she is homeless, but is currently residing with her boyfriend, who is also the father of her children, ages 42 and 5 years old.  She tells Clinical research associate "I am naked in pull-ups and with the toddlers."  She reports being discharged from [facility] yesterday, states that she has been taking her medications.  She states to Clinical research associate that she has been homeless x 3 years, going around in her car, states "I am [place]" referring to residing in her car and going around.  She is unsure what medications she is currently on, or what was prescribed to her at [facility].  She denies SI/HI/AVH.  Denies paranoia.  Denies first rank symptoms today.  Collateral information from boyfriend: Clinical research associate called patient's boyfriend with whom she resides in an effort to get collateral information, after getting her consent.  Call was placed to the following person, at the following phone number: Carly Brooks (Significant other) [phone] (Mobile)   Significant other shares that patient was recently discharged from [facility], and he has been insuring that she takes her medications on a daily basis, reports that patient is on Seroquel 200 mg nightly, and Abilify 20 mg daily.  He reports that patient remains paranoid, and saying things that "make no sense", and states that he has no family in the area, and is unable to support patient, since he is also responsible for the 2 children, ages 42 and 61 years old.  Mr. Carly Brooks shares that having patient reside with him is like having a teenager at home, when she is supposed to be his support person.  He shares that he cannot keep residing with patient, and that we should call patient's mother to come and get her from this facility.  He reports that psychosis began last year in August, reports that patient has been complaining of feelings of tiredness after taking the Abilify and the Seroquel, he shares that it is becoming too much taking care of patient, and he cannot keep doing it.  He reports that patient's only past diagnosis has been borderline personality disorder.  He however is unable to identify any safety concerns with patient, denies that patient has done anything to bring that the kids at danger, denies that patient has threatened danger to herself, himself, or the children since coming home from the hospital.  Writer educated Mr. Carly Brooks to bring patient back here to this urgent care on on Monday, 07/02/23, and present to the second floor, to open access, to establish care for mental health which includes medication management and therapy, since he states that the referral provided by [facility] has expired.  Mr.  Carly Brooks verbalized understanding, stated that he will be at this facility within an hour to pick patient back up.  At this time based on information gathered from patient, has significant order, and chart review, it can be concluded at this time that patient's correct diagnosis is  Schizophrenia, unspecified at this time. She is in need of outpatient management, which includes medication management, therapy, and she will also benefit from having a community support team such as the ACTT to assist with outpatient services.  The determination regarding which medications are better for patient's treatment, will be deferred to outpatient provider.  No medication changes will be made at this time.  Case discussed with Dr. Kathie Rhodes who is in agreement with findings and recommendations above.   Flowsheet Row ED from 06/09/2023 in [facility] ED from 03/29/2023 in [facility] Emergency Department at [facility] ED from 11/10/2022 in [facility] Emergency Department at [facility]  C-SSRS RISK CATEGORY No Risk No Risk No Risk       Psychiatric Specialty Exam  Presentation  General Appearance:Fairly Groomed  Eye Contact:Fair  Speech:Clear and Coherent  Speech Volume:Normal  Handedness:Right   Mood and Affect  Mood: Depressed; Anxious  Affect: Congruent   Thought Process  Thought Processes: Disorganized  Descriptions of Associations:Intact  Orientation:Partial  Thought Content:Illogical  Diagnosis of Schizophrenia or Schizoaffective disorder in past: No  Duration of Psychotic Symptoms: Greater than six months  Hallucinations:None denies  Ideas of Reference:None  Suicidal Thoughts:No  Homicidal Thoughts:No   Sensorium  Memory: Immediate Fair  Judgment: Fair  Insight: Fair   Art therapist  Concentration: Fair  Attention Span: Fair  Recall: [employer] of Knowledge: Fair  Language: Fair   Psychomotor Activity  Psychomotor Activity: Normal   Assets  Assets: Resilience   Sleep  Sleep: Fair  Number of hours: No data recorded  Physical Exam: Physical Exam Constitutional:      Appearance: Normal appearance.  Musculoskeletal:     Cervical back: Normal range of motion.   Neurological:     Mental Status: She is alert.  Psychiatric:        Mood and Affect: Mood normal.    Review of Systems  Psychiatric/Behavioral:  Positive for depression. Negative for hallucinations, memory loss, substance abuse and suicidal ideas. The patient is nervous/anxious and has insomnia.   All other systems reviewed and are negative.  Blood pressure 135/77, pulse 88, resp. rate 19, SpO2 99%, unknown if currently breastfeeding. There is no height or weight on file to calculate BMI.  Musculoskeletal: Strength & Muscle Tone: within normal limits Gait & Station: normal Patient leans: N/A   [facility] MSE Discharge Disposition for Follow up and Recommendations: Based on my evaluation the patient does not appear to have an emergency medical condition and can be discharged with resources and follow up care in outpatient services for Medication Management and Individual Therapy  Educated to come back to this location upstairs to open access next Monday to establish care for medication management and therapy, verbalizes understanding. -Asked CSW to refer for ACTT services.  [name], NP 06/29/2023, 3:51 PM

## 2023-06-30 ENCOUNTER — Encounter (HOSPITAL_COMMUNITY): Payer: Self-pay | Admitting: Emergency Medicine

## 2023-06-30 ENCOUNTER — Other Ambulatory Visit: Payer: Self-pay

## 2023-06-30 ENCOUNTER — Emergency Department (HOSPITAL_COMMUNITY)
Admission: EM | Admit: 2023-06-30 | Discharge: 2023-07-01 | Disposition: A | Discharge status: Other Acute Inpatient Hospital | Source: Home / Self Care | Attending: Emergency Medicine | Admitting: Emergency Medicine

## 2023-06-30 DIAGNOSIS — R451 Restlessness and agitation: Secondary | ICD-10-CM | POA: Insufficient documentation

## 2023-06-30 DIAGNOSIS — F12250 Cannabis dependence with psychotic disorder with delusions: Secondary | ICD-10-CM | POA: Diagnosis not present

## 2023-06-30 DIAGNOSIS — F209 Schizophrenia, unspecified: Secondary | ICD-10-CM | POA: Diagnosis not present

## 2023-06-30 LAB — COMPREHENSIVE METABOLIC PANEL WITH GFR
ALT: 96 U/L — ABNORMAL HIGH (ref 0–44)
AST: 95 U/L — ABNORMAL HIGH (ref 15–41)
Albumin: 3.8 g/dL (ref 3.5–5.0)
Alkaline Phosphatase: 115 U/L (ref 38–126)
Anion gap: 6 (ref 5–15)
BUN: 8 mg/dL (ref 6–20)
CO2: 24 mmol/L (ref 22–32)
Calcium: 9 mg/dL (ref 8.9–10.3)
Chloride: 106 mmol/L (ref 98–111)
Creatinine, Ser: 0.67 mg/dL (ref 0.44–1.00)
GFR, Estimated: >60 mL/min (ref >60)
Glucose, Bld: 109 mg/dL — ABNORMAL HIGH (ref 70–99)
Potassium: 3.8 mmol/L (ref 3.5–5.1)
Sodium: 136 mmol/L (ref 135–145)
Total Bilirubin: 0.3 mg/dL (ref 0.0–1.2)
Total Protein: 7.8 g/dL (ref 6.5–8.1)

## 2023-06-30 LAB — CBC WITH DIFFERENTIAL/PLATELET
Abs Immature Granulocytes: 0.02 10*3/uL (ref 0.00–0.07)
Basophils Absolute: 0.1 10*3/uL (ref 0.0–0.1)
Basophils Relative: 1 %
Eosinophils Absolute: 0.1 10*3/uL (ref 0.0–0.5)
Eosinophils Relative: 2 %
HCT: 38.5 % (ref 36.0–46.0)
Hemoglobin: 11.9 g/dL — ABNORMAL LOW (ref 12.0–15.0)
Immature Granulocytes: 0 %
Lymphocytes Relative: 29 %
Lymphs Abs: 1.9 10*3/uL (ref 0.7–4.0)
MCH: 26.2 pg (ref 26.0–34.0)
MCHC: 30.9 g/dL (ref 30.0–36.0)
MCV: 84.8 fL (ref 80.0–100.0)
Monocytes Absolute: 0.5 10*3/uL (ref 0.1–1.0)
Monocytes Relative: 8 %
Neutro Abs: 4 10*3/uL (ref 1.7–7.7)
Neutrophils Relative %: 60 %
Platelets: 338 10*3/uL (ref 150–400)
RBC: 4.54 MIL/uL (ref 3.87–5.11)
RDW: 15.4 % (ref 11.5–15.5)
WBC: 6.6 10*3/uL (ref 4.0–10.5)
nRBC: 0 % (ref 0.0–0.2)

## 2023-06-30 LAB — HCG, SERUM, QUALITATIVE: Preg, Serum: NEGATIVE

## 2023-06-30 LAB — RAPID URINE DRUG SCREEN, HOSP PERFORMED
Amphetamines: NOT DETECTED
Barbiturates: NOT DETECTED
Benzodiazepines: NOT DETECTED
Cocaine: NOT DETECTED
Opiates: NOT DETECTED
Tetrahydrocannabinol: POSITIVE — AB

## 2023-06-30 LAB — ETHANOL: Alcohol, Ethyl (B): <10 mg/dL (ref <10)

## 2023-06-30 MED ORDER — LORAZEPAM 1 MG PO TABS
1.0000 mg | ORAL_TABLET | Freq: Once | ORAL | Status: AC
  Administered 2023-06-30: 1 mg via ORAL
  Filled 2023-06-30: qty 1
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
## 2023-06-30 NOTE — ED Notes (Signed)
 Patient is walking the hall naked with a blanket drapped off of her    she has been told to not come out of her room without any clothes on

## 2023-06-30 NOTE — ED Triage Notes (Addendum)
 Patient to ED by [employer] dor psych eval. Per patient she has a HX of Schizophrenia but is compliant with medication. She denies SI/HI but states she was overstimulated by dogs barking. She states she begin to cry scream and was smoking. Per [employer] patient partner called them due to patient being agitated. She voiced to them she was a danger to herself and others. Was sent to [facility] yesterday but did not meet criteria.

## 2023-06-30 NOTE — ED Provider Notes (Addendum)
 [place] EMERGENCY DEPARTMENT AT [facility] Provider Note   CSN: [id number] Arrival date & time: 06/30/23  1522     History  Chief Complaint  Patient presents with   Psychiatric Evaluation    Carly Brooks is a 28 y.o. female.  HPI   28 year old female presents to the emergency department with concern for agitation.  Patient states that she was at home when the dog started barking, this set off her becoming overstimulated.  Her partner that was with her called police as she was escalating.  She told police that she was a danger to herself and others and needed to come in to be evaluated.  Patient states that she has been compliant with her schizophrenia.  She is not fully forthcoming with information but states that she needs to be reevaluated by psychiatry.  [facility] and did not meet inpatient criteria per records.  She denies any specific SI/HI but she states that she feels dangerous being at home right now. Denies ingestion or any sort.  Home Medications Prior to Admission medications   Medication Sig Start Date End Date Taking? Authorizing Provider  acetaminophen (TYLENOL) 500 MG tablet Take 2 tablets (1,000 mg total) by mouth every 6 (six) hours as needed. Patient not taking: Reported on 05/03/2021 04/20/21   Kenneth Peace, DO  ARIPiprazole (ABILIFY) 20 MG tablet Take 20 mg by mouth daily. 06/22/23   [provider]  ferrous sulfate 325 (65 FE) MG tablet Take 1 tablet (325 mg total) by mouth every other day. 04/21/21   Kenneth Peace, DO  ibuprofen (ADVIL) 600 MG tablet Take 1 tablet (600 mg total) by mouth every 6 (six) hours as needed. 04/20/21   Kenneth Peace, DO  Norgestimate-Ethinyl Estradiol Triphasic (ORTHO TRI-CYCLEN LO) 0.18/0.215/0.25 MG-25 MCG tab Take 1 tablet by mouth daily. 01/22/23 01/22/24  Arellano Zameza, Priscila, MD  Prenatal Vit-Fe Fumarate-FA (PRENATAL VITAMINS PO) Take by mouth.    [provider]  Prenatal Vit-Fe Phos-FA-Omega (VITAFOL GUMMIES) 3.33-0.333-34.8 MG CHEW Chew 3 tablets by mouth daily. 08/02/22   Ervin, Michael L, MD  QUEtiapine (SEROQUEL) 200 MG tablet Take 200 mg by mouth at bedtime. 06/22/23   [provider]  senna-docusate (SENOKOT-S) 8.6-50 MG tablet Take 2 tablets by mouth daily as needed for mild constipation. 04/20/21   Kenneth Peace, DO      Allergies    Patient has no known allergies.    Review of Systems   Review of Systems  Constitutional:  Negative for fever.  Respiratory:  Negative for shortness of breath.   Cardiovascular:  Negative for chest pain.  Gastrointestinal:  Negative for abdominal pain.  Psychiatric/Behavioral:  Positive for agitation, behavioral problems, decreased concentration, dysphoric mood and sleep disturbance. Negative for hallucinations and suicidal ideas. The patient is nervous/anxious.     Physical Exam Updated Vital Signs BP (!) 143/89 (BP Location: Left Arm)   Pulse 96   Temp 98.3 F (36.8 C) (Oral)   Resp 16   Ht 5\' 9"  (1.753 m)   Wt 127 kg   LMP 06/01/2023   SpO2 100%   BMI 41.35 kg/m  Physical Exam Vitals and nursing note reviewed.  Constitutional:      General: She is not in acute distress.    Appearance: Normal appearance.  HENT:     Head: Normocephalic.     Mouth/Throat:     Mouth: Mucous membranes are moist.  Cardiovascular:  Rate and Rhythm: Normal rate.  Pulmonary:     Effort: Pulmonary effort is normal. No respiratory distress.  Abdominal:     Palpations: Abdomen is soft.     Tenderness: There is no abdominal tenderness.  Skin:    General: Skin is warm.  Neurological:     Mental Status: She is alert. Mental status is at baseline.  Psychiatric:        Mood and Affect: Mood normal.     ED Results / Procedures / Treatments   Labs (all labs ordered are listed, but only abnormal results are displayed) Labs Reviewed  COMPREHENSIVE METABOLIC PANEL WITH GFR  ETHANOL   RAPID URINE DRUG SCREEN, HOSP PERFORMED  CBC WITH DIFFERENTIAL/PLATELET  HCG, SERUM, QUALITATIVE    EKG None  Radiology No results found.  Procedures Procedures    Medications Ordered in ED Medications  LORazepam (ATIVAN) tablet 1 mg (1 mg Oral Given 06/30/23 1734)    ED Course/ Medical Decision Making/ A&P                                 Medical Decision Making Amount and/or Complexity of Data Reviewed Labs: ordered.  Risk Prescription drug management.   28 year old female presents emergency department after becoming overstimulated by dogs barking.  Patient was so agitated that police had to be called and she is requesting psychiatric evaluation as she feels "dangerous" being at home.  She does not offer any other specific complaints.  Denies any SI/HI.  No criteria for IVC at this time, she is voluntary.  Patient initially stripped down and was walking through the hallway naked, she required oral medication and verbal redirection.  Will plan for psychiatric med clearance and forward to TTS.  Vitals are stable, she is otherwise well-appearing and cooperative at this time.        Final Clinical Impression(s) / ED Diagnoses Final diagnoses:  None    Rx / DC Orders ED Discharge Orders     None         Flonnie Humphrey, DO 06/30/23 1941    Larwence Tu, Sidra Dredge, DO 06/30/23 2025

## 2023-06-30 NOTE — ED Notes (Signed)
 Patient has had to be directed multiple times to put her clothes back on. She will take her clothes off and walk down the hallway. She is currently refusing to put her shirt on while in the hallway.

## 2023-06-30 NOTE — ED Notes (Signed)
 2 bags of patient belongings were placed behind the 23-25 nurses station.

## 2023-06-30 NOTE — ED Notes (Signed)
 Patient just attempted to enter into another patient's room.

## 2023-07-01 ENCOUNTER — Encounter (HOSPITAL_COMMUNITY): Payer: Self-pay | Admitting: Psychiatry

## 2023-07-01 ENCOUNTER — Other Ambulatory Visit: Payer: Self-pay

## 2023-07-01 ENCOUNTER — Inpatient Hospital Stay (HOSPITAL_COMMUNITY)
Admission: AD | Admit: 2023-07-01 | Discharge: 2023-07-12 | DRG: 885 | Disposition: A | Discharge status: Home / Self Care | Source: Intra-hospital | Attending: Psychiatry | Admitting: Psychiatry

## 2023-07-01 DIAGNOSIS — Z653 Problems related to other legal circumstances: Secondary | ICD-10-CM | POA: Diagnosis not present

## 2023-07-01 DIAGNOSIS — F12250 Cannabis dependence with psychotic disorder with delusions: Secondary | ICD-10-CM | POA: Diagnosis present

## 2023-07-01 DIAGNOSIS — F32A Depression, unspecified: Secondary | ICD-10-CM | POA: Diagnosis present

## 2023-07-01 DIAGNOSIS — Z5948 Other specified lack of adequate food: Secondary | ICD-10-CM | POA: Diagnosis not present

## 2023-07-01 DIAGNOSIS — Z79899 Other long term (current) drug therapy: Secondary | ICD-10-CM

## 2023-07-01 DIAGNOSIS — Z5941 Food insecurity: Secondary | ICD-10-CM

## 2023-07-01 DIAGNOSIS — Z6841 Body Mass Index (BMI) 40.0 and over, adult: Secondary | ICD-10-CM | POA: Diagnosis not present

## 2023-07-01 DIAGNOSIS — Z833 Family history of diabetes mellitus: Secondary | ICD-10-CM

## 2023-07-01 DIAGNOSIS — F200 Paranoid schizophrenia: Secondary | ICD-10-CM | POA: Diagnosis not present

## 2023-07-01 DIAGNOSIS — R45851 Suicidal ideations: Secondary | ICD-10-CM | POA: Diagnosis not present

## 2023-07-01 DIAGNOSIS — Z5986 Financial insecurity: Secondary | ICD-10-CM | POA: Diagnosis not present

## 2023-07-01 DIAGNOSIS — Z5901 Sheltered homelessness: Secondary | ICD-10-CM | POA: Diagnosis not present

## 2023-07-01 DIAGNOSIS — R7989 Other specified abnormal findings of blood chemistry: Secondary | ICD-10-CM | POA: Diagnosis not present

## 2023-07-01 DIAGNOSIS — G479 Sleep disorder, unspecified: Secondary | ICD-10-CM | POA: Diagnosis present

## 2023-07-01 DIAGNOSIS — F12259 Cannabis dependence with psychotic disorder, unspecified: Secondary | ICD-10-CM | POA: Insufficient documentation

## 2023-07-01 DIAGNOSIS — F209 Schizophrenia, unspecified: Secondary | ICD-10-CM | POA: Diagnosis present

## 2023-07-01 DIAGNOSIS — F203 Undifferentiated schizophrenia: Secondary | ICD-10-CM | POA: Diagnosis not present

## 2023-07-01 DIAGNOSIS — F333 Major depressive disorder, recurrent, severe with psychotic symptoms: Secondary | ICD-10-CM | POA: Diagnosis not present

## 2023-07-01 DIAGNOSIS — F319 Bipolar disorder, unspecified: Secondary | ICD-10-CM | POA: Diagnosis not present

## 2023-07-01 MED ORDER — HALOPERIDOL LACTATE 5 MG/ML IJ SOLN
5.0000 mg | Freq: Three times a day (TID) | INTRAMUSCULAR | Status: DC | PRN
  Administered 2023-07-05: 5 mg via INTRAMUSCULAR
  Filled 2023-07-01: qty 1

## 2023-07-01 MED ORDER — TRAZODONE HCL 50 MG PO TABS
50.0000 mg | ORAL_TABLET | Freq: Every evening | ORAL | Status: DC | PRN
  Administered 2023-07-01 – 2023-07-02 (×2): 50 mg via ORAL
  Filled 2023-07-01 (×2): qty 1

## 2023-07-01 MED ORDER — ARIPIPRAZOLE 10 MG PO TABS
20.0000 mg | ORAL_TABLET | Freq: Every day | ORAL | Status: DC
  Filled 2023-07-01: qty 2

## 2023-07-01 MED ORDER — DIPHENHYDRAMINE HCL 25 MG PO CAPS
50.0000 mg | ORAL_CAPSULE | Freq: Three times a day (TID) | ORAL | Status: DC | PRN
  Administered 2023-07-03: 50 mg via ORAL
  Filled 2023-07-01: qty 2

## 2023-07-01 MED ORDER — ARIPIPRAZOLE 10 MG PO TABS
20.0000 mg | ORAL_TABLET | Freq: Every day | ORAL | Status: DC
  Administered 2023-07-01: 20 mg via ORAL

## 2023-07-01 MED ORDER — LORAZEPAM 2 MG/ML IJ SOLN
2.0000 mg | Freq: Three times a day (TID) | INTRAMUSCULAR | Status: DC | PRN
  Administered 2023-07-05: 2 mg via INTRAMUSCULAR
  Filled 2023-07-01 (×2): qty 1

## 2023-07-01 MED ORDER — DIPHENHYDRAMINE HCL 50 MG/ML IJ SOLN
50.0000 mg | Freq: Three times a day (TID) | INTRAMUSCULAR | Status: DC | PRN
  Administered 2023-07-05: 50 mg via INTRAMUSCULAR
  Filled 2023-07-01: qty 1

## 2023-07-01 MED ORDER — QUETIAPINE FUMARATE 100 MG PO TABS: 100.0000 mg | ORAL_TABLET | Freq: Every day | ORAL | Status: DC

## 2023-07-01 MED ORDER — HYDROXYZINE HCL 25 MG PO TABS: 25.0000 mg | ORAL_TABLET | Freq: Three times a day (TID) | ORAL | Status: DC | PRN

## 2023-07-01 MED ORDER — HALOPERIDOL 5 MG PO TABS
5.0000 mg | ORAL_TABLET | Freq: Three times a day (TID) | ORAL | Status: DC | PRN
  Administered 2023-07-03: 5 mg via ORAL
  Filled 2023-07-01: qty 1

## 2023-07-01 MED ORDER — MAGNESIUM HYDROXIDE 400 MG/5ML PO SUSP: 30.0000 mL | Freq: Every day | ORAL | Status: DC | PRN

## 2023-07-01 MED ORDER — HALOPERIDOL LACTATE 5 MG/ML IJ SOLN
10.0000 mg | Freq: Three times a day (TID) | INTRAMUSCULAR | Status: DC | PRN
  Administered 2023-07-10: 10 mg via INTRAMUSCULAR
  Filled 2023-07-01: qty 2

## 2023-07-01 MED ORDER — LORAZEPAM 2 MG/ML IJ SOLN
2.0000 mg | Freq: Three times a day (TID) | INTRAMUSCULAR | Status: DC | PRN
  Administered 2023-07-10: 2 mg via INTRAMUSCULAR

## 2023-07-01 MED ORDER — HYDROXYZINE HCL 25 MG PO TABS
25.0000 mg | ORAL_TABLET | Freq: Three times a day (TID) | ORAL | Status: DC | PRN
  Administered 2023-07-01 – 2023-07-09 (×5): 25 mg via ORAL
  Filled 2023-07-01 (×5): qty 1

## 2023-07-01 MED ORDER — ACETAMINOPHEN 325 MG PO TABS
650.0000 mg | ORAL_TABLET | Freq: Four times a day (QID) | ORAL | Status: DC | PRN
  Administered 2023-07-02 – 2023-07-11 (×6): 650 mg via ORAL
  Filled 2023-07-01 (×6): qty 2

## 2023-07-01 MED ORDER — ALUM & MAG HYDROXIDE-SIMETH 200-200-20 MG/5ML PO SUSP: 30.0000 mL | ORAL | Status: DC | PRN

## 2023-07-01 MED ORDER — DIPHENHYDRAMINE HCL 50 MG/ML IJ SOLN
50.0000 mg | Freq: Three times a day (TID) | INTRAMUSCULAR | Status: DC | PRN
  Administered 2023-07-10: 50 mg via INTRAMUSCULAR
  Filled 2023-07-01: qty 1

## 2023-07-01 MED ORDER — QUETIAPINE FUMARATE 100 MG PO TABS
100.0000 mg | ORAL_TABLET | Freq: Every day | ORAL | Status: DC
  Administered 2023-07-01: 100 mg via ORAL
  Filled 2023-07-01 (×4): qty 1

## 2023-07-01 NOTE — Plan of Care (Signed)
  Problem: Safety: Goal: Periods of time without injury will increase Outcome: Progressing   Problem: Coping: Goal: Ability to verbalize frustrations and anger appropriately will improve Outcome: Progressing   Problem: Activity: Goal: Will verbalize the importance of balancing activity with adequate rest periods Outcome: Progressing

## 2023-07-01 NOTE — Group Note (Signed)
 Date:  07/01/2023 Time:  8:41 PM  Group Topic/Focus:  Wrap-Up Group:   The focus of this group is to help patients review their daily goal of treatment and discuss progress on daily workbooks.    Participation Level:  Did Not Attend  Carly Brooks 07/01/2023, 8:41 PM

## 2023-07-01 NOTE — Progress Notes (Signed)
   07/01/23 2000  Psych Admission Type (Psych Patients Only)  Admission Status Voluntary  Psychosocial Assessment  Patient Complaints Anxiety;Depression  Eye Contact Fair  Facial Expression Anxious  Affect Appropriate to circumstance  Speech Logical/coherent  Interaction Guarded;Minimal;No initiation  Motor Activity Other (Comment) (WNL)  Appearance/Hygiene Unremarkable  Behavior Characteristics Appropriate to situation;Cooperative  Mood Labile  Thought Process  Coherency Disorganized  Content Paranoia;Preoccupation;Delusions  Delusions Paranoid  Perception Derealization  Hallucination None reported or observed  Judgment Impaired  Confusion Mild  Danger to Self  Current suicidal ideation? Denies  Description of Suicide Plan None  Agreement Not to Harm Self Yes  Description of Agreement Verbal contract for safety  Danger to Others  Danger to Others None reported or observed

## 2023-07-01 NOTE — ED Notes (Signed)
 Patient report was called to [place], Charity fundraiser at [facility]

## 2023-07-01 NOTE — Plan of Care (Signed)
  Problem: Education: Goal: Knowledge of [facility] Education information/materials will improve 07/01/2023 2055 by Juanda Noon, RN Outcome: Progressing 07/01/2023 2055 by Juanda Noon, RN Outcome: Progressing Goal: Emotional status will improve 07/01/2023 2055 by Juanda Noon, RN Outcome: Progressing 07/01/2023 2055 by Juanda Noon, RN Outcome: Progressing Goal: Verbalization of understanding the information provided will improve 07/01/2023 2055 by Juanda Noon, RN Outcome: Progressing 07/01/2023 2055 by Juanda Noon, RN Outcome: Progressing   Problem: Education: Goal: Mental status will improve 07/01/2023 2055 by Juanda Noon, RN Outcome: Not Progressing 07/01/2023 2055 by Juanda Noon, RN Outcome: Progressing
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
## 2023-07-01 NOTE — ED Provider Notes (Signed)
 Emergency Medicine Observation Re-evaluation Note  Carly Brooks is a 28 y.o. female, seen on rounds today.  Pt initially presented to the ED for complaints of Psychiatric Evaluation Currently, the patient is asleep, no new concerns by nursing staff.  Physical Exam  BP 128/88 (BP Location: Right Arm)   Pulse 74   Temp 98.2 F (36.8 C) (Oral)   Resp 16   Ht 5\' 9"  (1.753 m)   Wt 127 kg   LMP 06/01/2023   SpO2 100%   BMI 41.35 kg/m  Physical Exam General: Asleep, no acute distress Cardiac: Regular rate Lungs: No increased work of breathing Psych: Calm, sleep  ED Course / MDM  EKG:   I have reviewed the labs performed to date as well as medications administered while in observation.  Recent changes in the last 24 hours include patient is medically cleared.  She was evaluated by psych and recommended for inpatient.  Plan  Current plan is for inpatient psych.    Kingsley, Peggy Loge K, DO 07/01/23 [phone]

## 2023-07-02 ENCOUNTER — Encounter (HOSPITAL_COMMUNITY): Payer: Self-pay

## 2023-07-02 DIAGNOSIS — F209 Schizophrenia, unspecified: Principal | ICD-10-CM

## 2023-07-02 DIAGNOSIS — F12259 Cannabis dependence with psychotic disorder, unspecified: Secondary | ICD-10-CM | POA: Diagnosis not present

## 2023-07-02 MED ORDER — RISPERIDONE 1 MG PO TABS
1.0000 mg | ORAL_TABLET | Freq: Two times a day (BID) | ORAL | Status: DC
  Administered 2023-07-02: 1 mg via ORAL
  Filled 2023-07-02 (×3): qty 1

## 2023-07-02 MED ORDER — TRAZODONE HCL 100 MG PO TABS
100.0000 mg | ORAL_TABLET | Freq: Every evening | ORAL | Status: DC | PRN
Start: 2023-07-03 — End: 2023-07-03

## 2023-07-02 MED ORDER — TRAZODONE HCL 50 MG PO TABS
50.0000 mg | ORAL_TABLET | Freq: Once | ORAL | Status: DC
  Filled 2023-07-02: qty 1

## 2023-07-02 MED ORDER — RISPERIDONE 1 MG PO TABS
1.0000 mg | ORAL_TABLET | Freq: Every day | ORAL | Status: DC
  Administered 2023-07-03: 1 mg via ORAL
  Filled 2023-07-02 (×2): qty 1

## 2023-07-02 MED ORDER — RISPERIDONE 1 MG PO TABS
1.0000 mg | ORAL_TABLET | Freq: Every day | ORAL | Status: DC
  Administered 2023-07-03 – 2023-07-05 (×3): 1 mg via ORAL
  Filled 2023-07-02 (×5): qty 1

## 2023-07-02 MED ORDER — POLYETHYLENE GLYCOL 3350 17 G PO PACK
17.0000 g | PACK | Freq: Every day | ORAL | Status: DC
  Administered 2023-07-03 – 2023-07-11 (×7): 17 g via ORAL
  Filled 2023-07-02 (×13): qty 1
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
## 2023-07-02 NOTE — Group Note (Signed)
 Recreation Therapy Group Note   Group Topic:Coping Skills  Group Date: 07/02/2023 Start Time: 1020 End Time: 1050 Facilitators: Roxanne Panek-McCall, LRT,CTRS Location: [address] Dayroom   Group Topic: Coping Skills   Goal Area(s) Addresses: Patient will define what a coping skill is. Patient will create a list of healthy coping skills beginning with each letter of the alphabet. Patient will successfully identify positive coping skills they can use post d/c.  Patient will acknowledge benefit(s) of using learned coping skills post d/c.  Intervention: Group work   Activity: Coping A to Z. Patient asked to identify what a coping skill is and when they use them. Patients with Clinical research associate discussed healthy versus unhealthy coping skills. Next patients were given a blank worksheet titled "Coping Skills A-Z". Patients were instructed to come up with at least one positive coping skill per letter of the alphabet, addressing a specific challenge (ex: stress, anger, anxiety, depression, grief, doubt, isolation, self-harm/suicidal thoughts, substance use). Patients were given 15 minutes to brainstorm, before ideas were presented to the large group. Patients and LRT debriefed on the importance of coping skill selection based on situation and back-up plans when a skill tried is not effective. At the end of group, patients were given an handout of alphabetized strategies to keep for future reference.   Education: Pharmacologist, Scientist, physiological, Discharge Planning.    Education Outcome: Acknowledges education/Verbalizes understanding/In group clarification offered/Additional education needed   Affect/Mood: N/A   Participation Level: Did not attend    Clinical Observations/Individualized Feedback:     Plan: Continue to engage patient in RT group sessions 2-3x/week.   Denard Tuminello-McCall, LRT,CTRS 07/02/2023 2:06 PM

## 2023-07-02 NOTE — Plan of Care (Signed)
   Problem: Education: Goal: Emotional status will improve Outcome: Not Progressing Goal: Mental status will improve Outcome: Not Progressing

## 2023-07-02 NOTE — Group Note (Signed)
 Date:  07/02/2023 Time:  8:33 PM  Group Topic/Focus:  Wrap-Up Group:   The focus of this group is to help patients review their daily goal of treatment and discuss progress on daily workbooks.    Participation Level:  Did Not Attend  Aime Meloche Dacosta 07/02/2023, 8:33 PM
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
## 2023-07-02 NOTE — BH IP Treatment Plan (Signed)
 Interdisciplinary Treatment and Diagnostic Plan Update  07/02/2023 Time of Session: 1233 Carly Brooks MRN: [id number]  Principal Diagnosis: Schizophrenia [facility])  Secondary Diagnoses: Principal Problem:   Schizophrenia (HCC)   Current Medications:  Current Facility-Administered Medications  Medication Dose Route Frequency Provider Last Rate Last Admin   acetaminophen (TYLENOL) tablet 650 mg  650 mg Oral Q6H PRN Motley-Mangrum, Jadeka A, PMHNP       alum & mag hydroxide-simeth (MAALOX/MYLANTA) 200-200-20 MG/5ML suspension 30 mL  30 mL Oral Q4H PRN Motley-Mangrum, Jadeka A, PMHNP       haloperidol (HALDOL) tablet 5 mg  5 mg Oral TID PRN Motley-Mangrum, Jadeka A, PMHNP       And   diphenhydrAMINE (BENADRYL) capsule 50 mg  50 mg Oral TID PRN Motley-Mangrum, Jadeka A, PMHNP       haloperidol lactate (HALDOL) injection 5 mg  5 mg Intramuscular TID PRN Motley-Mangrum, Jadeka A, PMHNP       And   diphenhydrAMINE (BENADRYL) injection 50 mg  50 mg Intramuscular TID PRN Motley-Mangrum, Jadeka A, PMHNP       And   LORazepam (ATIVAN) injection 2 mg  2 mg Intramuscular TID PRN Motley-Mangrum, Jadeka A, PMHNP       haloperidol lactate (HALDOL) injection 10 mg  10 mg Intramuscular TID PRN Motley-Mangrum, Jadeka A, PMHNP       And   diphenhydrAMINE (BENADRYL) injection 50 mg  50 mg Intramuscular TID PRN Motley-Mangrum, Jadeka A, PMHNP       And   LORazepam (ATIVAN) injection 2 mg  2 mg Intramuscular TID PRN Motley-Mangrum, Jadeka A, PMHNP       hydrOXYzine (ATARAX) tablet 25 mg  25 mg Oral TID PRN Motley-Mangrum, Jadeka A, PMHNP   25 mg at 07/01/23 2111   magnesium hydroxide (MILK OF MAGNESIA) suspension 30 mL  30 mL Oral Daily PRN Motley-Mangrum, Jadeka A, PMHNP       QUEtiapine (SEROQUEL) tablet 100 mg  100 mg Oral QHS Motley-Mangrum, Jadeka A, PMHNP   100 mg at 07/01/23 2111   traZODone (DESYREL) tablet 50 mg  50 mg Oral QHS PRN Motley-Mangrum, Jadeka A, PMHNP   50 mg at 07/01/23 2110    PTA Medications: Medications Prior to Admission  Medication Sig Dispense Refill Last Dose/Taking   ARIPiprazole (ABILIFY) 20 MG tablet Take 20 mg by mouth daily.   07/01/2023   QUEtiapine (SEROQUEL) 200 MG tablet Take 200 mg by mouth at bedtime.   07/01/2023 Bedtime    Patient Stressors: Financial difficulties   Substance abuse   Traumatic event    Patient Strengths: Capable of independent living  Education administrator  Physical Health  Supportive family/friends   Treatment Modalities: Medication Management, Group therapy, Case management,  1 to 1 session with clinician, Psychoeducation, Recreational therapy.   Physician Treatment Plan for Primary Diagnosis: Schizophrenia (HCC) Long Term Goal(s):     Short Term Goals:    Medication Management: Evaluate patient's response, side effects, and tolerance of medication regimen.  Therapeutic Interventions: 1 to 1 sessions, Unit Group sessions and Medication administration.  Evaluation of Outcomes: Not Progressing  Physician Treatment Plan for Secondary Diagnosis: Principal Problem:   Schizophrenia (HCC)  Long Term Goal(s):     Short Term Goals:       Medication Management: Evaluate patient's response, side effects, and tolerance of medication regimen.  Therapeutic Interventions: 1 to 1 sessions, Unit Group sessions and Medication administration.  Evaluation of Outcomes: Not Progressing   RN  Treatment Plan for Primary Diagnosis: Schizophrenia (HCC) Long Term Goal(s): Knowledge of disease and therapeutic regimen to maintain health will improve  Short Term Goals: Ability to remain free from injury will improve, Ability to verbalize frustration and anger appropriately will improve, Ability to demonstrate self-control, Ability to participate in decision making will improve, and Ability to verbalize feelings will improve  Medication Management: RN will administer medications as ordered by provider, will assess  and evaluate patient's response and provide education to patient for prescribed medication. RN will report any adverse and/or side effects to prescribing provider.  Therapeutic Interventions: 1 on 1 counseling sessions, Psychoeducation, Medication administration, Evaluate responses to treatment, Monitor vital signs and CBGs as ordered, Perform/monitor CIWA, COWS, AIMS and Fall Risk screenings as ordered, Perform wound care treatments as ordered.  Evaluation of Outcomes: Not Progressing   LCSW Treatment Plan for Primary Diagnosis: Schizophrenia (HCC) Long Term Goal(s): Safe transition to appropriate next level of care at discharge, Engage patient in therapeutic group addressing interpersonal concerns.  Short Term Goals: Engage patient in aftercare planning with referrals and resources, Increase social support, Increase ability to appropriately verbalize feelings, Increase emotional regulation, Facilitate acceptance of mental health diagnosis and concerns, and Increase skills for wellness and recovery  Therapeutic Interventions: Assess for all discharge needs, 1 to 1 time with Social worker, Explore available resources and support systems, Assess for adequacy in community support network, Educate family and significant other(s) on suicide prevention, Complete Psychosocial Assessment, Interpersonal group therapy.  Evaluation of Outcomes: Not Progressing   Progress in Treatment: Attending groups: No. Participating in groups: No. Taking medication as prescribed: Yes. Toleration medication: Yes. Family/Significant other contact made: No, will contact:  "Mr. [name]" Patient understands diagnosis: Yes. Discussing patient identified problems/goals with staff: Yes. Medical problems stabilized or resolved: Yes. Denies suicidal/homicidal ideation: Yes. Issues/concerns per patient self-inventory: No. Other: N/a  New problem(s) identified: No, Describe:  None  New Short Term/Long Term Goal(s):  medication stabilization, elimination of SI thoughts, development of comprehensive mental wellness plan.   Patient Goals: "I'm losing my eye sight"  Discharge Plan or Barriers: Patient recently admitted. CSW will continue to follow and assess for appropriate referrals and possible discharge planning.   Reason for Continuation of Hospitalization: Medication stabilization Other; describe Mood stabilization, discharge planning  Estimated Length of Stay: 5-7 DAYS  Last 3 [place] Suicide Severity Risk Score: Flowsheet Row Ad[facility] (Current) [facility] from [facility]30/2023 in [facility] ED from 06/09/2023 in [facility]  C-SSRS RISK CATEGORY No Risk No Risk No Risk       Last [facility] 2/9 Scores:    05/23/2023   10:48 AM 03/07/2023    4:33 PM 01/22/2023    3:41 PM  Depression screen PHQ 2/9  Decreased Interest 0 2 2  Down, Depressed, Hopeless 0 2 2  PHQ - 2 Score 0 4 4  Altered sleeping 0 2 2  Tired, decreased energy 0 2 2  Change in appetite 0 2 2  Feeling bad or failure about yourself  0 2 2  Trouble concentrating 1 0 2  Moving slowly or fidgety/restless 0 0 0  Suicidal thoughts 0 0 0  PHQ-9 Score 1 12 14   Difficult doing work/chores Not difficult at all Very difficult Somewhat difficult    Scribe for Treatment Team: Jacinta Shoe, LCSW 07/02/2023 5:32 PM

## 2023-07-02 NOTE — Progress Notes (Signed)
 Recreation Therapy Notes  INPATIENT RECREATION THERAPY ASSESSMENT  Patient Details Name: Carly Brooks MRN: [id number] DOB: Mar 16, 1996 Today's Date: 07/02/2023       Information Obtained From: Patient  Able to Participate in Assessment/Interview: Yes  Patient Presentation: Alert  Reason for Admission (Per Patient): Other (Comments) ("I don't know, an emergency crisis" "they think I'm crazy")  Patient Stressors: Other (Comment) (dogs barking)  Coping Skills:   Isolation, Journal, TV, Music, Exercise, Meditate, Deep Breathing, Prayer, Talk, Impulsivity, Substance Abuse, Avoidance, Read, Dance, Hot Bath/Shower (Marijuana, Alcohol)  Leisure Interests (2+):  Crafts - Knitting/Crocheting, Lonne Roan - Other (Comment) (Learn crocheting and nature)  Frequency of Recreation/Participation: Weekly  Awareness of Community Resources:  Yes  Community Resources:  Public affairs consultant, Research scientist (physical sciences), Tree surgeon  Current Use: Yes  If no, Barriers?:    Expressed Interest in [employer] Information: No  [employer] of Residence:  Engineer, technical sales  Patient Main Form of Transportation: Other (Comment) ("father")  Patient Strengths:  Listening  Patient Identified Areas of Improvement:  "eye sight, understanding"  Patient Goal for Hospitalization:  "make sure I don't have to come back, self improvement"  Current SI (including self-harm):  No  Current HI:  No  Current AVH: No  Staff Intervention Plan: Group Attendance, Collaborate with Interdisciplinary Treatment Team  Consent to Intern Participation: N/A   Kong Packett-McCall, LRT,CTRS Elynn Patteson A Danilyn Cocke-McCall 07/02/2023, 2:45 PM

## 2023-07-02 NOTE — Group Note (Signed)
 LCSW Group Therapy Note   Group Date: 07/02/2023 Start Time: 1100 End Time: 1200  Participation:  patient was present and actively participated in the discussion.     Type of Therapy:  Group Therapy  Topic:  Stronger Together:  Building Health Relationships  Objective:  to explore loneliness, boundaries, and safe ways to build relationships.  Goals: Recognize healthy vs. unhealthy relationships. Learn safe ways to connect with others. Strengthen communication and [employer].  Summary:  Participants discussed loneliness, healthy connections, and setting boundaries. They explored safe ways to meet people and shared personal experiences. Key insights were reinforced through discussion and quotes.  Therapeutic Modalities Used: Cognitive Behavioral Therapy (CBT) Elements - Identifying unhealthy relationship patterns, challenging negative thoughts about connection. Dialectical Behavior Therapy (DBT) Elements - Interpersonal effectiveness, setting and maintaining boundaries. Supportive Group Therapy - Peer discussion, shared experiences, and emotional validation.   Ravin Bendall O Iva Posten, LCSWA 07/02/2023  5:17 PM
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
## 2023-07-02 NOTE — Progress Notes (Signed)
 DAR NOTE: Patient presents with anxious affect and paranoid behaviors.  Patient walked into the dayroom guarded and suspicious.  Refused to go to [employer] when reminded.  Patient came out of her room with only her underwear on.  Patient was redirected back to her room where she was encouraged to put her clothes on but refused. Maintained on routine safety checks.  Support and encouragement offered as needed.   Offered no complaint.

## 2023-07-02 NOTE — [facility] Group Notes (Signed)
 Adult Psychoeducational Group Note  Date:  07/02/2023 Time:  9:59 AM  Group Topic/Focus:  Goals Group:   The focus of this group is to help patients establish daily goals to achieve during treatment and discuss how the patient can incorporate goal setting into their daily lives to aide in recovery.  Participation Level:  Did Not Attend  Participation Quality:  na  Affect:  na  Cognitive:  na  Insight: na  Engagement in Group:  na  Modes of Intervention:  na  Additional Comments:  Pt did not attend group  Alean Amen 07/02/2023, 9:59 AM

## 2023-07-03 DIAGNOSIS — F12259 Cannabis dependence with psychotic disorder, unspecified: Secondary | ICD-10-CM

## 2023-07-03 DIAGNOSIS — F200 Paranoid schizophrenia: Secondary | ICD-10-CM

## 2023-07-03 LAB — CBC WITH DIFFERENTIAL/PLATELET
Abs Immature Granulocytes: 0.01 10*3/uL (ref 0.00–0.07)
Basophils Absolute: 0.1 10*3/uL (ref 0.0–0.1)
Basophils Relative: 1 %
Eosinophils Absolute: 0.1 10*3/uL (ref 0.0–0.5)
Eosinophils Relative: 2 %
HCT: 41.7 % (ref 36.0–46.0)
Hemoglobin: 12.8 g/dL (ref 12.0–15.0)
Immature Granulocytes: 0 %
Lymphocytes Relative: 34 %
Lymphs Abs: 2 10*3/uL (ref 0.7–4.0)
MCH: 26 pg (ref 26.0–34.0)
MCHC: 30.7 g/dL (ref 30.0–36.0)
MCV: 84.6 fL (ref 80.0–100.0)
Monocytes Absolute: 0.5 10*3/uL (ref 0.1–1.0)
Monocytes Relative: 9 %
Neutro Abs: 3.1 10*3/uL (ref 1.7–7.7)
Neutrophils Relative %: 54 %
Platelets: 363 10*3/uL (ref 150–400)
RBC: 4.93 MIL/uL (ref 3.87–5.11)
RDW: 15.4 % (ref 11.5–15.5)
WBC: 5.8 10*3/uL (ref 4.0–10.5)
nRBC: 0 % (ref 0.0–0.2)

## 2023-07-03 LAB — COMPREHENSIVE METABOLIC PANEL WITH GFR
ALT: 86 U/L — ABNORMAL HIGH (ref 0–44)
AST: 50 U/L — ABNORMAL HIGH (ref 15–41)
Albumin: 4 g/dL (ref 3.5–5.0)
Alkaline Phosphatase: 114 U/L (ref 38–126)
Anion gap: 11 (ref 5–15)
BUN: 6 mg/dL (ref 6–20)
CO2: 21 mmol/L — ABNORMAL LOW (ref 22–32)
Calcium: 9.5 mg/dL (ref 8.9–10.3)
Chloride: 105 mmol/L (ref 98–111)
Creatinine, Ser: 0.75 mg/dL (ref 0.44–1.00)
GFR, Estimated: >60 mL/min (ref >60)
Glucose, Bld: 97 mg/dL (ref 70–99)
Potassium: 3.6 mmol/L (ref 3.5–5.1)
Sodium: 137 mmol/L (ref 135–145)
Total Bilirubin: 0.8 mg/dL (ref 0.0–1.2)
Total Protein: 7.9 g/dL (ref 6.5–8.1)

## 2023-07-03 LAB — FOLATE: Folate: 14.1 ng/mL (ref >5.9)

## 2023-07-03 LAB — VITAMIN B12: Vitamin B-12: 409 pg/mL (ref 180–914)

## 2023-07-03 MED ORDER — TRAZODONE HCL 50 MG PO TABS
50.0000 mg | ORAL_TABLET | Freq: Every day | ORAL | Status: DC
  Administered 2023-07-03 – 2023-07-09 (×7): 50 mg via ORAL
  Filled 2023-07-03 (×10): qty 1

## 2023-07-03 NOTE — Group Note (Signed)
 Recreation Therapy Group Note   Group Topic:Self-Esteem  Group Date: 07/03/2023 Start Time: 1010 End Time: 1030 Facilitators: Cartrell Bentsen-McCall, LRT,CTRS Location: [address] Dayroom   Group Topic: Self Esteem    Goal Area(s) Addresses:  Patient will appropriately identify what self esteem is.  Patient will create a shield of armor describing themselves.  Patient will successfully identify positive attributes about themselves.  Patient will acknowledge benefit of improved self-esteem.    Intervention / Activity: Self-Esteem Shield. Patient attended a recreation therapy group session focused on self esteem. Patient identified what self esteem is, and why it is important to have high self esteem during group discussion. Patient was asked to show off their unique attributes, the four quadrants reflected the following:   The Upper Left quadrant- 2 things or people you value The Upper Right quadrant- 2 lessons you've learned thus far in life The Lower Left quadrant- 3 qualities that make you unique The Lower Right quadrant- 1 goal you want to work towards    Patients were provided sheets with the shield printed on them and colored pencils, markers and crayons to complete the activity.  Patients and writer had group related discussions while individually working on their activity.  Patients were debriefed on the importance of healthy self esteem and offered a handout for ways to increase self esteem.   Education: Self esteem, Communication, Positive self-talk, Discharge Planning    Education Outcome: Acknowledges education/Verbalizes understanding of Education/In group clarification offered/Needs further education   Affect/Mood: Appropriate   Participation Level: Moderate   Participation Quality: Independent   Behavior: Appropriate   Speech/Thought Process: Focused   Insight: Good   Judgement: Good   Modes of Intervention: Art   Patient Response to Interventions:   Receptive   Education Outcome:  In group clarification offered    Clinical Observations/Individualized Feedback: Pt arrived late to group. Pt did identify self esteem as "self care/self love". Pt was quiet during group but interacted when prompted. Pt was able to label shield as follows:  Upper Left Quadrant: sunny days and squirrels Upper Right Quadrant: stay true to yourself, we will know when we get there, present moment Lower Left Quadrant: being kind, her hair and family Lower Right Quadrant: "feeling comfortable and free; always uniquely me"   Pt filled out her shield, turned it in, left and didn't return.   Plan: Continue to engage patient in RT group sessions 2-3x/week.   Conan Mcmanaway-McCall, LRT,CTRS  07/03/2023 12:32 PM

## 2023-07-03 NOTE — Plan of Care (Signed)
  Problem: Education: Goal: Knowledge of [facility] Education information/materials will improve 07/03/2023 0512 by Una Ganser, RN Outcome: Progressing 07/03/2023 0452 by Una Ganser, RN Outcome: Progressing Goal: Emotional status will improve 07/03/2023 0512 by Una Ganser, RN Outcome: Progressing 07/03/2023 0452 by Una Ganser, RN Outcome: Progressing Goal: Mental status will improve 07/03/2023 0512 by Una Ganser, RN Outcome: Progressing 07/03/2023 0452 by Una Ganser, RN Outcome: Progressing Goal: Verbalization of understanding the information provided will improve 07/03/2023 0512 by Una Ganser, RN Outcome: Progressing 07/03/2023 0452 by Una Ganser, RN Outcome: Progressing

## 2023-07-03 NOTE — Plan of Care (Signed)
   Problem: Education: Goal: Emotional status will improve Outcome: Not Progressing Goal: Mental status will improve Outcome: Not Progressing Goal: Verbalization of understanding the information provided will improve Outcome: Not Progressing

## 2023-07-03 NOTE — Progress Notes (Addendum)
 Collateral contact - Mr. Nolon Baxter (dad) [phone]   2:15 PM - CSW left a voicemail.     Delrico Minehart, LCSWA 07/03/2023

## 2023-07-03 NOTE — Progress Notes (Signed)
 [facility] MD Progress Note  07/03/2023 1:38 PM Carly Brooks  MRN:  [id number]   Reason for Admission:  28 y.o., female patient with PPH of unspecified psychosis with paranoia and delusional thinking as well as cannabis use disorder who presented to ED again with paranoid seeking inpatient psychiatric admission. The patient is currently on Hospital Day 2.   Chart Review from last 24 hours:  The patient's chart was reviewed and nursing notes were reviewed. The patient's case was discussed in multidisciplinary team meeting. Per [facility] patient is compliant with medications on the unit, as needed Haldol for mild agitation as well as Benadryl were given/15 at 1 PM, as needed Atarax was given/13.  Information Obtained Today During Patient Interview: Staff report patient having episodes of coming out of her room naked last night with bizarre behavior but redirectable.  Upon evaluation today patient presents with thought blocking flat affect occasionally disorganized but concrete in general reporting doing well and denying any SI HI or AVH but admits to feeling depressed and anxious with feeling hopelessness and helplessness.  Denies paranoia or other delusions but as noted presents with thought blocking and some disorganized thought process.  She reports poor sleep with nightmares noted, reports good appetite.    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)    Past Psychiatric History: See H&P  Past Medical History:  Past Medical History:  Diagnosis Date   Anxiety    Cluster B personality disorder (HCC)    Depression    Medical history non-contributory     Past Surgical History:  Procedure Laterality Date   CESAREAN SECTION     CESAREAN SECTION  04/18/2021   CESAREAN SECTION WITH BILATERAL TUBAL LIGATION N/A 04/18/2021   Procedure: CESAREAN  SECTION   Family History:  Family History  Problem Relation Age of Onset   Diabetes Mother    Family Psychiatric  History: See H&P Social History: See H&P   Current Medications: Current Facility-Administered Medications  Medication Dose Route Frequency Provider Last Rate Last Admin   acetaminophen (TYLENOL) tablet 650 mg  650 mg Oral Q6H PRN Motley-Mangrum, Jadeka A, PMHNP   650 mg at 07/03/23 1307   alum & mag hydroxide-simeth (MAALOX/MYLANTA) 200-200-20 MG/5ML suspension 30 mL  30 mL Oral Q4H PRN Motley-Mangrum, Jadeka A, PMHNP       haloperidol (HALDOL) tablet 5 mg  5 mg Oral TID PRN Motley-Mangrum, Jadeka A, PMHNP   5 mg at 07/03/23 1307   And   diphenhydrAMINE (BENADRYL) capsule 50 mg  50 mg Oral TID PRN Motley-Mangrum, Jadeka A, PMHNP   50 mg at 07/03/23 1307   haloperidol lactate (HALDOL) injection 5 mg  5 mg Intramuscular TID PRN Motley-Mangrum, Jadeka A, PMHNP       And   diphenhydrAMINE (BENADRYL) injection 50 mg  50 mg Intramuscular TID PRN Motley-Mangrum, Jadeka A, PMHNP       And   LORazepam (ATIVAN) injection 2 mg  2 mg Intramuscular TID PRN Motley-Mangrum, Jadeka A, PMHNP       haloperidol lactate (HALDOL) injection 10 mg  10 mg Intramuscular TID PRN Motley-Mangrum, Jadeka A, PMHNP       And   diphenhydrAMINE (BENADRYL) injection 50 mg  50 mg Intramuscular TID PRN Motley-Mangrum, Jadeka A, PMHNP       And   LORazepam (ATIVAN) injection 2 mg  2 mg Intramuscular TID PRN Motley-Mangrum,  Jadeka A, PMHNP       hydrOXYzine (ATARAX) tablet 25 mg  25 mg Oral TID PRN Motley-Mangrum, Jadeka A, PMHNP   25 mg at 07/01/23 2111   magnesium hydroxide (MILK OF MAGNESIA) suspension 30 mL  30 mL Oral Daily PRN Motley-Mangrum, Jadeka A, PMHNP       polyethylene glycol (MIRALAX / GLYCOLAX) packet 17 g  17 g Oral Daily Zouev, Dmitri, MD   17 g at 07/03/23 0757   risperiDONE (RISPERDAL) tablet 1 mg  1 mg Oral Daily Zouev, Dmitri, MD   1 mg at 07/03/23 0757   risperiDONE (RISPERDAL) tablet 1  mg  1 mg Oral QHS Zouev, Dmitri, MD       traZODone (DESYREL) tablet 100 mg  100 mg Oral QHS PRN Zouev, Dmitri, MD       traZODone (DESYREL) tablet 50 mg  50 mg Oral Once Zouev, Dmitri, MD        Lab Results:  Results for orders placed or performed during the hospital encounter of 07/01/23 (from the past 48 hours)  CBC with Differential/Platelet     Status: None   Collection Time: 07/03/23  6:32 AM  Result Value Ref Range   WBC 5.8 4.0 - 10.5 K/uL   RBC 4.93 3.87 - 5.11 MIL/uL   Hemoglobin 12.8 12.0 - 15.0 g/dL   HCT 78.4 69.6 - 29.5 %   MCV 84.6 80.0 - 100.0 fL   MCH 26.0 26.0 - 34.0 pg   MCHC 30.7 30.0 - 36.0 g/dL   RDW 28.4 13.2 - 44.0 %   Platelets 363 150 - 400 K/uL   nRBC 0.0 0.0 - 0.2 %   Neutrophils Relative % 54 %   Neutro Abs 3.1 1.7 - 7.7 K/uL   Lymphocytes Relative 34 %   Lymphs Abs 2.0 0.7 - 4.0 K/uL   Monocytes Relative 9 %   Monocytes Absolute 0.5 0.1 - 1.0 K/uL   Eosinophils Relative 2 %   Eosinophils Absolute 0.1 0.0 - 0.5 K/uL   Basophils Relative 1 %   Basophils Absolute 0.1 0.0 - 0.1 K/uL   Immature Granulocytes 0 %   Abs Immature Granulocytes 0.01 0.00 - 0.07 K/uL    Comment: Performed at [facility], [address], [place], [place] [address]  Comprehensive metabolic panel     Status: Abnormal   Collection Time: 07/03/23  6:32 AM  Result Value Ref Range   Sodium 137 135 - 145 mmol/L   Potassium 3.6 3.5 - 5.1 mmol/L   Chloride 105 98 - 111 mmol/L   CO2 21 (L) 22 - 32 mmol/L   Glucose, Bld 97 70 - 99 mg/dL    Comment: Glucose reference range applies only to samples taken after fasting for at least 8 hours.   BUN 6 6 - 20 mg/dL   Creatinine, Ser 5.36 0.44 - 1.00 mg/dL   Calcium 9.5 8.9 - 64.4 mg/dL   Total Protein 7.9 6.5 - 8.1 g/dL   Albumin 4.0 3.5 - 5.0 g/dL   AST 50 (H) 15 - 41 U/L   ALT 86 (H) 0 - 44 U/L   Alkaline Phosphatase 114 38 - 126 U/L   Total Bilirubin 0.8 0.0 - 1.2 mg/dL   GFR, Estimated >03 >47 mL/min     Comment: (NOTE) Calculated using the CKD-EPI Creatinine Equation (2021)    Anion gap 11 5 - 15    Comment: Performed at [facility], [address] [place], [place] [address]  Vitamin B12     Status: None   Collection Time: 07/03/23  6:32 AM  Result Value Ref Range   Vitamin B-12 409 180 - 914 pg/mL    Comment: (NOTE) This assay is not validated for testing neonatal or myeloproliferative syndrome specimens for Vitamin B12 levels. Performed at [facility], [address], [place], [place] [address]   Folate     Status: None   Collection Time: 07/03/23  6:32 AM  Result Value Ref Range   Folate 14.1 >5.9 ng/mL    Comment: Performed at [facility], [address] [place], [place] [address]    Blood Alcohol level:  Lab Results  Component Value Date   ETH <10 06/30/2023   ETH <10 06/09/2023    Metabolic Disorder Labs: Lab Results  Component Value Date   HGBA1C 4.9 06/09/2023   MPG 93.93 06/09/2023   No results found for: "PROLACTIN" Lab Results  Component Value Date   CHOL 154 06/09/2023   TRIG 51 06/09/2023   HDL 43 06/09/2023   CHOLHDL 3.6 06/09/2023   VLDL 10 06/09/2023   LDLCALC 101 (H) 06/09/2023    Physical Findings: AIMS:  , ,  ,  ,    CIWA:    COWS:     Musculoskeletal: Strength & Muscle Tone: within normal limits Gait & Station: normal Patient leans: N/A  Psychiatric Specialty Exam:  General Appearance: Appears a stated age, obese, dressed in hospital gown  Behavior: Guarded  Psychomotor Activity:No psychomotor agitation but mild to moderate retardation noted  Eye Contact: Limited Speech: Decreased amount, decreased tone and volume   Mood: Dysphoric Affect: Flat  Thought Process: Occasionally disorganized, thought blocking Descriptions of Associations: Very concrete Thought Content: Hallucinations: Denies AH, VH  Delusions: Denies paranoia but presents guarded and  paranoid Suicidal Thoughts: Denies passive or active SI, intention, plan  Homicidal Thoughts: Denies HI, intention, plan   Alertness/Orientation: Alert and partially oriented  Insight: Limited Judgment: Limited  Memory: Limited  Executive Functions  Concentration: Limited Attention Span: Limited Recall: Limited Fund of Knowledge: Limited   Assets  Assets: Resilience Access to care   Physical Exam: Physical Exam Vitals and nursing note reviewed.    Review of Systems  All other systems reviewed and are negative.  Blood pressure 122/83, pulse 91, temperature 98.3 F (36.8 C), temperature source Oral, resp. rate 17, height 5\' 9"  (1.753 m), weight 120.6 kg, last menstrual period 06/01/2023, SpO2 99%, unknown if currently breastfeeding. Body mass index is 39.25 kg/m.   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 Medications:   Continue risperidone 1 mg twice daily for psychosis with plan to titrated tomorrow night to 2 mg at bedtime to address psychosis  Consider LAI to improve long-term compliance and decrease risk of decompensation  Trazodone  50 mg at bedtime for sleep  Continue as needed Atarax for anxiety  Continue agitation protocol as  needed --  The risks/benefits/side-effects/alternatives to this medication were discussed in detail with the patient and time was given for questions. The patient consents to medication trial.    -- Metabolic profile and EKG monitoring obtained while on an atypical antipsychotic (BMI: Lipid Panel: HbgA1c: QTc:)      3. Pertinent labs: Labwork reviewed      Lab ordered: None   4. Group and Therapy: -- Encouraged patient to participate in unit milieu and in scheduled group therapies   Patient was counseled regarding need to abstain from marijuana after discharge, she agrees.  -- Short Term Goals: Ability to identify changes in lifestyle to reduce recurrence of condition will improve, Ability to verbalize feelings will improve, Ability to disclose and discuss suicidal ideas, Ability to demonstrate self-control will improve, and Ability to identify and develop effective coping behaviors will improve  -- Long Term Goals: Improvement in symptoms so as ready for discharge  6. Discharge Planning:   -- Social work and case management to assist with discharge planning and identification of hospital follow-up needs prior to discharge  -- Estimated LOS: 5-7 days  -- Discharge Concerns: Need to establish a safety plan; Medication compliance and effectiveness  -- Discharge Goals: Return home with outpatient referrals for mental health follow-up including medication management/psychotherapy      Total Time Spent in Direct Patient Care:  I personally spent 35 minutes on the unit in direct patient care. The direct patient care time included face-to-face time with the patient, reviewing the patient's chart, communicating with other professionals, and coordinating care. Greater than 50% of this time was spent in counseling or coordinating care with the patient regarding goals of hospitalization, psycho-education, and discharge planning needs.   Devyne Hauger Linnie Riches, MD 07/03/2023, 1:38 PM

## 2023-07-03 NOTE — Progress Notes (Signed)
 Patient repeatedly coming out of her room naked and and pressing the call bell yelling, "Someone coming to give me my injection?" Patient verbally aggressive with staff. PO agitation protocol offered and accepted. No adverse reactions noted. Patient remains safe at this time.

## 2023-07-03 NOTE — Progress Notes (Signed)
 I met with Carly Brooks to provide emotional and spiritual support around this hospitalization and some of the stressors in her life.  She shared that it is difficult for her to be here because she is thinking about her family and is missing them.  She expressed some helplessness and hopelessness about her situation.  I asked if it would be helpful to have some chaplain support during her time in the hospital and she agreed.  I will plan to follow up later in the week.

## 2023-07-03 NOTE — Group Note (Signed)
 Date:  07/03/2023 Time:  9:44 PM  Group Topic/Focus:  Wrap-Up Group:   The focus of this group is to help patients review their daily goal of treatment and discuss progress on daily workbooks.    Participation Level:  Active  Participation Quality:  Appropriate  Affect:  Appropriate  Cognitive:  Appropriate  Insight: Appropriate  Engagement in Group:  Engaged  Modes of Intervention:  Education and Exploration  Additional Comments:  Patient attended and participated in group tonight.  She enjoyed drinking ice cold water and the support time of staff members here.  Eugena Herter Dacosta 07/03/2023, 9:44 PM

## 2023-07-03 NOTE — Progress Notes (Signed)

## 2023-07-04 ENCOUNTER — Telehealth (HOSPITAL_COMMUNITY): Payer: Self-pay | Admitting: Pharmacy Technician

## 2023-07-04 ENCOUNTER — Other Ambulatory Visit (HOSPITAL_COMMUNITY): Payer: Self-pay

## 2023-07-04 DIAGNOSIS — F200 Paranoid schizophrenia: Secondary | ICD-10-CM | POA: Diagnosis not present

## 2023-07-04 DIAGNOSIS — F12259 Cannabis dependence with psychotic disorder, unspecified: Secondary | ICD-10-CM | POA: Diagnosis not present

## 2023-07-04 MED ORDER — RISPERIDONE 2 MG PO TABS
2.0000 mg | ORAL_TABLET | Freq: Every day | ORAL | Status: DC
  Administered 2023-07-04 – 2023-07-06 (×3): 2 mg via ORAL
  Filled 2023-07-04 (×4): qty 1

## 2023-07-04 NOTE — Plan of Care (Addendum)
 Pt  A & O to self, place and situation. Denies SI, HI, AVH and pain when assessed. Animated, logical at intervals with brief period of irritability related to peer pacing hall. Out of room naked this evening, laughing when redirections attempted by staff "I do this at home too. I even wave to my neighbors like this". Pt did not attend scheduled groups despite multiple prompts. Off unit for meals, returned without issues. PRN Tylenol given at 1737 for c/o of left shoulder pain 5/10 "She gave me a shot there, it still hurts". Reported relief when reassessed at 1820.  Remains medication compliant without adverse drug reactions. Safety checks maintained at Q 15 minutes intervals without outburst. Support, encouragement and reassurance offered.   Problem: Physical Regulation: Goal: Ability to maintain clinical measurements within normal limits will improve Outcome: Progressing   Problem: Safety: Goal: Ability to remain free from injury will improve Outcome: Progressing

## 2023-07-04 NOTE — Progress Notes (Signed)
 [facility] MD Progress Note  07/04/2023 1:22 PM Iviona Hole  MRN:  [id number]   Reason for Admission:  28 y.o., female patient with PPH of unspecified psychosis with paranoia and delusional thinking as well as cannabis use disorder who presented to ED again with paranoid seeking inpatient psychiatric admission. The patient is currently on Hospital Day 3.   Chart Review from last 24 hours:  The patient's chart was reviewed and nursing notes were reviewed. The patient's case was discussed in multidisciplinary team meeting. Per [facility] patient is compliant with medications on the unit, as needed Haldol for mild agitation as well as Benadryl were given 4/15 at 1 PM, as needed Atarax being used average once daily.  Information Obtained Today During Patient Interview: Staff report patient with no further episodes of bizarre behavior or agitation.  Upon evaluation today patient presents more linear and organized pleasant fluent and interactive, reports good sleep and appetite "I am ready to go" denies passive or active SI intention or plan denies HI or AVH does not present responding to stimuli no thought blocking noted during evaluation, denies depressed mood or anxiety, presents with fair insight that using marijuana on daily basis prior to admission led to her decompensation, denies side effect of medications, does not appear sleepy and does not display any sign consistent with EPS or TD, agrees to comply with medications and follow-up appointments as well as to abstain completely from marijuana use after discharge.  Discussed with patient titrating night dose of risperidone to 2 mg tonight.  Patient also agrees to start Invega Sustenna LAI tomorrow to improve long-term compliance and decrease risk of decompensation.  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)    Past Psychiatric History: See H&P  Past Medical History:  Past Medical History:  Diagnosis Date   Anxiety    Cluster B personality disorder (HCC)    Depression    Medical history non-contributory     Past Surgical History:  Procedure Laterality Date   CESAREAN SECTION     CESAREAN SECTION  04/18/2021   CESAREAN SECTION WITH BILATERAL TUBAL LIGATION N/A 04/18/2021   Procedure: CESAREAN SECTION   Family History:  Family History  Problem Relation Age of Onset   Diabetes Mother    Family Psychiatric  History: See H&P Social History: See H&P   Current Medications: Current Facility-Administered Medications  Medication Dose Route Frequency Provider Last Rate Last Admin   acetaminophen (TYLENOL) tablet 650 mg  650 mg Oral Q6H PRN Motley-Mangrum, Jadeka A, PMHNP   650 mg at 07/03/23 1307   alum & mag hydroxide-simeth (MAALOX/MYLANTA) 200-200-20 MG/5ML suspension 30 mL  30 mL Oral Q4H PRN Motley-Mangrum, Jadeka A, PMHNP       haloperidol (HALDOL) tablet 5 mg  5 mg Oral TID PRN Motley-Mangrum, Jadeka A, PMHNP   5 mg at 07/03/23 1307   And   diphenhydrAMINE (BENADRYL) capsule 50 mg  50 mg Oral TID PRN Motley-Mangrum, Jadeka A, PMHNP   50 mg at 07/03/23 1307   haloperidol lactate (HALDOL) injection 5 mg  5 mg Intramuscular TID PRN Motley-Mangrum, Jadeka A, PMHNP       And   diphenhydrAMINE (BENADRYL) injection 50 mg  50 mg Intramuscular TID PRN Motley-Mangrum, Jadeka A, PMHNP       And   LORazepam (ATIVAN) injection 2 mg  2 mg Intramuscular TID PRN Motley-Mangrum, Jadeka A,  PMHNP       haloperidol lactate (HALDOL) injection 10 mg  10 mg Intramuscular TID PRN Motley-Mangrum, Jadeka A, PMHNP       And   diphenhydrAMINE (BENADRYL) injection 50 mg  50 mg Intramuscular TID PRN Motley-Mangrum, Jadeka A, PMHNP       And   LORazepam (ATIVAN) injection 2 mg  2 mg Intramuscular TID PRN Motley-Mangrum, Jadeka A, PMHNP       hydrOXYzine (ATARAX) tablet 25 mg  25 mg Oral TID PRN  Motley-Mangrum, Jadeka A, PMHNP   25 mg at 07/04/23 0840   magnesium hydroxide (MILK OF MAGNESIA) suspension 30 mL  30 mL Oral Daily PRN Motley-Mangrum, Jadeka A, PMHNP       polyethylene glycol (MIRALAX / GLYCOLAX) packet 17 g  17 g Oral Daily Zouev, Dmitri, MD   17 g at 07/04/23 0837   risperiDONE (RISPERDAL) tablet 1 mg  1 mg Oral Daily Zouev, Dmitri, MD   1 mg at 07/04/23 0837   risperiDONE (RISPERDAL) tablet 2 mg  2 mg Oral QHS Linnie Riches, Ellis Mehaffey, MD       traZODone (DESYREL) tablet 50 mg  50 mg Oral QHS Linnie Riches, Esperansa Sarabia, MD   50 mg at 07/03/23 2105    Lab Results:  Results for orders placed or performed during the hospital encounter of 07/01/23 (from the past 48 hours)  CBC with Differential/Platelet     Status: None   Collection Time: 07/03/23  6:32 AM  Result Value Ref Range   WBC 5.8 4.0 - 10.5 K/uL   RBC 4.93 3.87 - 5.11 MIL/uL   Hemoglobin 12.8 12.0 - 15.0 g/dL   HCT 16.1 09.6 - 04.5 %   MCV 84.6 80.0 - 100.0 fL   MCH 26.0 26.0 - 34.0 pg   MCHC 30.7 30.0 - 36.0 g/dL   RDW 40.9 81.1 - 91.4 %   Platelets 363 150 - 400 K/uL   nRBC 0.0 0.0 - 0.2 %   Neutrophils Relative % 54 %   Neutro Abs 3.1 1.7 - 7.7 K/uL   Lymphocytes Relative 34 %   Lymphs Abs 2.0 0.7 - 4.0 K/uL   Monocytes Relative 9 %   Monocytes Absolute 0.5 0.1 - 1.0 K/uL   Eosinophils Relative 2 %   Eosinophils Absolute 0.1 0.0 - 0.5 K/uL   Basophils Relative 1 %   Basophils Absolute 0.1 0.0 - 0.1 K/uL   Immature Granulocytes 0 %   Abs Immature Granulocytes 0.01 0.00 - 0.07 K/uL    Comment: Performed at [facility], [address], [place], [place] [address]  Comprehensive metabolic panel     Status: Abnormal   Collection Time: 07/03/23  6:32 AM  Result Value Ref Range   Sodium 137 135 - 145 mmol/L   Potassium 3.6 3.5 - 5.1 mmol/L   Chloride 105 98 - 111 mmol/L   CO2 21 (L) 22 - 32 mmol/L   Glucose, Bld 97 70 - 99 mg/dL    Comment: Glucose reference range applies only to samples taken after  fasting for at least 8 hours.   BUN 6 6 - 20 mg/dL   Creatinine, Ser 6.21 0.44 - 1.00 mg/dL   Calcium 9.5 8.9 - 30.8 mg/dL   Total Protein 7.9 6.5 - 8.1 g/dL   Albumin 4.0 3.5 - 5.0 g/dL   AST 50 (H) 15 - 41 U/L   ALT 86 (H) 0 - 44 U/L   Alkaline Phosphatase 114 38 - 126  U/L   Total Bilirubin 0.8 0.0 - 1.2 mg/dL   GFR, Estimated >40 >98 mL/min    Comment: (NOTE) Calculated using the CKD-EPI Creatinine Equation (2021)    Anion gap 11 5 - 15    Comment: Performed at [facility], [address] [place], [place] [address]  Vitamin B12     Status: None   Collection Time: 07/03/23  6:32 AM  Result Value Ref Range   Vitamin B-12 409 180 - 914 pg/mL    Comment: (NOTE) This assay is not validated for testing neonatal or myeloproliferative syndrome specimens for Vitamin B12 levels. Performed at [facility], [address], [place], [place] [address]   Folate     Status: None   Collection Time: 07/03/23  6:32 AM  Result Value Ref Range   Folate 14.1 >5.9 ng/mL    Comment: Performed at [facility], [address] [place], [place] [address]    Blood Alcohol level:  Lab Results  Component Value Date   ETH <10 06/30/2023   ETH <10 06/09/2023    Metabolic Disorder Labs: Lab Results  Component Value Date   HGBA1C 4.9 06/09/2023   MPG 93.93 06/09/2023   No results found for: "PROLACTIN" Lab Results  Component Value Date   CHOL 154 06/09/2023   TRIG 51 06/09/2023   HDL 43 06/09/2023   CHOLHDL 3.6 06/09/2023   VLDL 10 06/09/2023   LDLCALC 101 (H) 06/09/2023    Physical Findings: AIMS:  , ,  ,  ,    CIWA:    COWS:     Musculoskeletal: Strength & Muscle Tone: within normal limits Gait & Station: normal Patient leans: N/A  Psychiatric Specialty Exam:  General Appearance: Appears a stated age, obese, dressed in hospital gown  Behavior: Guarded  Psychomotor Activity:No psychomotor agitation but mild  retardation noted  Eye Contact: Improved Speech: Normal   Mood: Less dysphoric Affect: Much less restricted, more fluent and  Thought Process: Organized with minimal thought blocking noted Descriptions of Associations: Intact, less concrete Thought Content: Hallucinations: Denies AH, VH, does not appear responding to stimuli Delusions: Denies paranoia or other delusions, presents guarded much less paranoid Suicidal Thoughts: Denies passive or active SI, intention, plan  Homicidal Thoughts: Denies HI, intention, plan   Alertness/Orientation: Alert and partially oriented  Insight: Improved Judgment: Improved  Memory: Limited  Executive Functions  Concentration: Limited Attention Span: Limited Recall: Limited Fund of Knowledge: Limited   Assets  Assets: Resilience Access to care   Physical Exam: Physical Exam Vitals and nursing note reviewed.    Review of Systems  All other systems reviewed and are negative.  Blood pressure 132/86, pulse 95, temperature 97.9 F (36.6 C), resp. rate 17, height 5\' 9"  (1.753 m), weight 120.6 kg, last menstrual period 06/01/2023, SpO2 100%, unknown if currently breastfeeding. Body mass index is 39.25 kg/m.   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 Medications:   Continue risperidone 1 mg in the morning and titrate night dose to 2 mg to address thought disorder and psychosis  If continues to improve on risperidone will start Invega Sustenna tomorrow 4/17 to improve long-term compliance and decrease risk of decompensation  Trazodone 50 mg at bedtime for sleep  Continue as needed Atarax for anxiety  Continue agitation protocol as needed --  The risks/benefits/side-effects/alternatives to this medication were discussed in detail with the patient and time was given for questions. The patient consents to medication trial.    -- Metabolic profile and EKG monitoring obtained while on an atypical antipsychotic (BMI: Lipid Panel: HbgA1c: QTc:)      3. Pertinent labs: Labwork reviewed      Lab ordered: None   4. Group and Therapy: -- Encouraged patient to participate in unit milieu and in scheduled group therapies   Patient was counseled regarding need to abstain from marijuana after discharge, she agrees.  -- Short Term Goals: Ability to identify changes in lifestyle to reduce recurrence of condition will improve, Ability to verbalize feelings will improve, Ability to disclose and discuss suicidal ideas, Ability to demonstrate self-control will improve, and Ability to identify and develop effective coping behaviors will improve  -- Long Term Goals: Improvement in symptoms so as ready for discharge  6. Discharge Planning:   -- Social work and case management to assist with discharge planning and identification of hospital follow-up needs prior to discharge  -- Estimated LOS: 5-7 days  -- Discharge Concerns: Need to establish a safety plan; Medication compliance and effectiveness  -- Discharge Goals: Return home with outpatient referrals for mental health follow-up including medication management/psychotherapy      Total Time Spent in Direct Patient Care:  I personally spent 35 minutes on  the unit in direct patient care. The direct patient care time included face-to-face time with the patient, reviewing the patient's chart, communicating with other professionals, and coordinating care. Greater than 50% of this time was spent in counseling or coordinating care with the patient regarding goals of hospitalization, psycho-education, and discharge planning needs.   Quinn Bartling Linnie Riches, MD 07/04/2023, 1:22 PM

## 2023-07-04 NOTE — Telephone Encounter (Signed)
 Patient Product/process development scientist completed.    The patient is insured through [employer]. Patient has Medicare and is not eligible for a copay card, but may be able to apply for patient assistance or Medicare RX Payment Plan (Patient Must reach out to their plan, if eligible for payment plan), if available.    Ran test claim for Invega 156 mg/ml and the current 30 day co-pay is $4.80.  Ran test claim for Uzedy 125 mg/ml and Not on Formulary  This test claim was processed through [facility]- copay amounts may vary at other pharmacies due to [employer], or as the patient moves through the different stages of their insurance plan.     Morgan Arab, CPHT Pharmacy Technician III Certified Patient Advocate [facility] Patient Advocate Team Direct Number: [phone]  Fax: [phone]

## 2023-07-05 DIAGNOSIS — F12259 Cannabis dependence with psychotic disorder, unspecified: Secondary | ICD-10-CM | POA: Diagnosis not present

## 2023-07-05 DIAGNOSIS — F200 Paranoid schizophrenia: Secondary | ICD-10-CM | POA: Diagnosis not present

## 2023-07-05 MED ORDER — RISPERIDONE 2 MG PO TABS
2.0000 mg | ORAL_TABLET | Freq: Two times a day (BID) | ORAL | Status: DC
  Administered 2023-07-05 – 2023-07-10 (×9): 2 mg via ORAL
  Filled 2023-07-05 (×18): qty 1

## 2023-07-05 NOTE — Progress Notes (Signed)
 [facility] MD Progress Note  07/05/2023 1:29 PM Carly Brooks  MRN:  [id number]   Reason for Admission:  28 y.o., female patient with PPH of unspecified psychosis with paranoia and delusional thinking as well as cannabis use disorder who presented to ED again with paranoid seeking inpatient psychiatric admission. The patient is currently on Hospital Day 4.   Chart Review from last 24 hours:  The patient's chart was reviewed and nursing notes were reviewed. The patient's case was discussed in multidisciplinary team meeting. Per [facility] patient is compliant with medications on the unit, as needed Haldol for mild agitation as well as Benadryl were given 4/15 at 1 PM, as needed Atarax being used average once daily.   Information Obtained Today During Patient Interview: When I come to assess the patient this morning her crying out loud in her room screaming "get me out of here I want to go home" when I get in her room to talk to her she is sitting up in bed crying asking to go home then she continues on her own "I am scared and I am confused I am scared because I am dark nobody likes me I am hearing voices I do not have any husbands" when asking her if the voices are telling her to harm herself she responds yes when I asked her if she has a thoughts to harm herself in the hospital she responds yes when asked if she has a plan she stares at the window, with delayed response she responds "yes" I asked for staff to start her on one-to-one precaution to ensure her safety, patient was given as needed agitation protocol for this presentation including Haldol and Benadryl. Will also watch for possible cheeking of her medicine and titrate scheduled risperidone to 2 mg twice daily with plan to start Invega sustain up to improve long-term compliance and decrease risk of decompensation.  Principal Problem: Schizophrenia (HCC) Diagnosis: Principal Problem:   Schizophrenia (HCC) Active Problems:   Cannabis-induced  psychotic disorder with moderate or severe use disorder (HCC)    Past Psychiatric History: See H&P  Past Medical History:  Past Medical History:  Diagnosis Date   Anxiety    Cluster B personality disorder (HCC)    Depression    Medical history non-contributory     Past Surgical History:  Procedure Laterality Date   CESAREAN SECTION     CESAREAN SECTION  04/18/2021   CESAREAN SECTION WITH BILATERAL TUBAL LIGATION N/A 04/18/2021   Procedure: CESAREAN SECTION   Family History:  Family History  Problem Relation Age of Onset   Diabetes Mother    Family Psychiatric  History: See H&P Social History: See H&P   Current Medications: Current Facility-Administered Medications  Medication Dose Route Frequency Provider Last Rate Last Admin   acetaminophen (TYLENOL) tablet 650 mg  650 mg Oral Q6H PRN Motley-Mangrum, Jadeka A, PMHNP   650 mg at 07/04/23 1737   alum & mag hydroxide-simeth (MAALOX/MYLANTA) 200-200-20 MG/5ML suspension 30 mL  30 mL Oral Q4H PRN Motley-Mangrum, Jadeka A, PMHNP       haloperidol (HALDOL) tablet 5 mg  5 mg Oral TID PRN Motley-Mangrum, Jadeka A, PMHNP   5 mg at 07/03/23 1307   And   diphenhydrAMINE (BENADRYL) capsule 50 mg  50 mg Oral TID PRN Motley-Mangrum, Jadeka A, PMHNP   50 mg at 07/03/23 1307   haloperidol lactate (HALDOL) injection 5 mg  5 mg Intramuscular TID PRN Motley-Mangrum, Jadeka A, PMHNP   5 mg at  07/05/23 0802   And   diphenhydrAMINE (BENADRYL) injection 50 mg  50 mg Intramuscular TID PRN Motley-Mangrum, Geralynn Ochs A, PMHNP   50 mg at 07/05/23 0802   And   LORazepam (ATIVAN) injection 2 mg  2 mg Intramuscular TID PRN Motley-Mangrum, Geralynn Ochs A, PMHNP   2 mg at 07/05/23 0802   haloperidol lactate (HALDOL) injection 10 mg  10 mg Intramuscular TID PRN Motley-Mangrum, Jadeka A, PMHNP       And   diphenhydrAMINE (BENADRYL) injection 50 mg  50 mg Intramuscular TID PRN Motley-Mangrum, Jadeka A, PMHNP       And   LORazepam (ATIVAN) injection 2 mg  2 mg  Intramuscular TID PRN Motley-Mangrum, Jadeka A, PMHNP       hydrOXYzine (ATARAX) tablet 25 mg  25 mg Oral TID PRN Motley-Mangrum, Jadeka A, PMHNP   25 mg at 07/04/23 2118   magnesium hydroxide (MILK OF MAGNESIA) suspension 30 mL  30 mL Oral Daily PRN Motley-Mangrum, Jadeka A, PMHNP       polyethylene glycol (MIRALAX / GLYCOLAX) packet 17 g  17 g Oral Daily Zouev, Dmitri, MD   17 g at 07/04/23 0837   risperiDONE (RISPERDAL) tablet 1 mg  1 mg Oral Daily Zouev, Dmitri, MD   1 mg at 07/05/23 1210   risperiDONE (RISPERDAL) tablet 2 mg  2 mg Oral QHS Lucifer Soja, MD   2 mg at 07/04/23 2057   traZODone (DESYREL) tablet 50 mg  50 mg Oral QHS Javelle Donigan, MD   50 mg at 07/04/23 2057    Lab Results:  No results found for this or any previous visit (from the past 48 hours).   Blood Alcohol level:  Lab Results  Component Value Date   ETH <10 06/30/2023   ETH <10 06/09/2023    Metabolic Disorder Labs: Lab Results  Component Value Date   HGBA1C 4.9 06/09/2023   MPG 93.93 06/09/2023   No results found for: "PROLACTIN" Lab Results  Component Value Date   CHOL 154 06/09/2023   TRIG 51 06/09/2023   HDL 43 06/09/2023   CHOLHDL 3.6 06/09/2023   VLDL 10 06/09/2023   LDLCALC 101 (H) 06/09/2023    Physical Findings: AIMS:  , ,  ,  ,    CIWA:    COWS:     Musculoskeletal: Strength & Muscle Tone: within normal limits Gait & Station: normal Patient leans: N/A  Psychiatric Specialty Exam:  General Appearance: Appears a stated age, obese, dressed in hospital gown  Behavior: Screaming and yelling  Psychomotor Activity: Mild to moderate psychomotor agitation  Eye Contact: Poor Speech: Yelling and screaming   Mood: Dysphoric and irritable Affect: Congruent  Thought Process: Disorganized Descriptions of Associations: Disorganized Thought Content: Hallucinations: Admits to auditory hallucinations occasionally commanding Delusions: Paranoid and delusional Suicidal Thoughts:  Admits to active SI with questionable vague plan Homicidal Thoughts: Denies HI, intention, plan   Alertness/Orientation: Alert and partially oriented  Insight: Poor Judgment: poor  Memory: Personnel officer  Concentration: Limited Attention Span: Limited Recall: Limited Fund of Knowledge: Limited   Assets  Assets: Resilience Access to care   Physical Exam: Physical Exam Vitals and nursing note reviewed.    Review of Systems  All other systems reviewed and are negative.  Blood pressure (!) 135/93, pulse (!) 139, temperature 97.9 F (36.6 C), resp. rate 17, height 5\' 9"  (1.753 m), weight 120.6 kg, last menstrual period 06/01/2023, SpO2 100%, unknown if currently breastfeeding. Body mass index is 39.25  kg/m.   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 Medications:   Titrate oral risperidone to 2 mg twice daily to address psychosis and monitor for cheeking  If continues to improve on risperidone will start Invega Sustenna to improve long-term compliance and decrease risk of decompensation  Trazodone 50 mg at bedtime for sleep  Continue as needed Atarax for anxiety  Continue agitation protocol as needed --  The  risks/benefits/side-effects/alternatives to this medication were discussed in detail with the patient and time was given for questions. The patient consents to medication trial.    -- Metabolic profile and EKG monitoring obtained while on an atypical antipsychotic (BMI: Lipid Panel: HbgA1c: QTc:)      3. Pertinent labs: Labwork reviewed      Lab ordered: None   4. Group and Therapy: -- Encouraged patient to participate in unit milieu and in scheduled group therapies   Patient was counseled regarding need to abstain from marijuana after discharge, she agrees.  -- Short Term Goals: Ability to identify changes in lifestyle to reduce recurrence of condition will improve, Ability to verbalize feelings will improve, Ability to disclose and discuss suicidal ideas, Ability to demonstrate self-control will improve, and Ability to identify and develop effective coping behaviors will improve  -- Long Term Goals: Improvement in symptoms so as ready for discharge  6. Discharge Planning:   -- Social work and case management to assist with discharge planning and identification of hospital follow-up needs prior to discharge  -- Estimated LOS: 5-7 days  -- Discharge Concerns: Need to establish a safety plan; Medication compliance and effectiveness  -- Discharge Goals: Return home with outpatient referrals for mental health follow-up including medication management/psychotherapy      Total Time Spent in Direct Patient Care:  I personally spent 35 minutes on the unit in direct patient care. The direct patient care time included face-to-face time with the patient, reviewing the patient's chart, communicating with other professionals, and coordinating care. Greater than 50% of this time was spent in counseling or coordinating care with the patient regarding goals of hospitalization, psycho-education, and discharge planning needs.   Bridgett Hattabaugh Linnie Riches, MD 07/05/2023, 1:29 PM

## 2023-07-05 NOTE — Progress Notes (Signed)
 1:1 Note: Patient placed on constant supervision due to threat of SI while looking towards the window during provider's assessment.  Patient yelling, screaming and flooded her bathroom.  Patient came out of her room naked.  Had to be redirected back to her room several times.  At 8 am, Prn Ativan 2 mg, Haldol 5 mg and Benadryl 50 mg given IM with good effect.  Routine safety checks maintained.  Patient in her room in bed in no acute distress.  Patient is safe on the unit with supervision.

## 2023-07-05 NOTE — Progress Notes (Addendum)
 Collateral contact - Mr. Nolon Baxter (dad) [phone]    5:15 PM - CSW left a voicemail (2nd attempt)   Rual Vermeer, LCSWA 07/05/2023

## 2023-07-05 NOTE — Plan of Care (Signed)
  Problem: Coping: Goal: Ability to verbalize frustrations and anger appropriately will improve Outcome: Progressing Goal: Ability to demonstrate self-control will improve Outcome: Progressing   Problem: Health Behavior/Discharge Planning: Goal: Identification of resources available to assist in meeting health care needs will improve Outcome: Progressing Goal: Compliance with treatment plan for underlying cause of condition will improve Outcome: Progressing

## 2023-07-05 NOTE — Plan of Care (Signed)
   Problem: Education: Goal: Mental status will improve Outcome: Not Progressing

## 2023-07-05 NOTE — Progress Notes (Signed)
 1:1 Note: Patient maintained on constant supervision for safety.  Patient in bed with eyes closed.  Breathing even and unlabored.  Patient currently denies suicidal thoughts/ideation.  Patient is safe on the unit.

## 2023-07-05 NOTE — Group Note (Signed)
 Recreation Therapy Group Note   Group Topic:Healthy Decision Making  Group Date: 07/05/2023 Start Time: 1015 End Time: 1045 Facilitators: Latronda Spink-McCall, LRT,CTRS Location: [address] Dayroom   Group Topic: Decision Making  Goal Area(s) Addresses:  Patient will identify the impact decisions can make. Patient will identify benefit of making positive decisions.   Intervention: Movie  Group Description: Patients watched were the main character had to choose between making good and bad choices. Patients got to visually see how the decisions one makes can not only impact them but the people around them.  Education: Scientist, physiological, Discharge Planning   Education Outcome: Acknowledges education/In group clarification/Needs additional education   Affect/Mood: N/A   Participation Level: Did not attend    Clinical Observations/Individualized Feedback:     Plan: Continue to engage patient in RT group sessions 2-3x/week.   Jenese Mischke-McCall, LRT,CTRS 07/05/2023 12:59 PM

## 2023-07-05 NOTE — Progress Notes (Signed)
 1:1 Note: Patient maintained on constant supervision for safety.  Patient in her room resting.  No behavioral episode noted this evening.  Routine safety checks maintained.  Patient is safe on the unit with supervision.

## 2023-07-06 ENCOUNTER — Encounter (HOSPITAL_COMMUNITY): Payer: Self-pay

## 2023-07-06 DIAGNOSIS — F12259 Cannabis dependence with psychotic disorder, unspecified: Secondary | ICD-10-CM | POA: Diagnosis not present

## 2023-07-06 DIAGNOSIS — F200 Paranoid schizophrenia: Secondary | ICD-10-CM | POA: Diagnosis not present

## 2023-07-06 MED ORDER — PALIPERIDONE PALMITATE ER 234 MG/1.5ML IM SUSY
234.0000 mg | PREFILLED_SYRINGE | Freq: Once | INTRAMUSCULAR | Status: AC
  Administered 2023-07-06: 234 mg via INTRAMUSCULAR

## 2023-07-06 MED ORDER — PALIPERIDONE PALMITATE ER 156 MG/ML IM SUSY
156.0000 mg | PREFILLED_SYRINGE | INTRAMUSCULAR | Status: DC
  Administered 2023-07-10: 156 mg via INTRAMUSCULAR
  Filled 2023-07-06 (×2): qty 1

## 2023-07-06 NOTE — Progress Notes (Signed)
 1:1 Note: Per MD, Patient is on a 1:1 while awake only. Patient has just woken up and the continuous supervision will begin.

## 2023-07-06 NOTE — Group Note (Signed)
 Date:  07/06/2023 Time:  9:26 PM  Group Topic/Focus:  Wrap-Up Group:   The focus of this group is to help patients review their daily goal of treatment and discuss progress on daily workbooks.    Participation Level:  Active  Participation Quality:  Appropriate  Affect:  Appropriate  Cognitive:  Appropriate  Insight: Appropriate  Engagement in Group:  Developing/Improving  Modes of Intervention:  Discussion  Additional Comments:  Pt stated her goal for today was to focus on her treatment plan. Pt stated she accomplished her goal today. Pt stated she talked with her doctor and with her social worker about her care today. Pt rated her overall day a 8 out of 10. Pt stated she was able to contact her father today which improved her overall day. Pt stated she felt better about herself tonight. Pt stated she was able to attend all meals today. Pt stated she took all medications provided today. Pt stated her appetite was fair today. Pt rated her sleep last night was poor. Pt stated the goal tonight was to get some rest. Pt stated she had no physical pain tonight. Pt deny visual hallucinations and auditory issues tonight. Pt denies thoughts of harming herself or others. Pt stated she would alert staff if anything changed.  Dwaine Gip 07/06/2023, 9:26 PM

## 2023-07-06 NOTE — [facility] Group Notes (Signed)
 Adult Psychoeducational Group Note  Date:  07/06/2023 Time:  6:55 PM  Group Topic/Focus:  Goals Group:   The focus of this group is to help patients establish daily goals to achieve during treatment and discuss how the patient can incorporate goal setting into their daily lives to aide in recovery. Orientation:   The focus of this group is to educate the patient on the purpose and policies of crisis stabilization and provide a format to answer questions about their admission.  The group details unit policies and expectations of patients while admitted.  Participation Level:  Did Not Attend  Participation Quality:    Affect:    Cognitive:    Insight:   Engagement in Group:    Modes of Intervention:    Additional Comments:    Carly Brooks O 07/06/2023, 6:55 PM

## 2023-07-06 NOTE — Progress Notes (Signed)
   07/05/23 2030  Psych Admission Type (Psych Patients Only)  Admission Status Voluntary  Psychosocial Assessment  Patient Complaints Anxiety  Eye Contact Fair  Facial Expression Flat  Affect Preoccupied  Speech Logical/coherent  Interaction Cautious  Motor Activity Restless  Appearance/Hygiene Unremarkable  Behavior Characteristics Impulsive  Mood Labile;Preoccupied  Thought Process  Coherency Disorganized  Content Preoccupation  Delusions Paranoid  Perception Derealization  Hallucination None reported or observed  Judgment Impaired  Confusion Mild  Danger to Self  Current suicidal ideation? Denies  Description of Suicide Plan No Plan  Self-Injurious Behavior No self-injurious ideation or behavior indicators observed or expressed   Agreement Not to Harm Self Yes  Description of Agreement Verbal contract  Danger to Others  Danger to Others None reported or observed

## 2023-07-06 NOTE — Plan of Care (Addendum)
 Pt came out of her room topless and smiling this evening as peers were in hall for evening medications prior to dinner. Per pt I just came out to put this in the hamper, I'm going back, smiling. Pt is verbally redirectable at this time. Safety maintained.    Problem: Health Behavior/Discharge Planning: Goal: Compliance with treatment plan for underlying cause of condition will improve Outcome: Progressing   Problem: Safety: Goal: Periods of time without injury will increase Outcome: Progressing   Problem: Nutritional: Goal: Ability to achieve adequate nutritional intake will improve Outcome: Progressing

## 2023-07-06 NOTE — BH IP Treatment Plan (Signed)
 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)   Current Medications:  Current Facility-Administered Medications  Medication Dose Route Frequency Provider Last Rate Last Admin   acetaminophen  (TYLENOL ) tablet 650 mg  650 mg Oral Q6H PRN Motley-Mangrum, Jadeka A, PMHNP   650 mg at 07/04/23 1737   alum & mag hydroxide-simeth (MAALOX/MYLANTA) 200-200-20 MG/5ML suspension 30 mL  30 mL Oral Q4H PRN Motley-Mangrum, Jadeka A, PMHNP       haloperidol  (HALDOL ) tablet 5 mg  5 mg Oral TID PRN Motley-Mangrum, Jadeka A, PMHNP   5 mg at 07/03/23 1307   And   diphenhydrAMINE  (BENADRYL ) capsule 50 mg  50 mg Oral TID PRN Motley-Mangrum, Jadeka A, PMHNP   50 mg at 07/03/23 1307   haloperidol  lactate (HALDOL ) injection 5 mg  5 mg Intramuscular TID PRN Motley-Mangrum, Jadeka A, PMHNP   5 mg at 07/05/23 0802   And   diphenhydrAMINE  (BENADRYL ) injection 50 mg  50 mg Intramuscular TID PRN Motley-Mangrum, Jadeka A, PMHNP   50 mg at 07/05/23 0802   And   LORazepam  (ATIVAN ) injection 2 mg  2 mg Intramuscular TID PRN Motley-Mangrum, Jadeka A, PMHNP   2 mg at 07/05/23 0802   haloperidol  lactate (HALDOL ) injection 10 mg  10 mg Intramuscular TID PRN Motley-Mangrum, Jadeka A, PMHNP       And   diphenhydrAMINE  (BENADRYL ) injection 50 mg  50 mg Intramuscular TID PRN Motley-Mangrum, Jadeka A, PMHNP       And   LORazepam  (ATIVAN ) injection 2 mg  2 mg Intramuscular TID PRN Motley-Mangrum, Jadeka A, PMHNP       hydrOXYzine  (ATARAX ) tablet 25 mg  25 mg Oral TID PRN Motley-Mangrum, Jadeka A, PMHNP   25 mg at 07/04/23 2118   magnesium  hydroxide (MILK OF MAGNESIA) suspension 30 mL  30 mL Oral Daily PRN Motley-Mangrum, Jadeka A,  PMHNP       [START ON 07/10/2023] paliperidone  (INVEGA  SUSTENNA) injection 156 mg  156 mg Intramuscular Q28 days Attiah, Nadir, MD       polyethylene glycol (MIRALAX  / GLYCOLAX ) packet 17 g  17 g Oral Daily Zouev, Dmitri, MD   17 g at 07/06/23 0827   risperiDONE  (RISPERDAL ) tablet 2 mg  2 mg Oral QHS Attiah, Nadir, MD   2 mg at 07/05/23 2042   risperiDONE  (RISPERDAL ) tablet 2 mg  2 mg Oral BID Linnie Riches, Nadir, MD   2 mg at 07/06/23 0827   traZODone  (DESYREL ) tablet 50 mg  50 mg Oral QHS Attiah, Nadir, MD   50 mg at 07/05/23 2042   PTA Medications: Medications Prior to Admission  Medication Sig Dispense Refill Last Dose/Taking   ARIPiprazole  (ABILIFY ) 20 MG tablet Take 20 mg by mouth daily.   07/01/2023   QUEtiapine  (SEROQUEL ) 200 MG tablet Take 200 mg by mouth at bedtime.   07/01/2023 Bedtime    Patient Stressors: Financial difficulties   Substance abuse   Traumatic event    Patient Strengths: Capable of independent living  Education administrator  Physical Health  Supportive family/friends   Treatment Modalities: Medication Management, Group therapy, Case management,  1 to 1 session with clinician, Psychoeducation, Recreational therapy.   Physician Treatment Plan for Primary Diagnosis: Schizophrenia [facility]) Long Term Goal(s): Improvement in  symptoms so as ready for discharge   Short Term Goals: Ability to identify changes in lifestyle to reduce recurrence of condition will improve Ability to verbalize feelings will improve Ability to disclose and discuss suicidal ideas Ability to demonstrate self-control will improve Ability to identify and develop effective coping behaviors will improve  Medication Management: Evaluate patient's response, side effects, and tolerance of medication regimen.  Therapeutic Interventions: 1 to 1 sessions, Unit Group sessions and Medication administration.  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  Therapeutic Interventions: 1 to 1 sessions, Unit Group sessions and Medication administration.  Evaluation of Outcomes: Progressing   RN Treatment Plan for Primary Diagnosis: Schizophrenia (HCC) Long Term Goal(s): Knowledge of disease and therapeutic regimen to maintain health will improve  Short Term Goals: Ability to remain free from injury will improve, Ability to verbalize frustration and anger appropriately will improve, Ability to participate in decision making will improve, Ability to verbalize feelings will improve, Ability to identify and develop effective coping behaviors will improve, and Compliance with prescribed medications will improve  Medication Management: RN will administer medications as ordered by provider, will assess and evaluate patient's response and provide education to patient for prescribed medication. RN will report any adverse and/or side effects to prescribing provider.  Therapeutic Interventions: 1 on 1 counseling sessions, Psychoeducation, Medication administration, Evaluate responses to treatment, Monitor vital signs and CBGs as ordered, Perform/monitor CIWA, COWS, AIMS and Fall Risk screenings as ordered, Perform wound care treatments as ordered.  Evaluation of Outcomes: Progressing   LCSW Treatment Plan for Primary  Diagnosis: Schizophrenia (HCC) Long Term Goal(s): Safe transition to appropriate next level of care at discharge, Engage patient in therapeutic group addressing interpersonal concerns.  Short Term Goals: Engage patient in aftercare planning with referrals and resources, Increase ability to appropriately verbalize feelings, Facilitate acceptance of mental health diagnosis and concerns, and Identify triggers associated with mental health/substance abuse issues  Therapeutic Interventions: Assess for all discharge needs, 1 to 1 time with Social worker, Explore available resources and support systems, Assess for adequacy in community support network, Educate family and significant other(s) on suicide prevention, Complete Psychosocial Assessment, Interpersonal group therapy.  Evaluation of Outcomes: Progressing   Progress in Treatment: Attending groups: No. Participating in groups: No. Taking medication as prescribed: Yes. Toleration medication: Yes. Family/Significant other contact made: No, will contact:   Mr. Nolon Baxter (dad) [phone].  Attempts made on 07/03/2023 and 07/05/2023. Patient understands diagnosis: No Discussing patient identified problems/goals with staff: Yes. Medical problems stabilized or resolved: Yes. Denies suicidal/homicidal ideation: Yes. Issues/concerns per patient self-inventory: No. Other: N/a   New problem(s) identified: No, Describe:  None   New Short Term/Long Term Goal(s): medication stabilization, elimination of SI thoughts, development of comprehensive mental wellness plan.    Patient Goals: "I'm losing my eye sight"   Discharge Plan or Barriers: Patient recently admitted. CSW will continue to follow and assess for appropriate referrals  and possible discharge planning.    Reason for Continuation of Hospitalization: Medication stabilization Other; describe Mood stabilization, discharge planning   Estimated Length of Stay: 4 - 6 days  Last 3 [place] Suicide  Severity Risk Score: Flowsheet Row [facility] (Current) [facility]30/2023 in [facility] Department [facility] ED from 06/09/2023 in [facility]  C-SSRS RISK CATEGORY No Risk No Risk No Risk       Last [facility] 2/9 Scores:    05/23/2023   10:48 AM 03/07/2023    4:33 PM 01/22/2023    3:41 PM  Depression screen PHQ 2/9  Decreased Interest 0 2 2  Down, Depressed, Hopeless 0 2 2  PHQ - 2 Score 0 4 4  Altered sleeping 0 2 2  Tired, decreased energy 0 2 2  Change in appetite 0 2 2  Feeling bad or failure about yourself  0 2 2  Trouble concentrating 1 0 2  Moving slowly or fidgety/restless 0 0 0  Suicidal thoughts 0 0 0  PHQ-9 Score 1 12 14   Difficult doing work/chores Not difficult at all Very difficult Somewhat difficult    Scribe for Treatment Team: Wilgus Deyton O Oreoluwa Aigner, LCSWA 07/06/2023 4:54 PM

## 2023-07-06 NOTE — Progress Notes (Signed)
 Pt visible in the milieu.  Interacting appropriately with staff and peers.  Pt denied SI, HI, AVH.  Pt reported she was feeling a little depressed.  She stated this happens when she talks to her family on the phone but she in unsure why.  Needs assessed. Pt denied.  Support provided.  Pt receptive.  Safety checks continue for patient safety.  Pt safe on unit.

## 2023-07-06 NOTE — Progress Notes (Signed)
 [facility] Post 1:1 Observation Documentation  For the first (8) hours following discontinuation of 1:1 precautions, a progress note entry by nursing staff should be documented at least every 2 hours, reflecting the patient's behavior, condition, mood, and conversation.  Use the progress notes for additional entries.  Time 1:1 discontinued:  0852  Patient's Behavior: Animated but anxious  Patient's Condition:  cooperative with care.  Patient's Conversation:  "I'm ready to go home.  Carly Applegate 07/06/2023, 0852

## 2023-07-06 NOTE — Group Note (Signed)
 Recreation Therapy Group Note   Group Topic:Coping Skills  Group Date: 07/06/2023 Start Time: 1042 End Time: 1058 Facilitators: Travius Crochet-McCall, LRT,CTRS Location: 500 [address] Dayroom   Group Topic: Coping Skills  Goal Area(s) Addresses:  Patient will identify the difference between healthy vs. Unhealthy coping skills. Patient will identify benefits of healthy coping skills. Patient will identify how coping skills can be used post d/c.  Intervention: Worksheet  Activity: LRT and patients discussed the difference between healthy and unhealthy coping strategies. LRT and patients also viewed different scenarios and discussed how each type of coping skill can affect the situation and outcome. Patients were to then identify a problem they are currently dealing with. Patients were to then identify the unhealthy coping skills they have used to address that problem and results. They would then identify healthy coping skills to address the same problem and identify the expected outcomes and barriers to using those healthy coping strategies.   Education: Pharmacologist, Building control surveyor.   Education Outcome: Acknowledges understanding/In group clarification offered/Needs additional education.    Affect/Mood: N/A   Participation Level: Did not attend    Clinical Observations/Individualized Feedback:     Plan: Continue to engage patient in RT group sessions 2-3x/week.   Anderson Middlebrooks-McCall, LRT,CTRS 07/06/2023 1:38 PM

## 2023-07-06 NOTE — Progress Notes (Signed)
 [facility] MD Progress Note  07/06/2023 11:54 AM Carly Brooks  MRN:  [id number]   Reason for Admission:  28 y.o., female patient with PPH of unspecified psychosis with paranoia and delusional thinking as well as cannabis use disorder who presented to ED again with paranoid seeking inpatient psychiatric admission. The patient is currently on Hospital Day 5.   Chart Review from last 24 hours:  The patient's chart was reviewed and nursing notes were reviewed. The patient's case was discussed in multidisciplinary team meeting. Per [facility] patient is compliant with medications on the unit, as needed Haldol  for mild agitation as well as Benadryl  were given 4/ 17 at 8 AM, as needed Atarax  being used average once daily.   Information Obtained Today During Patient Interview: Upon evaluation this morning patient presents much improved lying down in bed call pleasant and interactive, smiling appropriately answering questions in linear manner telling me that she was feeling scared and disorganized yesterday but does not feel like that this morning, reports good sleep and appetite, denies SI HI or AVH.  She is compliant with her medications since I titrated her risperidone  yesterday to 2 mg twice daily.  She denies side effect of medications, does not appear sleepy and does not display any sign consistent with EPS or TD.  I discussed with patient the plan to start Invega  Sustenna loading doses today and then on Tuesday brought to discharge with plan to discharge her then if continues to do well on the unit, patient agrees.  When improvement today I will discontinue one-to-one precaution.   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)    Past Psychiatric History: See H&P  Past Medical History:  Past Medical History:  Diagnosis Date   Anxiety    Cluster B  personality disorder (HCC)    Depression    Medical history non-contributory     Past Surgical History:  Procedure Laterality Date   CESAREAN SECTION     CESAREAN SECTION  04/18/2021   CESAREAN SECTION WITH BILATERAL TUBAL LIGATION N/A 04/18/2021   Procedure: CESAREAN SECTION   Family History:  Family History  Problem Relation Age of Onset   Diabetes Mother    Family Psychiatric  History: See H&P Social History: See H&P   Current Medications: Current Facility-Administered Medications  Medication Dose Route Frequency Provider Last Rate Last Admin   acetaminophen  (TYLENOL ) tablet 650 mg  650 mg Oral Q6H PRN Motley-Mangrum, Jadeka A, PMHNP   650 mg at 07/04/23 1737   alum & mag hydroxide-simeth (MAALOX/MYLANTA) 200-200-20 MG/5ML suspension 30 mL  30 mL Oral Q4H PRN Motley-Mangrum, Jadeka A, PMHNP       haloperidol  (HALDOL ) tablet 5 mg  5 mg Oral TID PRN Motley-Mangrum, Jadeka A, PMHNP   5 mg at 07/03/23 1307   And   diphenhydrAMINE  (BENADRYL ) capsule 50 mg  50 mg Oral TID PRN Motley-Mangrum, Jadeka A, PMHNP   50 mg at 07/03/23 1307   haloperidol  lactate (HALDOL ) injection 5 mg  5 mg Intramuscular TID PRN Motley-Mangrum, Jadeka A, PMHNP   5 mg at 07/05/23 0802   And   diphenhydrAMINE  (BENADRYL ) injection 50 mg  50 mg Intramuscular TID PRN Motley-Mangrum, Jadeka A, PMHNP   50 mg at 07/05/23 0802   And   LORazepam  (ATIVAN ) injection 2 mg  2 mg Intramuscular TID PRN Motley-Mangrum, Jadeka A, PMHNP   2 mg at 07/05/23  0802   haloperidol  lactate (HALDOL ) injection 10 mg  10 mg Intramuscular TID PRN Motley-Mangrum, Jadeka A, PMHNP       And   diphenhydrAMINE  (BENADRYL ) injection 50 mg  50 mg Intramuscular TID PRN Motley-Mangrum, Jadeka A, PMHNP       And   LORazepam  (ATIVAN ) injection 2 mg  2 mg Intramuscular TID PRN Motley-Mangrum, Jadeka A, PMHNP       hydrOXYzine  (ATARAX ) tablet 25 mg  25 mg Oral TID PRN Motley-Mangrum, Jadeka A, PMHNP   25 mg at 07/04/23 2118   magnesium  hydroxide  (MILK OF MAGNESIA) suspension 30 mL  30 mL Oral Daily PRN Motley-Mangrum, Jadeka A, PMHNP       polyethylene glycol (MIRALAX  / GLYCOLAX ) packet 17 g  17 g Oral Daily Zouev, Dmitri, MD   17 g at 07/06/23 0827   risperiDONE  (RISPERDAL ) tablet 2 mg  2 mg Oral QHS Patrina Andreas, MD   2 mg at 07/05/23 2042   risperiDONE  (RISPERDAL ) tablet 2 mg  2 mg Oral BID Linnie Riches, Trevonn Hallum, MD   2 mg at 07/06/23 0827   traZODone  (DESYREL ) tablet 50 mg  50 mg Oral QHS Rodman Recupero, MD   50 mg at 07/05/23 2042    Lab Results:  No results found for this or any previous visit (from the past 48 hours).   Blood Alcohol level:  Lab Results  Component Value Date   ETH <10 06/30/2023   ETH <10 06/09/2023    Metabolic Disorder Labs: Lab Results  Component Value Date   HGBA1C 4.9 06/09/2023   MPG 93.93 06/09/2023   No results found for: "PROLACTIN" Lab Results  Component Value Date   CHOL 154 06/09/2023   TRIG 51 06/09/2023   HDL 43 06/09/2023   CHOLHDL 3.6 06/09/2023   VLDL 10 06/09/2023   LDLCALC 101 (H) 06/09/2023    Physical Findings: AIMS:  , ,  ,  ,    CIWA:    COWS:     Musculoskeletal: Strength & Muscle Tone: within normal limits Gait & Station: normal Patient leans: N/A  Psychiatric Specialty Exam:  General Appearance: Appears a stated age, obese, dressed in hospital gown  Behavior: Screaming and yelling  Psychomotor Activity: Mild to moderate psychomotor agitation  Eye Contact: Poor Speech: Yelling and screaming   Mood: Dysphoric and irritable Affect: Congruent  Thought Process: Disorganized Descriptions of Associations: Disorganized Thought Content: Hallucinations: Admits to auditory hallucinations occasionally commanding Delusions: Paranoid and delusional Suicidal Thoughts: Admits to active SI with questionable vague plan Homicidal Thoughts: Denies HI, intention, plan   Alertness/Orientation: Alert and partially oriented  Insight: Poor Judgment: poor  Memory:  Personnel officer  Concentration: Limited Attention Span: Limited Recall: Limited Fund of Knowledge: Limited   Assets  Assets: Resilience Access to care   Physical Exam: Physical Exam Vitals and nursing note reviewed.    Review of Systems  All other systems reviewed and are negative.  Blood pressure 131/84, pulse (!) 106, temperature 98.4 F (36.9 C), temperature source Oral, resp. rate 18, height 5\' 9"  (1.753 m), weight 120.6 kg, last menstrual period 06/01/2023, SpO2 100%, unknown if currently breastfeeding. Body mass index is 39.25 kg/m.   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 Medications:   Continue risperidone  2 mg twice daily to address psychosis and monitor for cheeking  Start Invega  Sustenna first injection today, second loading injection on Tuesday 4/22 with plan to discharge her then if continues to do well.  Trazodone  50 mg at bedtime for sleep  Continue as needed Atarax  for anxiety  Continue agitation protocol as needed --  The risks/benefits/side-effects/alternatives to this medication were discussed in detail with the patient and time was given for questions. The patient consents to medication trial.    --  Metabolic profile and EKG monitoring obtained while on an atypical antipsychotic (BMI: Lipid Panel: HbgA1c: QTc:)      3. Pertinent labs: Labwork reviewed      Lab ordered: None   4. Group and Therapy: -- Encouraged patient to participate in unit milieu and in scheduled group therapies   Patient was counseled regarding need to abstain from marijuana after discharge, she agrees.  -- Short Term Goals: Ability to identify changes in lifestyle to reduce recurrence of condition will improve, Ability to verbalize feelings will improve, Ability to disclose and discuss suicidal ideas, Ability to demonstrate self-control will improve, and Ability to identify and develop effective coping behaviors will improve  -- Long Term Goals: Improvement in symptoms so as ready for discharge  6. Discharge Planning:   -- Social work and case management to assist with discharge planning and identification of hospital follow-up needs prior to discharge  -- Estimated LOS: 5-7 days  -- Discharge Concerns: Need to establish a safety plan; Medication compliance and effectiveness  -- Discharge Goals: Return home with outpatient referrals for mental health follow-up including medication management/psychotherapy      Total Time Spent in Direct Patient Care:  I personally spent 35 minutes on the unit in direct patient care. The direct patient care time included face-to-face time with the patient, reviewing the patient's chart, communicating with other professionals, and coordinating care. Greater than 50% of this time was spent in counseling or coordinating care with the patient regarding goals of hospitalization, psycho-education, and discharge planning needs.   Jonan Seufert Linnie Riches, MD 07/06/2023, 11:54 AM

## 2023-07-06 NOTE — Progress Notes (Signed)
 Collateral contact - Mr. Nolon Baxter (dad) [phone]     5:34 PM - CSW left a voicemail (3rd attempt)     Paymon Rosensteel, LCSWA 07/05/2023

## 2023-07-06 NOTE — Progress Notes (Addendum)
 [facility] Post 1:1 Observation Documentation  For the first (8) hours following discontinuation of 1:1 precautions, a progress note entry by nursing staff should be documented at least every 2 hours, reflecting the patient's behavior, condition, mood, and conversation.  Use the progress notes for additional entries.  Time 1:1 discontinued:  0852  Patient's Behavior: Cooperative with unit routines.   Patient's Condition:  calm without outburst.  Patient's Conversation:  Denies concerns at this time.   Carly Brooks, Carly Brooks 07/06/2023, 1452 pm

## 2023-07-06 NOTE — Plan of Care (Signed)
   Problem: Education: Goal: Emotional status will improve Outcome: Progressing Goal: Verbalization of understanding the information provided will improve Outcome: Progressing

## 2023-07-06 NOTE — Progress Notes (Addendum)
 [facility] Post 1:1 Observation Documentation  For the first (8) hours following discontinuation of 1:1 precautions, a progress note entry by nursing staff should be documented at least every 2 hours, reflecting the patient's behavior, condition, mood, and conversation.  Use the progress notes for additional entries.  Time 1:1 discontinued:  0852  Patient's Behavior:  animated, awake in dayroom at brief intervals  Patient's Condition:  Cooperative with care. No incident of disrobing.  Patient's Conversation:  "I'm fine right now".  Pt A & O to self, place, situation and day of week. Denies SI, HI, AVH and pain when assessed. Rates her anxiety 4/10 and depression 3/10 "I'm just ready to go see my babies". Remains medication compliant without adverse drug reactions. Denies concerns at this time; no incident of disrobing. Safety checks maintained at Q 15 minutes intervals without outburst.   Carly Brooks, Carly Brooks 07/06/2023, 1052

## 2023-07-06 NOTE — Progress Notes (Signed)
 [facility] Post 1:1 Observation Documentation  For the first (8) hours following discontinuation of 1:1 precautions, a progress note entry by nursing staff should be documented at least every 2 hours, reflecting the patient's behavior, condition, mood, and conversation. Use the progress notes for additional entries.  Time 1:1 discontinued:  0852  Patient's Behavior: calm    Patient's Condition:  Verbally redirectable.  Patient's Conversation:  Denies concerns when approached.   Lashia Niese 07/06/2023, 1252

## 2023-07-07 NOTE — Progress Notes (Signed)
 Patient provided written and verbal permission to contact her grandfather Mr. Nolon Baxter [phone] to confirm plan for discharge to his home. Phone attempt unsuccessful. Additional attempts to be made to confirm discharge plan.   Josette Shimabukuro, LCSW Transition of Care

## 2023-07-07 NOTE — Plan of Care (Signed)
  Problem: Education: Goal: Mental status will improve Outcome: Progressing   Problem: Coping: Goal: Ability to verbalize frustrations and anger appropriately will improve Outcome: Progressing   Problem: Safety: Goal: Periods of time without injury will increase Outcome: Progressing   Problem: Coping: Goal: Ability to verbalize frustrations and anger appropriately will improve Outcome: Progressing

## 2023-07-07 NOTE — Plan of Care (Signed)
   Problem: Education: Goal: Knowledge of [facility] Education information/materials will improve Outcome: Progressing Goal: Emotional status will improve Outcome: Progressing Goal: Mental status will improve Outcome: Progressing Goal: Verbalization of understanding the information provided will improve Outcome: Progressing   Problem: Safety: Goal: Periods of time without injury will increase Outcome: Progressing

## 2023-07-07 NOTE — Progress Notes (Signed)
 [facility] MD Progress Note  07/07/2023 8:06 AM Carly Brooks Carly Brooks  MRN:  [id number]   Reason for Admission:  28 y.o., female patient with PPH of unspecified psychosis with paranoia and delusional thinking as well as cannabis use disorder who presented to ED again with paranoid seeking inpatient psychiatric admission. The patient is currently on Hospital Day 6.   Chart Review from last 24 hours:  The patient's chart was reviewed and nursing notes were reviewed. Significant vitals signs: P 108. The patient's case was discussed in multidisciplinary team meeting. Per [facility], patient was taking medications appropriately. The following as needed medications were given: none. Per nursing, patient is calm and cooperative and did not attend group sessions.     Information Obtained Today During Patient Interview: The patient was seen in her room laying in bed, no acute distress. On assessment, the patient feels "good" today. Patient feels the group sessions have been good.  She said that they talked about strength and how she has resilience.  When I asked her how she maintains resilience, she states that she stays positive and open minded. When asked about speaking with family, she states that she spoke with her father and mother and it was a good conversation they spoke about [place] and her pet parrots.   Patient reports having good sleep, denying issues falling or staying asleep.  Patient reports good appetite.  She denies adverse effects from Risperdal  and Invega .  She reports some soreness where the injection site is and I discussed how this is a normal effect.  I discussed the importance of decreasing marijuana use when she leaves the hospital.  She states that at this time she is currently using 3 joints daily.  I discussed that if she decreases the amount of marijuana at a reasonable pace and set goals for herself that it would be more tolerable and she is agreeable to this plan.  She understands the  negative consequences of marijuana for her mental health.  Patient denies current SI, HI, AVH.   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)    Past Psychiatric History: See H&P  Past Medical History:  Past Medical History:  Diagnosis Date   Anxiety    Cluster B personality disorder (HCC)    Depression    Medical history non-contributory     Past Surgical History:  Procedure Laterality Date   CESAREAN SECTION     CESAREAN SECTION  04/18/2021   CESAREAN SECTION WITH BILATERAL TUBAL LIGATION N/A 04/18/2021   Procedure: CESAREAN SECTION   Family History:  Family History  Problem Relation Age of Onset   Diabetes Mother    Family Psychiatric  History: See H&P Social History: See H&P   Current Medications: Current Facility-Administered Medications  Medication Dose Route Frequency Provider Last Rate Last Admin   acetaminophen  (TYLENOL ) tablet 650 mg  650 mg Oral Q6H PRN Motley-Mangrum, Jadeka A, PMHNP   650 mg at 07/04/23 1737   alum & mag hydroxide-simeth (MAALOX/MYLANTA) 200-200-20 MG/5ML suspension 30 mL  30 mL Oral Q4H PRN Motley-Mangrum, Jadeka A, PMHNP       haloperidol  (HALDOL ) tablet 5 mg  5 mg Oral TID PRN Motley-Mangrum, Jadeka A, PMHNP   5 mg at 07/03/23 1307   And   diphenhydrAMINE  (BENADRYL ) capsule 50 mg  50 mg Oral TID PRN Motley-Mangrum, Jadeka A, PMHNP   50 mg at 07/03/23 1307  haloperidol  lactate (HALDOL ) injection 5 mg  5 mg Intramuscular TID PRN Motley-Mangrum, Jadeka A, PMHNP   5 mg at 07/05/23 0802   And   diphenhydrAMINE  (BENADRYL ) injection 50 mg  50 mg Intramuscular TID PRN Motley-Mangrum, Jadeka A, PMHNP   50 mg at 07/05/23 0802   And   LORazepam  (ATIVAN ) injection 2 mg  2 mg Intramuscular TID PRN Motley-Mangrum, Jadeka A, PMHNP   2 mg at 07/05/23 0802   haloperidol  lactate (HALDOL ) injection 10 mg  10  mg Intramuscular TID PRN Motley-Mangrum, Jadeka A, PMHNP       And   diphenhydrAMINE  (BENADRYL ) injection 50 mg  50 mg Intramuscular TID PRN Motley-Mangrum, Jadeka A, PMHNP       And   LORazepam  (ATIVAN ) injection 2 mg  2 mg Intramuscular TID PRN Motley-Mangrum, Jadeka A, PMHNP       hydrOXYzine  (ATARAX ) tablet 25 mg  25 mg Oral TID PRN Motley-Mangrum, Jadeka A, PMHNP   25 mg at 07/04/23 2118   magnesium  hydroxide (MILK OF MAGNESIA) suspension 30 mL  30 mL Oral Daily PRN Motley-Mangrum, Jadeka A, PMHNP       [START ON 07/10/2023] paliperidone  (INVEGA  SUSTENNA) injection 156 mg  156 mg Intramuscular Q28 days Attiah, Nadir, MD       polyethylene glycol (MIRALAX  / GLYCOLAX ) packet 17 g  17 g Oral Daily Zouev, Dmitri, MD   17 g at 07/06/23 0827   risperiDONE  (RISPERDAL ) tablet 2 mg  2 mg Oral QHS Attiah, Nadir, MD   2 mg at 07/06/23 2138   risperiDONE  (RISPERDAL ) tablet 2 mg  2 mg Oral BID Linnie Riches, Nadir, MD   2 mg at 07/06/23 0827   traZODone  (DESYREL ) tablet 50 mg  50 mg Oral QHS Attiah, Nadir, MD   50 mg at 07/06/23 2138    Lab Results:  No results found for this or any previous visit (from the past 48 hours).   Blood Alcohol level:  Lab Results  Component Value Date   ETH <10 06/30/2023   ETH <10 06/09/2023    Metabolic Disorder Labs: Lab Results  Component Value Date   HGBA1C 4.9 06/09/2023   MPG 93.93 06/09/2023   No results found for: "PROLACTIN" Lab Results  Component Value Date   CHOL 154 06/09/2023   TRIG 51 06/09/2023   HDL 43 06/09/2023   CHOLHDL 3.6 06/09/2023   VLDL 10 06/09/2023   LDLCALC 101 (H) 06/09/2023    Physical Findings: AIMS:  , ,  ,  ,    CIWA:    COWS:     Musculoskeletal: Strength & Muscle Tone: within normal limits Gait & Station: normal Patient leans: N/A  Psychiatric Specialty Exam:  Presentation  General Appearance: Appropriate for Environment; Fairly Groomed   Eye Contact:Good   Speech:Clear and Coherent; Normal Rate   Speech  Volume:Normal   Handedness:Right   Mood and Affect  Mood:Euthymic   Affect:Congruent; Appropriate    Thought Process  Thought Processes:Coherent; Goal Directed; Linear   Duration of Psychotic Symptoms: N/A  Past Diagnosis of Schizophrenia or Psychoactive disorder: No  Descriptions of Associations:Intact   Orientation:Full (Time, Place and Person)   Thought Content:Logical   Hallucinations:Hallucinations: None   Ideas of Reference:None   Suicidal Thoughts:Suicidal Thoughts: No   Homicidal Thoughts:Homicidal Thoughts: No    Sensorium  Memory:Immediate Good; Recent Good; Remote Good   Judgment:Fair   Insight:Good    Executive Functions  Concentration:Good   Attention Span:Good   Recall:Good   Progress Energy  of Knowledge:Good   Language:Good    Psychomotor Activity  Psychomotor Activity:Psychomotor Activity: Normal    Assets  Assets:Communication Skills; Desire for Improvement; Resilience  Physical Exam  General: Pleasant, well-appearing. No acute distress. Pulmonary: Normal effort. No wheezing or rales. Skin: No obvious rash or lesions. Neuro: A&Ox3.No focal deficit.   Review of Systems  No reported symptoms   Blood pressure 127/84, pulse (!) 108, temperature 98.1 F (36.7 C), resp. rate 18, height 5\' 9"  (1.753 m), weight 120.6 kg, last menstrual period 06/01/2023, SpO2 100%, unknown if currently breastfeeding. Body mass index is 39.25 kg/m.   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 Medications:   Continue risperidone  2 mg twice daily to address psychosis and monitor for cheeking  Received Invega  Sustenna first injection 4/18, second loading injection on Tuesday 4/22 with plan to discharge her then if continues to do well.  Trazodone  50 mg at bedtime for sleep  Continue as needed Atarax  for anxiety  Continue agitation protocol as needed  --  The risks/benefits/side-effects/alternatives to this medication were discussed in detail with the patient and time was given for questions. The patient consents to medication trial.    -- Metabolic profile and EKG monitoring obtained while on an atypical antipsychotic (BMI: 39.25 Lipid Panel: LDL 101 HbgA1c: 4.9 QTc:403)      3. Pertinent labs: Labwork reviewed      Lab ordered: None   4. Group and Therapy: -- Encouraged patient to participate in unit milieu and in scheduled group therapies   Patient was counseled regarding need to abstain from marijuana after discharge, she agrees.  -- Short Term Goals: Ability to identify changes in lifestyle to reduce recurrence of condition will improve, Ability to verbalize feelings will improve, Ability to disclose and discuss suicidal ideas, Ability to demonstrate self-control will improve, and Ability to identify and develop effective coping behaviors will improve  -- Long Term Goals: Improvement in symptoms so as ready for discharge  6. Discharge Planning:   -- Social work and case management to assist with discharge planning and identification of hospital follow-up needs prior to discharge  -- Estimated LOS: 5-7 days  -- Discharge Concerns: Need to establish a safety plan; Medication compliance  and effectiveness  -- Discharge Goals: Return home with outpatient referrals for mental health follow-up including medication management/psychotherapy    Joice Nares, MD 07/07/2023, 8:06 AM

## 2023-07-07 NOTE — Group Note (Signed)
 Date:  07/07/2023 Time:  9:20 PM  Group Topic/Focus:  Wrap-Up Group:   The focus of this group is to help patients review their daily goal of treatment and discuss progress on daily workbooks.    Participation Level:  Active  Participation Quality:  Appropriate  Affect:  Appropriate  Cognitive:  Appropriate  Insight: Appropriate  Engagement in Group:  Engaged  Modes of Intervention:  Education and Exploration  Additional Comments:  Patient attended and participated in group tonight.  She reports that she learn her eye sight is not what she though it was.  Carly Brooks 07/07/2023, 9:20 PM

## 2023-07-07 NOTE — Progress Notes (Signed)
   07/06/23 2030  Psych Admission Type (Psych Patients Only)  Admission Status Voluntary  Psychosocial Assessment  Patient Complaints Depression  Eye Contact Brief;Fair  Facial Expression Other (Comment) (smiles)  Affect Appropriate to circumstance  Speech Logical/coherent  Interaction Assertive  Motor Activity Other (Comment) (WDL)  Appearance/Hygiene Unremarkable  Behavior Characteristics Cooperative  Mood Pleasant  Thought Process  Coherency WDL  Content Preoccupation  Delusions None reported or observed  Perception Derealization  Hallucination None reported or observed  Judgment Limited  Confusion None  Danger to Self  Current suicidal ideation? Denies  Self-Injurious Behavior No self-injurious ideation or behavior indicators observed or expressed   Agreement Not to Harm Self Yes  Description of Agreement verbal contract  Danger to Others  Danger to Others None reported or observed

## 2023-07-07 NOTE — Progress Notes (Signed)
   07/07/23 0900  Psych Admission Type (Psych Patients Only)  Admission Status Voluntary  Psychosocial Assessment  Patient Complaints None  Eye Contact Fair  Facial Expression Flat  Affect Appropriate to circumstance  Speech Logical/coherent  Interaction Assertive  Motor Activity Fidgety  Appearance/Hygiene Unremarkable  Behavior Characteristics Cooperative  Mood Pleasant  Thought Process  Coherency WDL  Content Preoccupation  Delusions None reported or observed  Perception WDL  Hallucination None reported or observed  Judgment Limited  Confusion None  Danger to Self  Current suicidal ideation? Denies  Danger to Others  Danger to Others None reported or observed

## 2023-07-08 DIAGNOSIS — F12259 Cannabis dependence with psychotic disorder, unspecified: Secondary | ICD-10-CM | POA: Diagnosis not present

## 2023-07-08 DIAGNOSIS — F200 Paranoid schizophrenia: Secondary | ICD-10-CM | POA: Diagnosis not present

## 2023-07-08 NOTE — Progress Notes (Signed)
 [facility] MD Progress Note  07/08/2023 9:17 AM Carly Brooks  MRN:  [id number]   Reason for Admission:  28 y.o., female patient with PPH of unspecified psychosis with paranoia and delusional thinking as well as cannabis use disorder who presented to ED again with paranoid seeking inpatient psychiatric admission. The patient is currently on Hospital Day 7.   Chart Review from last 24 hours:  The patient's chart was reviewed and nursing notes were reviewed. The patient's case was discussed in multidisciplinary team meeting. Per [facility], patient was taking medications appropriately . The following as needed medications were given: none. Per nursing, pt this AM was in the doorway of room as she took shirt off and paced with pressured movements- was able to be redirected. She attended group sessions.    Information Obtained Today During Patient Interview: The patient was seen in her room resting in bed, no acute distress. On assessment, the patient feels "okay" today.  Patient states that she spoke with her grandfather yesterday on the phone and they talked about [place] fries and her pets.   Patient reports having good sleep, denying issues falling or staying asleep.  Patient reports good appetite. Patient feels that the medications have been helpful with her mood and distressing thoughts.  When asked her to specify she said before she was in the hospital she was scared and confused about going outside because she was afraid of getting arrested and thinking that something bad was going to happen to her family but she no longer feels this way.  She denies adverse effects to her medication including dystonia, akathisia, and EPS symptoms.  Aims exam was negative.  Patient denies current SI, HI, AVH.  She states that when she is discharged she plans on returning to live back home with Mr. Nolon Baxter who she also refers to as her grandfather.    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)    Past Psychiatric History: See H&P  Past Medical History:  Past Medical History:  Diagnosis Date   Anxiety    Cluster B personality disorder (HCC)    Depression    Medical history non-contributory     Past Surgical History:  Procedure Laterality Date   CESAREAN SECTION     CESAREAN SECTION  04/18/2021   CESAREAN SECTION WITH BILATERAL TUBAL LIGATION N/A 04/18/2021   Procedure: CESAREAN SECTION   Family History:  Family History  Problem Relation Age of Onset   Diabetes Mother    Family Psychiatric  History: See H&P Social History: See H&P   Current Medications: Current Facility-Administered Medications  Medication Dose Route Frequency Provider Last Rate Last Admin   acetaminophen  (TYLENOL ) tablet 650 mg  650 mg Oral Q6H PRN Motley-Mangrum, Jadeka A, PMHNP   650 mg at 07/04/23 1737   alum & mag hydroxide-simeth (MAALOX/MYLANTA) 200-200-20 MG/5ML suspension 30 mL  30 mL Oral Q4H PRN Motley-Mangrum, Jadeka A, PMHNP       haloperidol  (HALDOL ) tablet 5 mg  5 mg Oral TID PRN Motley-Mangrum, Jadeka A, PMHNP   5 mg at 07/03/23 1307   And   diphenhydrAMINE  (BENADRYL ) capsule 50 mg  50 mg Oral TID PRN Motley-Mangrum, Jadeka A, PMHNP   50 mg at 07/03/23 1307   haloperidol  lactate (HALDOL ) injection 5 mg  5 mg Intramuscular TID PRN Motley-Mangrum, Jadeka A, PMHNP   5 mg at 07/05/23 [phone]  And   diphenhydrAMINE  (BENADRYL ) injection 50 mg  50 mg Intramuscular TID PRN Motley-Mangrum, Jadeka A, PMHNP   50 mg at 07/05/23 0802   And   LORazepam  (ATIVAN ) injection 2 mg  2 mg Intramuscular TID PRN Motley-Mangrum, Jadeka A, PMHNP   2 mg at 07/05/23 0802   haloperidol  lactate (HALDOL ) injection 10 mg  10 mg Intramuscular TID PRN Motley-Mangrum, Jadeka A, PMHNP       And   diphenhydrAMINE  (BENADRYL ) injection 50 mg  50 mg Intramuscular TID PRN Motley-Mangrum,  Jadeka A, PMHNP       And   LORazepam  (ATIVAN ) injection 2 mg  2 mg Intramuscular TID PRN Motley-Mangrum, Jadeka A, PMHNP       hydrOXYzine  (ATARAX ) tablet 25 mg  25 mg Oral TID PRN Motley-Mangrum, Jadeka A, PMHNP   25 mg at 07/04/23 2118   magnesium  hydroxide (MILK OF MAGNESIA) suspension 30 mL  30 mL Oral Daily PRN Motley-Mangrum, Jadeka A, PMHNP       [START ON 07/10/2023] paliperidone  (INVEGA  SUSTENNA) injection 156 mg  156 mg Intramuscular Q28 days Attiah, Nadir, MD       polyethylene glycol (MIRALAX  / GLYCOLAX ) packet 17 g  17 g Oral Daily Zouev, Dmitri, MD   17 g at 07/08/23 6045   risperiDONE  (RISPERDAL ) tablet 2 mg  2 mg Oral BID Linnie Riches, Nadir, MD   2 mg at 07/08/23 4098   traZODone  (DESYREL ) tablet 50 mg  50 mg Oral QHS Linnie Riches, Nadir, MD   50 mg at 07/07/23 2107    Lab Results:  No results found for this or any previous visit (from the past 48 hours).   Blood Alcohol level:  Lab Results  Component Value Date   ETH <10 06/30/2023   ETH <10 06/09/2023    Metabolic Disorder Labs: Lab Results  Component Value Date   HGBA1C 4.9 06/09/2023   MPG 93.93 06/09/2023   No results found for: "PROLACTIN" Lab Results  Component Value Date   CHOL 154 06/09/2023   TRIG 51 06/09/2023   HDL 43 06/09/2023   CHOLHDL 3.6 06/09/2023   VLDL 10 06/09/2023   LDLCALC 101 (H) 06/09/2023    Physical Findings: AIMS:  , ,  ,  ,    CIWA:    COWS:     Musculoskeletal: Strength & Muscle Tone: within normal limits Gait & Station: normal Patient leans: N/A  Psychiatric Specialty Exam:  Presentation  General Appearance: Appropriate for Environment; Fairly Groomed   Eye Contact:Fair   Speech:Clear and Coherent; Normal Rate   Speech Volume:Normal   Handedness:Right   Mood and Affect  Mood:Euthymic   Affect:Congruent; Appropriate    Thought Process  Thought Processes:Coherent; Goal Directed; Linear   Duration of Psychotic Symptoms: N/A  Past Diagnosis of  Schizophrenia or Psychoactive disorder: No  Descriptions of Associations:Intact   Orientation:Full (Time, Place and Person)   Thought Content:Logical   Hallucinations:Hallucinations: None   Ideas of Reference:None   Suicidal Thoughts:Suicidal Thoughts: No   Homicidal Thoughts:Homicidal Thoughts: No    Sensorium  Memory:Immediate Good; Recent Good; Remote Good   Judgment:Fair   Insight:Fair    Executive Functions  Concentration:Good   Attention Span:Good   Recall:Good   Fund of Knowledge:Good   Language:Good    Psychomotor Activity  Psychomotor Activity:Psychomotor Activity: Normal    Assets  Assets:Communication Skills; Desire for Improvement; Resilience  Physical Exam  General: Pleasant, well-appearing. No acute distress. Pulmonary: Normal effort. No wheezing or rales. Skin: No obvious rash  or lesions. Neuro: A&Ox3.No focal deficit.   Review of Systems  No reported symptoms   Blood pressure 127/84, pulse (!) 108, temperature 98.1 F (36.7 C), resp. rate 18, height 5\' 9"  (1.753 m), weight 120.6 kg, last menstrual period 06/01/2023, SpO2 100%, unknown if currently breastfeeding. Body mass index is 39.25 kg/m.   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 Medications:   Continue risperidone  2 mg twice daily to address  psychosis and monitor for cheeking  Received Invega  Sustenna first injection 4/18, second loading injection on Tuesday 4/22 with plan to discharge her then if continues to do well.  Trazodone  50 mg at bedtime for sleep  Continue as needed Atarax  for anxiety  Continue agitation protocol as needed  --  The risks/benefits/side-effects/alternatives to this medication were discussed in detail with the patient and time was given for questions. The patient consents to medication trial.    -- Metabolic profile and EKG monitoring obtained while on an atypical antipsychotic (BMI: 39.25 Lipid Panel: LDL 101 HbgA1c: 4.9 QTc:403)      3. Pertinent labs: Labwork reviewed      Lab ordered: None   4. Group and Therapy: -- Encouraged patient to participate in unit milieu and in scheduled group therapies   Patient was counseled regarding need to abstain from marijuana after discharge, she agrees.  -- Short Term Goals: Ability to identify changes in lifestyle to reduce recurrence of condition will improve, Ability to verbalize feelings will improve, Ability to disclose and discuss suicidal ideas, Ability to demonstrate self-control will improve, and Ability to identify and develop effective coping behaviors will improve  -- Long Term Goals: Improvement in symptoms so as ready for discharge  6. Discharge Planning:   -- Social work and case management to assist with discharge planning and identification of hospital follow-up needs prior to discharge  -- Estimated LOS: 5-7 days  -- Discharge Concerns: Need to establish a safety plan; Medication compliance and effectiveness  -- Discharge Goals: Return home with outpatient referrals for mental health follow-up including medication management/psychotherapy    Joice Nares, MD 07/08/2023, 9:17 AM

## 2023-07-08 NOTE — Plan of Care (Signed)
   Problem: Education: Goal: Knowledge of [facility] Education information/materials will improve Outcome: Progressing Goal: Emotional status will improve Outcome: Progressing Goal: Mental status will improve Outcome: Progressing Goal: Verbalization of understanding the information provided will improve Outcome: Progressing

## 2023-07-08 NOTE — Progress Notes (Signed)
 Pt awake and alert this morning.  Good eye contact and animated euphoric affect.  Pt denies pain or any issues or problems when asked. Reports that she slept well and ate breakfast.  Does have a slightly pressured tone to voice when answering questions.  Denies SI, Hi or AVH.  Staff will cont to monitor for safety.

## 2023-07-08 NOTE — Plan of Care (Signed)
  Problem: Education: Goal: Mental status will improve Outcome: Progressing Goal: Verbalization of understanding the information provided will improve Outcome: Progressing   Problem: Coping: Goal: Ability to demonstrate self-control will improve Outcome: Progressing   Problem: Education: Goal: Emotional status will improve Outcome: Progressing Goal: Mental status will improve Outcome: Progressing

## 2023-07-08 NOTE — Progress Notes (Signed)
 Patient ID: Carly Brooks, female   DOB: [birth year], 28 y.o.   MRN: [id number] Assumed care of pt at 1300. Pt observed resting quietly on unit at this time. Pt is safe, will con't to monitor.

## 2023-07-08 NOTE — Progress Notes (Addendum)
 At shift onset, approached pt in doorway of room as she took shirt off and paced with pressured movements.  Pt had scarf wrapped around her head. With loud voice tones and pressured speech, pt stated, "I have the right to wear my pashmina, they don't know what it means to me." Pt had to be redirected into room as she attempted to walk into hallway with breasts out and shirt still off.  Diverted pt into talking about her spiritual beliefs, her day and then hobbies.    Pt was able to be redirected and had calmed down. She put shirt back on as she shared that she is spiritual not religious. Pt endorsed enjoying coloring mandalas and doing word searches for therapeutic activities. Pt readjusted demeanor to being appropriate and polite. She was appropriate and med compliant with no further outbursts this night.  Pt rested in bed thereafter without incident.   07/07/23 2100  Psych Admission Type (Psych Patients Only)  Admission Status Voluntary  Psychosocial Assessment  Patient Complaints Anxiety;Restlessness  Eye Contact Fair  Facial Expression Animated  Affect Anxious;Euphoric  Speech Pressured;Logical/coherent  Interaction Guarded;Hypervigilant;Intrusive;Attention-seeking;Assertive  Motor Activity Restless;Posturing  Appearance/Hygiene Unremarkable  Behavior Characteristics Cooperative;Restless  Mood Pleasant;Preoccupied  Thought Process  Coherency Disorganized;Tangential  Content Preoccupation  Delusions Religious  Perception Illusions  Hallucination None reported or observed  Judgment Impaired  Confusion Mild  Danger to Self  Current suicidal ideation? Denies  Self-Injurious Behavior No self-injurious ideation or behavior indicators observed or expressed   Agreement Not to Harm Self Yes  Description of Agreement Verbal  Danger to Others  Danger to Others None reported or observed

## 2023-07-08 NOTE — Progress Notes (Addendum)
 Patient reports 5/10 for anxiety, 3/10 for depression, and 3/10 for hopelessness. She also complains of blurred vision, when asked she said she sees the student-RN blurry. She also states that it comes and goes. Patient denies SI/HI/AVH this morning.  07/08/23 0900  Psych Admission Type (Psych Patients Only)  Admission Status Voluntary  Psychosocial Assessment  Patient Complaints Anxiety;Depression;Hopelessness (Patient rates anxiety 5/10, depression 3/10, and hopelessness 3/10)  Eye Contact Fair  Facial Expression Flat  Affect Sad;Flat  Speech Logical/coherent  Interaction Minimal  Motor Activity Slow  Appearance/Hygiene Unremarkable  Behavior Characteristics Cooperative;Appropriate to situation  Mood Anxious  Thought Process  Coherency WDL  Content Preoccupation  Delusions None reported or observed  Perception WDL  Hallucination None reported or observed  Judgment Impaired  Confusion None  Danger to Self  Current suicidal ideation? Denies  Description of Suicide Plan no plan  Self-Injurious Behavior No self-injurious ideation or behavior indicators observed or expressed   Agreement Not to Harm Self Yes  Description of Agreement verbal

## 2023-07-08 NOTE — Group Note (Signed)
 Date:  07/08/2023 Time:  8:40 PM  Group Topic/Focus:  Wrap-Up Group:   The focus of this group is to help patients review their daily goal of treatment and discuss progress on daily workbooks.    Participation Level:  Did Not Attend  Participation Quality:  n/a  Affect:  n/a  Cognitive:  n/a  Insight: None  Engagement in Group:  n/a  Modes of Intervention:  n/a  Additional Comments:   Pt was encouraged but refused to attend wrap up group  Hektor Huston A Jenifer Struve 07/08/2023, 8:40 PM

## 2023-07-09 DIAGNOSIS — F200 Paranoid schizophrenia: Secondary | ICD-10-CM | POA: Diagnosis not present

## 2023-07-09 DIAGNOSIS — F12259 Cannabis dependence with psychotic disorder, unspecified: Secondary | ICD-10-CM | POA: Diagnosis not present

## 2023-07-09 NOTE — [facility] Suicide Risk Assessment (Signed)
 [facility] INPATIENT:  Family/Significant Other Suicide Prevention Education  Suicide Prevention Education:  Education Completed; Mr. Nolon Baxter (father of patient's children) [phone],  (name of family member/significant other) has been identified by the patient as the family member/significant other with whom the patient will be residing, and identified as the person(s) who will aid the patient in the event of a mental health crisis (suicidal ideations/suicide attempt).  With written consent from the patient, the family member/significant other has been provided the following suicide prevention education, prior to the and/or following the discharge of the patient.  Mr. Nolon Baxter said that he lives with patient and his 2 daughters.  He will pick up patient at discharge.    Mr. Nolon Baxter said that they don't have any guns or weapons.  He will secure medications and sharp objects (such as knives and scissors).  When asked if he has any concerns about patient returning home, he said, no.  When asked if he speaks with patient on the phone, he said yes and added, "she sounds like she is normal."  The suicide prevention education provided includes the following: Suicide risk factors Suicide prevention and interventions National Suicide Hotline telephone number [facility] assessment telephone number [facility] Emergency Assistance 911 [facility] and/or Residential Mobile Crisis Unit telephone number  Request made of family/significant other to: Remove weapons (e.g., guns, rifles, knives), all items previously/currently identified as safety concern.   Remove drugs/medications (over-the-counter, prescriptions, illicit drugs), all items previously/currently identified as a safety concern.  The family member/significant other verbalizes understanding of the suicide prevention education information provided.  The family member/significant other agrees to remove the items of safety concern listed  above.  Deema Juncaj O Lofton Leon, LCSWA 07/09/2023, 7:53 PM

## 2023-07-09 NOTE — Plan of Care (Signed)
  Problem: Safety: Goal: Periods of time without injury will increase Outcome: Progressing   Problem: Coping: Goal: Ability to verbalize frustrations and anger appropriately will improve Outcome: Progressing   Problem: Safety: Goal: Periods of time without injury will increase Outcome: Progressing

## 2023-07-09 NOTE — Progress Notes (Signed)
 Collateral contact - Mr. Nolon Baxter (father of patient's children) - [phone]   CSW left a voicemail.   Fatuma Dowers, LCSWA 07/09/2023

## 2023-07-09 NOTE — Progress Notes (Signed)
 Pt appeared bright and easily engaged in discussion. Pt shared that she was struggling with her period last night when sheets and clothes had to be changed.   Provided pt a brief and feminine hygiene product to insure no accidents tonight. Pt appeared grateful and accepted the intervention.  Pt c/o left achilles heel pain and was given prn meds (see MAR). Pt was med compliant and denied SI/HI/AVH.   07/08/23 2000  Psych Admission Type (Psych Patients Only)  Admission Status Voluntary  Psychosocial Assessment  Patient Complaints Anxiety;Depression (Pt reported anxiety is 5/10 and depression is 2/10)  Eye Contact Fair  Facial Expression Animated  Affect Preoccupied  Speech Logical/coherent  Interaction Assertive  Motor Activity Slow  Appearance/Hygiene Unremarkable;In scrubs  Behavior Characteristics Cooperative;Appropriate to situation;Calm  Mood Pleasant;Euthymic  Thought Process  Coherency WDL  Content Preoccupation  Delusions None reported or observed  Perception WDL  Hallucination None reported or observed  Judgment Impaired  Confusion None  Danger to Self  Current suicidal ideation? Denies  Self-Injurious Behavior No self-injurious ideation or behavior indicators observed or expressed   Agreement Not to Harm Self Yes  Description of Agreement Verbal  Danger to Others  Danger to Others None reported or observed

## 2023-07-09 NOTE — Group Note (Signed)
 Recreation Therapy Group Note   Group Topic:Personal Development  Group Date: 07/09/2023 Start Time: 1020 End Time: 1046 Facilitators: Drena Ham-McCall, LRT,CTRS Location: [address] Day[address]   Group Topic: Personal Development  Goal Area(s) Addresses:  Patient will define what a trigger is. Patient will identify what triggers them. Patient will successfully identify how they can face their triggers.   Intervention: Worksheet  Activity: LRT and patients discussed what triggers were and how they can affect them. Patients were then given a worksheet were they identified the problems their triggers contribute to. Patients then identified triggers for different categories such as people, places, thoughts,etc. Patients would then identify their biggest triggers, how to avoid/reduce exposure to them and how they can face triggers head on.  Education: Geophysicist/field seismologist, Discharge Planning  Education Outcome: Acknowledges education, In group clarification offered, Needs additional education   Affect/Mood: Appropriate   Participation Level: Minimal   Participation Quality: Independent   Behavior: Disinterested   Speech/Thought Process: Delusional   Insight: None   Judgement: None   Modes of Intervention: Worksheet   Patient Response to Interventions:  None   Education Outcome:  In group clarification offered    Clinical Observations/Individualized Feedback: Pt came into group long enough to get the instructions. Pt did say that "sparkles" can be triggers as well. Pt then left and didn't return.      Plan: Continue to engage patient in RT group sessions 2-3x/week.   Ritha Sampedro-McCall, LRT,CTRS  07/09/2023 1:32 PM

## 2023-07-09 NOTE — Group Note (Signed)
 LCSW Group Therapy Note  Group Date: 07/09/2023 Start Time: 1300 End Time: 1400   Type of Therapy:  Group Therapy  Topic:  Coping Skills for Depression and Anxiety  Due to the acuity and complex discharge plans, group was not held. Patient was provided therapeutic worksheets and asked to meet with CSW as needed.   Jacolby Risby O Keir Viernes, LCSWA 07/09/2023  3:46 PM

## 2023-07-09 NOTE — Progress Notes (Signed)
 [facility] MD Progress Note  07/09/2023 8:14 AM Carly Brooks  MRN:  [id number]   Reason for Admission:  28 y.o., female patient with PPH of unspecified psychosis with paranoia and delusional thinking as well as cannabis use disorder who presented to ED again with paranoid seeking inpatient psychiatric admission. The patient is currently on Hospital Day 8.   Chart Review from last 24 hours:  The patient's chart was reviewed and nursing notes were reviewed. The patient's case was discussed in multidisciplinary team meeting. Per [facility], patient was taking medications appropriately . The following as needed medications were given: none. Per nursing, pt continues to have some incident walking out of the room with her shirt off but with further discussions this is not secondary to psychosis and probably related to patient's behavior, patient is redirectable without any episode of agitation or aggression or requirement of as needed medication to be given.   Information Obtained Today During Patient Interview: Patient presents lying down in her bed pleasant and cooperative, smiling appropriately.  Continues to report stable mood denies depressed mood or anxiety except for feeling down on and off "I want to go home" she continues to deny SI HI or AVH, no paranoia or other delusions noted, reports fair sleep and appetite.  She denies side effect of medication and does not appear sleepy, does not display any signs consistent with EPS or TD.  Discussed with patient plan for her to receive second loading dose of Invega  Sustenna injection tomorrow morning then will be discharged home.  She plans to stay with her significant other whom she calls also as grandfather when asked her to clarify this she responds "I do not like usually to talk about it he dated my mother" discussed with staff social work to confirm discharge planning.   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)    Past Psychiatric History: See H&P  Past Medical History:  Past Medical History:  Diagnosis Date   Anxiety    Cluster B personality disorder (HCC)    Depression    Medical history non-contributory     Past Surgical History:  Procedure Laterality Date   CESAREAN SECTION     CESAREAN SECTION  04/18/2021   CESAREAN SECTION WITH BILATERAL TUBAL LIGATION N/A 04/18/2021   Procedure: CESAREAN SECTION   Family History:  Family History  Problem Relation Age of Onset   Diabetes Mother    Family Psychiatric  History: See H&P Social History: See H&P   Current Medications: Current Facility-Administered Medications  Medication Dose Route Frequency Provider Last Rate Last Admin   acetaminophen  (TYLENOL ) tablet 650 mg  650 mg Oral Q6H PRN Motley-Mangrum, Jadeka A, PMHNP   650 mg at 07/08/23 2012   alum & mag hydroxide-simeth (MAALOX/MYLANTA) 200-200-20 MG/5ML suspension 30 mL  30 mL Oral Q4H PRN Motley-Mangrum, Jadeka A, PMHNP       haloperidol  (HALDOL ) tablet 5 mg  5 mg Oral TID PRN Motley-Mangrum, Jadeka A, PMHNP   5 mg at 07/03/23 1307   And   diphenhydrAMINE  (BENADRYL ) capsule 50 mg  50 mg Oral TID PRN Motley-Mangrum, Jadeka A, PMHNP   50 mg at 07/03/23 1307   haloperidol  lactate (HALDOL ) injection 5 mg  5 mg Intramuscular TID PRN Motley-Mangrum, Jadeka A, PMHNP   5 mg at 07/05/23 0802   And   diphenhydrAMINE  (BENADRYL ) injection 50 mg  50 mg  Intramuscular TID PRN Motley-Mangrum, Jadeka A, PMHNP   50 mg at 07/05/23 0802   And   LORazepam  (ATIVAN ) injection 2 mg  2 mg Intramuscular TID PRN Motley-Mangrum, Jadeka A, PMHNP   2 mg at 07/05/23 0802   haloperidol  lactate (HALDOL ) injection 10 mg  10 mg Intramuscular TID PRN Motley-Mangrum, Jadeka A, PMHNP       And   diphenhydrAMINE  (BENADRYL ) injection 50 mg  50 mg Intramuscular TID PRN Motley-Mangrum, Jadeka A, PMHNP        And   LORazepam  (ATIVAN ) injection 2 mg  2 mg Intramuscular TID PRN Motley-Mangrum, Jadeka A, PMHNP       hydrOXYzine  (ATARAX ) tablet 25 mg  25 mg Oral TID PRN Motley-Mangrum, Jadeka A, PMHNP   25 mg at 07/04/23 2118   magnesium  hydroxide (MILK OF MAGNESIA) suspension 30 mL  30 mL Oral Daily PRN Motley-Mangrum, Jadeka A, PMHNP       [START ON 07/10/2023] paliperidone  (INVEGA  SUSTENNA) injection 156 mg  156 mg Intramuscular Q28 days Kain Milosevic, MD       polyethylene glycol (MIRALAX  / GLYCOLAX ) packet 17 g  17 g Oral Daily Zouev, Dmitri, MD   17 g at 07/08/23 7829   risperiDONE  (RISPERDAL ) tablet 2 mg  2 mg Oral BID Linnie Riches, Johntavious Francom, MD   2 mg at 07/08/23 2012   traZODone  (DESYREL ) tablet 50 mg  50 mg Oral QHS Liliane Mallis, MD   50 mg at 07/08/23 2012    Lab Results:  No results found for this or any previous visit (from the past 48 hours).   Blood Alcohol level:  Lab Results  Component Value Date   ETH <10 06/30/2023   ETH <10 06/09/2023    Metabolic Disorder Labs: Lab Results  Component Value Date   HGBA1C 4.9 06/09/2023   MPG 93.93 06/09/2023   No results found for: "PROLACTIN" Lab Results  Component Value Date   CHOL 154 06/09/2023   TRIG 51 06/09/2023   HDL 43 06/09/2023   CHOLHDL 3.6 06/09/2023   VLDL 10 06/09/2023   LDLCALC 101 (H) 06/09/2023    Physical Findings: AIMS: Facial and Oral Movements Muscles of Facial Expression: None Lips and Perioral Area: None Jaw: None Tongue: None,Extremity Movements Upper (arms, wrists, hands, fingers): None Lower (legs, knees, ankles, toes): None, Trunk Movements Neck, shoulders, hips: None, Global Judgements Severity of abnormal movements overall : None Incapacitation due to abnormal movements: None Patient's awareness of abnormal movements: No Awareness, Dental Status Current problems with teeth and/or dentures?: No Does patient usually wear dentures?: No Edentia?: No  CIWA:    COWS:      Musculoskeletal: Strength & Muscle Tone: within normal limits Gait & Station: normal Patient leans: N/A  Psychiatric Specialty Exam:  Presentation  General Appearance: Appropriate for Environment; Fairly Groomed   Eye Contact:Fair   Speech:Clear and Coherent; Normal Rate   Speech Volume:Normal   Handedness:Right   Mood and Affect  Mood:Euthymic   Affect:Congruent; Appropriate    Thought Process  Thought Processes:Coherent; Goal Directed; Linear   Duration of Psychotic Symptoms: N/A  Past Diagnosis of Schizophrenia or Psychoactive disorder: No  Descriptions of Associations:Intact   Orientation:Full (Time, Place and Person)   Thought Content:Logical   Hallucinations:Hallucinations: None   Ideas of Reference:None   Suicidal Thoughts:Suicidal Thoughts: No   Homicidal Thoughts:Homicidal Thoughts: No    Sensorium  Memory:Immediate Good; Recent Good; Remote Good   Judgment:Fair   Insight:Fair    Executive Functions  Concentration:Good  Attention Span:Good   Recall:Good   Fund of Knowledge:Good   Language:Good    Psychomotor Activity  Psychomotor Activity:Psychomotor Activity: Normal    Assets  Assets:Communication Skills; Desire for Improvement; Resilience  Physical Exam  General: Pleasant, well-appearing. No acute distress. Pulmonary: Normal effort. No wheezing or rales. Skin: No obvious rash or lesions. Neuro: A&Ox3.No focal deficit.   Review of Systems  No reported symptoms   Blood pressure 124/79, pulse 84, temperature 97.9 F (36.6 C), resp. rate 18, height 5\' 9"  (1.753 m), weight 120.6 kg, last menstrual period 06/01/2023, SpO2 100%, unknown if currently breastfeeding. Body mass index is 39.25 kg/m.   Treatment Plan Summary: Daily contact with patient to assess and evaluate symptoms and progress in treatment and Medication management  ASSESSMENT:  Diagnosis:    Schizophrenia vs Cannabis-induced  psychotic disorder with moderate or severe use disorder  PLAN: Safety and Monitoring:  -- Voluntary admission to inpatient psychiatric unit for safety, stabilization and treatment  -- Daily contact with patient to assess and evaluate symptoms and progress in treatment  -- Patient's case to be discussed in multi-disciplinary team meeting  -- Observation Level : q15 minute checks  -- Vital signs:  q12 hours  -- Precautions: suicide, elopement, and assault  2. Medications:   Continue risperidone  2 mg twice daily to address psychosis and monitor for cheeking  Received Invega  Sustenna first injection 4/18, second loading injection on Tuesday 4/22 with plan to discharge her then if continues to do well.  Trazodone  50 mg at bedtime for sleep  Continue as needed Atarax  for anxiety  Continue agitation protocol as needed  --  The risks/benefits/side-effects/alternatives to this medication were discussed in detail with the patient and time was given for questions. The patient consents to medication trial.    -- Metabolic profile and EKG monitoring obtained while on an atypical antipsychotic (BMI: 39.25 Lipid Panel: LDL 101 HbgA1c: 4.9 QTc:403)      3. Pertinent labs: Labwork reviewed      Lab ordered: None   4. Group and Therapy: -- Encouraged patient to participate in unit milieu and in scheduled group therapies   Patient was counseled regarding need to abstain from marijuana after discharge, she agrees.  -- Short Term Goals: Ability to identify changes in lifestyle to reduce recurrence of condition will improve, Ability to verbalize feelings will improve, Ability to disclose and discuss suicidal ideas, Ability to demonstrate self-control will improve, and Ability to identify and develop effective coping behaviors will improve  -- Long Term Goals: Improvement in symptoms so as ready for discharge  6. Discharge Planning:   -- Social work and case management to assist with discharge planning  and identification of hospital follow-up needs prior to discharge  -- Estimated LOS: 5-7 days  -- Discharge Concerns: Need to establish a safety plan; Medication compliance and effectiveness  -- Discharge Goals: Return home with outpatient referrals for mental health follow-up including medication management/psychotherapy    Nolita Kutter Linnie Riches, MD 07/09/2023, 8:14 AM

## 2023-07-10 DIAGNOSIS — F200 Paranoid schizophrenia: Secondary | ICD-10-CM | POA: Diagnosis not present

## 2023-07-10 DIAGNOSIS — F12259 Cannabis dependence with psychotic disorder, unspecified: Secondary | ICD-10-CM | POA: Diagnosis not present

## 2023-07-10 MED ORDER — TRAZODONE HCL 100 MG PO TABS: 100.0000 mg | ORAL_TABLET | Freq: Every day | ORAL | Status: DC

## 2023-07-10 MED ORDER — TRAZODONE HCL 150 MG PO TABS
150.0000 mg | ORAL_TABLET | Freq: Every day | ORAL | Status: DC
  Administered 2023-07-10: 150 mg via ORAL
  Filled 2023-07-10 (×3): qty 1

## 2023-07-10 NOTE — Plan of Care (Addendum)
 Pt received agitation protocol X 1 this shift for increasing behavioral outburst. Pt observed slamming her bathroom door multiple times, yelling in her room, threatening to hit her foot with the side rails on her bed. Pt was crying on the floor in her room when approached "I want to go home. I want to see my kids. I want to go home. I rather be in prison than here because they kill you faster in there than here. Let me go to jail". Safety checks maintained at Q 15 minutes intervals. Support, encouragement and reassurance offered.   Problem: Safety: Goal: Periods of time without injury will increase Outcome: Progressing   Problem: Activity: Goal: Sleeping patterns will improve Outcome: Progressing

## 2023-07-10 NOTE — Care Management Important Message (Signed)
 Medicare IM printed and given to social work to give to the patient. ?

## 2023-07-10 NOTE — Care Management Important Message (Signed)
 Patient informed of right to appeal discharge, provided phone number to [facility]. Patient expressed no interest in appealing discharge at this time. CSW will continue to monitor situation.   Dinah Lupa, LCSWA 07/10/2023

## 2023-07-10 NOTE — Progress Notes (Signed)
   07/09/23 2100  Psych Admission Type (Psych Patients Only)  Admission Status Voluntary  Psychosocial Assessment  Patient Complaints Anxiety  Eye Contact Fair  Facial Expression Animated  Affect Other (Comment) (bright)  Speech Logical/coherent  Interaction Attention-seeking;Other (Comment);Guarded (Pt was inappropriate and exposed breasts when peer walking past room.  Pt was able to be redirected and no further incidents this shift.)  Motor Activity Slow  Appearance/Hygiene In scrubs  Behavior Characteristics Impulsive;Pacing  Mood Other (Comment) (Pt appeared bright in mood and affect. Pt shared she had a good day and was proud of the mandala she colored. Pt stated her eye sight wasn't very good when coloring but declined to receive reading glasses. Pt continues to expose breasts to peers.)  Thought Process  Coherency WDL  Content Preoccupation  Delusions None reported or observed  Perception UTA  Hallucination None reported or observed  Judgment Impaired  Confusion None  Danger to Self  Current suicidal ideation? Denies  Self-Injurious Behavior No self-injurious ideation or behavior indicators observed or expressed   Agreement Not to Harm Self Yes  Description of Agreement Verbal  Danger to Others  Danger to Others None reported or observed

## 2023-07-10 NOTE — [facility] Group Notes (Signed)
 [facility] Group Notes:  (Nursing/MHT/Case Management/Adjunct)  Date:  07/10/2023  Time:  8:31 PM  Type of Therapy:   Wrap up group  Participation Level:  Active  Participation Quality:  Appropriate  Affect:  Appropriate  Cognitive:  Alert and Appropriate  Insight:  Appropriate  Engagement in Group:  Engaged  Modes of Intervention:  Discussion  Summary of Progress/Problems:  Tita Form 07/10/2023, 8:31 PM

## 2023-07-10 NOTE — Plan of Care (Signed)
  Problem: Coping: Goal: Ability to verbalize frustrations and anger appropriately will improve Outcome: Progressing   Problem: Safety: Goal: Periods of time without injury will increase Outcome: Progressing   Problem: Activity: Goal: Interest or engagement in activities will improve Outcome: Not Progressing: pt continues to be inappropriate and isolates in room  Goal: Sleeping patterns will improve Outcome: Progressing

## 2023-07-10 NOTE — Progress Notes (Signed)
 [facility] MD Progress Note  07/10/2023 7:25 PM Sherissa Tenenbaum  MRN:  [id number]   Reason for Admission:  28 y.o., female patient with PPH of unspecified psychosis with paranoia and delusional thinking as well as cannabis use disorder who presented to ED again with paranoid seeking inpatient psychiatric admission. The patient is currently on Hospital Day 9.   Chart Review from last 24 hours:  The patient's chart was reviewed and nursing notes were reviewed. The patient's case was discussed in multidisciplinary team meeting. Per [facility], patient was taking medications appropriately .  Patient had behavioral outburst in afternoon after told she can't go home today. Otherwise denying SI, HI or AVH.   Information Obtained Today During Patient Interview: Patient seen for evaluation and states she misses her children and would like to go home.  Denies SI HI or AVH, no paranoia or other delusions noted, reports fair sleep and appetite.  She denies side effect of medication and does not appear sleepy, does not display any signs consistent with EPS or TD. Patient notes difficulty sleeping at night and would like additional medication for sleep at night. Patient received her second LAI loading dose and denies side effects.    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)    Past Psychiatric History: See H&P  Past Medical History:  Past Medical History:  Diagnosis Date   Anxiety    Cluster B personality disorder (HCC)    Depression    Medical history non-contributory     Past Surgical History:  Procedure Laterality Date   CESAREAN SECTION     CESAREAN SECTION  04/18/2021   CESAREAN SECTION WITH BILATERAL TUBAL LIGATION N/A 04/18/2021   Procedure: CESAREAN SECTION   Family History:  Family History  Problem Relation Age of Onset   Diabetes Mother     Family Psychiatric  History: See H&P Social History: See H&P   Current Medications: Current Facility-Administered Medications  Medication Dose Route Frequency Provider Last Rate Last Admin   acetaminophen  (TYLENOL ) tablet 650 mg  650 mg Oral Q6H PRN Motley-Mangrum, Jadeka A, PMHNP   650 mg at 07/10/23 1235   alum & mag hydroxide-simeth (MAALOX/MYLANTA) 200-200-20 MG/5ML suspension 30 mL  30 mL Oral Q4H PRN Motley-Mangrum, Jadeka A, PMHNP       haloperidol  (HALDOL ) tablet 5 mg  5 mg Oral TID PRN Motley-Mangrum, Jadeka A, PMHNP   5 mg at 07/03/23 1307   And   diphenhydrAMINE  (BENADRYL ) capsule 50 mg  50 mg Oral TID PRN Motley-Mangrum, Jadeka A, PMHNP   50 mg at 07/03/23 1307   haloperidol  lactate (HALDOL ) injection 5 mg  5 mg Intramuscular TID PRN Motley-Mangrum, Jadeka A, PMHNP   5 mg at 07/05/23 0802   And   diphenhydrAMINE  (BENADRYL ) injection 50 mg  50 mg Intramuscular TID PRN Motley-Mangrum, Jadeka A, PMHNP   50 mg at 07/05/23 0802   And   LORazepam  (ATIVAN ) injection 2 mg  2 mg Intramuscular TID PRN Motley-Mangrum, Jadeka A, PMHNP   2 mg at 07/05/23 0802   haloperidol  lactate (HALDOL ) injection 10 mg  10 mg Intramuscular TID PRN Motley-Mangrum, Jadeka A, PMHNP   10 mg at 07/10/23 1408   And   diphenhydrAMINE  (BENADRYL ) injection 50 mg  50 mg Intramuscular TID PRN Motley-Mangrum, Jadeka A, PMHNP   50 mg at 07/10/23 1409   And   LORazepam  (ATIVAN ) injection 2  mg  2 mg Intramuscular TID PRN Motley-Mangrum, Jadeka A, PMHNP   2 mg at 07/10/23 1408   hydrOXYzine  (ATARAX ) tablet 25 mg  25 mg Oral TID PRN Motley-Mangrum, Jadeka A, PMHNP   25 mg at 07/09/23 1610   magnesium  hydroxide (MILK OF MAGNESIA) suspension 30 mL  30 mL Oral Daily PRN Motley-Mangrum, Jadeka A, PMHNP       paliperidone  (INVEGA  SUSTENNA) injection 156 mg  156 mg Intramuscular Q28 days Linnie Riches, Nadir, MD   156 mg at 07/10/23 0849   polyethylene glycol (MIRALAX  / GLYCOLAX ) packet 17 g  17 g Oral Daily Ladarryl Wrage, MD   17  g at 07/09/23 9604   traZODone  (DESYREL ) tablet 150 mg  150 mg Oral QHS Ladiamond Gallina, MD        Lab Results:  No results found for this or any previous visit (from the past 48 hours).   Blood Alcohol level:  Lab Results  Component Value Date   ETH <10 06/30/2023   ETH <10 06/09/2023    Metabolic Disorder Labs: Lab Results  Component Value Date   HGBA1C 4.9 06/09/2023   MPG 93.93 06/09/2023   No results found for: "PROLACTIN" Lab Results  Component Value Date   CHOL 154 06/09/2023   TRIG 51 06/09/2023   HDL 43 06/09/2023   CHOLHDL 3.6 06/09/2023   VLDL 10 06/09/2023   LDLCALC 101 (H) 06/09/2023    Physical Findings: AIMS: Facial and Oral Movements Muscles of Facial Expression: None Lips and Perioral Area: None Jaw: None Tongue: None,Extremity Movements Upper (arms, wrists, hands, fingers): None Lower (legs, knees, ankles, toes): None, Trunk Movements Neck, shoulders, hips: None, Global Judgements Severity of abnormal movements overall : None Incapacitation due to abnormal movements: None Patient's awareness of abnormal movements: No Awareness, Dental Status Current problems with teeth and/or dentures?: No Does patient usually wear dentures?: No Edentia?: No  CIWA:    COWS:     Musculoskeletal: Strength & Muscle Tone: within normal limits Gait & Station: normal Patient leans: N/A  Psychiatric Specialty Exam:  Presentation  General Appearance: Appropriate for Environment; Fairly Groomed   Eye Contact:Fair   Speech:Clear and Coherent; Normal Rate   Speech Volume:Normal   Handedness:Right   Mood and Affect  Mood:Euthymic   Affect:Congruent; Appropriate    Thought Process  Thought Processes:Coherent; Goal Directed; Linear   Duration of Psychotic Symptoms: N/A  Past Diagnosis of Schizophrenia or Psychoactive disorder: No  Descriptions of Associations:Intact   Orientation:Full (Time, Place and Person)   Thought  Content:Logical   Hallucinations:denies   Ideas of Reference:None   Suicidal Thoughts:denies   Homicidal Thoughts:denies    Sensorium  Memory:Immediate Good; Recent Good; Remote Good   Judgment:Fair   Insight:Fair    Executive Functions  Concentration:Good   Attention Span:Good   Recall:Good   Fund of Knowledge:Good   Language:Good    Psychomotor Activity  Psychomotor Activity: WNL    Assets  Assets:Communication Skills; Desire for Improvement; Resilience  Physical Exam  General: Pleasant, well-appearing. No acute distress. Pulmonary: Normal effort. No wheezing or rales. Skin: No obvious rash or lesions. Neuro: A&Ox3.No focal deficit.   Review of Systems  No reported symptoms   Blood pressure 104/74, pulse 86, temperature 97.9 F (36.6 C), resp. rate 18, height 5\' 9"  (1.753 m), weight 120.6 kg, last menstrual period 06/01/2023, SpO2 100%, unknown if currently breastfeeding. Body mass index is 39.25 kg/m.   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 Medications:   Stopped risperidone  2 mg twice daily as patient received 2 LAIs  Received Invega  Sustenna first injection 4/18, received second loading injection on Tuesday 4/22   Increase trazodone  to 150 mg at bedtime for sleep  Continue as needed Atarax  for anxiety  Continue  agitation protocol as needed  --  The risks/benefits/side-effects/alternatives to this medication were discussed in detail with the patient and time was given for questions. The patient consents to medication trial.    -- Metabolic profile and EKG monitoring obtained while on an atypical antipsychotic (BMI: 39.25 Lipid Panel: LDL 101 HbgA1c: 4.9 QTc:403)      3. Pertinent labs: Labwork reviewed      Lab ordered: None   4. Group and Therapy: -- Encouraged patient to participate in unit milieu and in scheduled group therapies   Patient was counseled regarding need to abstain from marijuana after discharge, she agrees.  -- Short Term Goals: Ability to identify changes in lifestyle to reduce recurrence of condition will improve, Ability to verbalize feelings will improve, Ability to disclose and discuss suicidal ideas, Ability to demonstrate self-control will improve, and Ability to identify and develop effective coping behaviors will improve  -- Long Term Goals: Improvement in symptoms so as ready for discharge  6. Discharge Planning:   -- Social work and case management to assist with discharge planning and identification of hospital follow-up needs prior to discharge  -- Estimated LOS: 5-7 days  -- Discharge Concerns: Need to establish a safety plan; Medication compliance and effectiveness  -- Discharge Goals: Return home with outpatient referrals for mental health follow-up including medication management/psychotherapy    Mc Hollen, MD 07/10/2023, 7:25 PM

## 2023-07-10 NOTE — [facility] Group Notes (Signed)
 Spiritual care group facilitated by Chaplain Nick Barman, [facility]  Group focused on topic of strength. Group members reflected on what thoughts and feelings emerge when they hear this topic. They then engaged in facilitated dialog around how strength is present in their lives. This dialog focused on representing what strength had been to them in their lives (images and patterns given) and what they saw as helpful in their life now (what they needed / wanted).  Activity drew on narrative framework.  Patient Progress: Carly Brooks attended group for a few moments, but left shortly after and did not return. Chaplain checked in on her later in the afternoon, but she was resting.

## 2023-07-11 ENCOUNTER — Encounter (HOSPITAL_COMMUNITY): Payer: Self-pay

## 2023-07-11 DIAGNOSIS — F200 Paranoid schizophrenia: Secondary | ICD-10-CM | POA: Diagnosis not present

## 2023-07-11 DIAGNOSIS — F12259 Cannabis dependence with psychotic disorder, unspecified: Secondary | ICD-10-CM | POA: Diagnosis not present

## 2023-07-11 MED ORDER — TRAZODONE HCL 150 MG PO TABS: 150.0000 mg | ORAL_TABLET | Freq: Every evening | ORAL | Status: DC | PRN

## 2023-07-11 MED ORDER — OLANZAPINE 10 MG PO TABS
10.0000 mg | ORAL_TABLET | Freq: Every day | ORAL | Status: DC
  Administered 2023-07-11: 10 mg via ORAL
  Filled 2023-07-11 (×2): qty 1

## 2023-07-11 MED ORDER — FLUOXETINE HCL 20 MG PO CAPS
20.0000 mg | ORAL_CAPSULE | Freq: Every day | ORAL | Status: DC
  Administered 2023-07-12: 20 mg via ORAL
  Filled 2023-07-11 (×2): qty 1

## 2023-07-11 NOTE — Plan of Care (Signed)
   Problem: Education: Goal: Knowledge of [facility] Education information/materials will improve Outcome: Progressing   Problem: Activity: Goal: Interest or engagement in activities will improve Outcome: Progressing   Problem: Coping: Goal: Ability to verbalize frustrations and anger appropriately will improve Outcome: Progressing

## 2023-07-11 NOTE — [facility] Group Notes (Signed)
Patient did not attend the Wrap-up group. 

## 2023-07-11 NOTE — Progress Notes (Signed)
   07/10/23 2100  Psych Admission Type (Psych Patients Only)  Admission Status Voluntary  Psychosocial Assessment  Patient Complaints Anxiety  Eye Contact Fair  Facial Expression Animated  Affect Appropriate to circumstance  Speech Logical/coherent  Interaction Assertive  Motor Activity Slow  Appearance/Hygiene In scrubs  Behavior Characteristics Appropriate to situation  Mood Anxious  Thought Process  Coherency WDL  Content Preoccupation  Delusions None reported or observed  Perception WDL  Hallucination None reported or observed  Judgment Impaired  Confusion None  Danger to Self  Current suicidal ideation? Denies  Description of Suicide Plan none  Self-Injurious Behavior No self-injurious ideation or behavior indicators observed or expressed   Agreement Not to Harm Self Yes  Description of Agreement verbal  Danger to Others  Danger to Others None reported or observed

## 2023-07-11 NOTE — BH IP Treatment Plan (Signed)
 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)   Current Medications:  Current Facility-Administered Medications  Medication Dose Route Frequency Provider Last Rate Last Admin   acetaminophen  (TYLENOL ) tablet 650 mg  650 mg Oral Q6H PRN Motley-Mangrum, Jadeka A, PMHNP   650 mg at 07/10/23 1235   alum & mag hydroxide-simeth (MAALOX/MYLANTA) 200-200-20 MG/5ML suspension 30 mL  30 mL Oral Q4H PRN Motley-Mangrum, Jadeka A, PMHNP       haloperidol  (HALDOL ) tablet 5 mg  5 mg Oral TID PRN Motley-Mangrum, Jadeka A, PMHNP   5 mg at 07/03/23 1307   And   diphenhydrAMINE  (BENADRYL ) capsule 50 mg  50 mg Oral TID PRN Motley-Mangrum, Jadeka A, PMHNP   50 mg at 07/03/23 1307   haloperidol  lactate (HALDOL ) injection 5 mg  5 mg Intramuscular TID PRN Motley-Mangrum, Jadeka A, PMHNP   5 mg at 07/05/23 0802   And   diphenhydrAMINE  (BENADRYL ) injection 50 mg  50 mg Intramuscular TID PRN Motley-Mangrum, Jadeka A, PMHNP   50 mg at 07/05/23 0802   And   LORazepam  (ATIVAN ) injection 2 mg  2 mg Intramuscular TID PRN Motley-Mangrum, Jadeka A, PMHNP   2 mg at 07/05/23 0802   haloperidol  lactate (HALDOL ) injection 10 mg  10 mg Intramuscular TID PRN Motley-Mangrum, Jadeka A, PMHNP   10 mg at 07/10/23 1408   And   diphenhydrAMINE  (BENADRYL ) injection 50 mg  50 mg Intramuscular TID PRN Motley-Mangrum, Jadeka A, PMHNP   50 mg at 07/10/23 1409   And   LORazepam  (ATIVAN ) injection 2 mg  2 mg Intramuscular TID PRN Motley-Mangrum, Jadeka A, PMHNP   2 mg at 07/10/23 1408   hydrOXYzine  (ATARAX ) tablet 25 mg  25 mg Oral TID PRN Motley-Mangrum, Jadeka A, PMHNP   25 mg at 07/09/23 0952   magnesium  hydroxide (MILK OF MAGNESIA) suspension  30 mL  30 mL Oral Daily PRN Motley-Mangrum, Jadeka A, PMHNP       paliperidone  (INVEGA  SUSTENNA) injection 156 mg  156 mg Intramuscular Q28 days Linnie Riches, Nadir, MD   156 mg at 07/10/23 0849   polyethylene glycol (MIRALAX  / GLYCOLAX ) packet 17 g  17 g Oral Daily Zouev, Dmitri, MD   17 g at 07/09/23 4098   traZODone  (DESYREL ) tablet 150 mg  150 mg Oral QHS Zouev, Dmitri, MD   150 mg at 07/10/23 2038   PTA Medications: Medications Prior to Admission  Medication Sig Dispense Refill Last Dose/Taking   ARIPiprazole  (ABILIFY ) 20 MG tablet Take 20 mg by mouth daily.   07/01/2023   QUEtiapine  (SEROQUEL ) 200 MG tablet Take 200 mg by mouth at bedtime.   07/01/2023 Bedtime    Patient Stressors: Financial difficulties   Substance abuse   Traumatic event    Patient Strengths: Capable of independent living  Education administrator  Physical Health  Supportive family/friends   Treatment Modalities: Medication Management, Group therapy, Case management,  1 to 1 session with clinician, Psychoeducation, Recreational therapy.   Physician Treatment Plan for Primary Diagnosis: Schizophrenia (HCC) Long Term Goal(s): Improvement in symptoms so as ready for discharge   Short Term Goals: Ability to identify changes in lifestyle to reduce recurrence of condition will improve Ability to verbalize feelings will improve Ability to disclose and discuss suicidal ideas  Ability to demonstrate self-control will improve Ability to identify and develop effective coping behaviors will improve  Medication Management: Evaluate patient's response, side effects, and tolerance of medication regimen.  Therapeutic Interventions: 1 to 1 sessions, Unit Group sessions and Medication administration.  Evaluation of Outcomes: Progressing  Physician Treatment Plan for Secondary Diagnosis: Principal Problem:   Schizophrenia (HCC) Active Problems:   Cannabis-induced psychotic disorder with moderate or severe use  disorder (HCC)  Long Term Goal(s): Improvement in symptoms so as ready for discharge   Short Term Goals: Ability to identify changes in lifestyle to reduce recurrence of condition will improve Ability to verbalize feelings will improve Ability to disclose and discuss suicidal ideas Ability to demonstrate self-control will improve Ability to identify and develop effective coping behaviors will improve     Medication Management: Evaluate patient's response, side effects, and tolerance of medication regimen.  Therapeutic Interventions: 1 to 1 sessions, Unit Group sessions and Medication administration.  Evaluation of Outcomes: Progressing   RN Treatment Plan for Primary Diagnosis: Schizophrenia (HCC) Long Term Goal(s): Knowledge of disease and therapeutic regimen to maintain health will improve  Short Term Goals: Ability to remain free from injury will improve, Ability to demonstrate self-control, Ability to verbalize feelings will improve, Ability to identify and develop effective coping behaviors will improve, and Compliance with prescribed medications will improve  Medication Management: RN will administer medications as ordered by provider, will assess and evaluate patient's response and provide education to patient for prescribed medication. RN will report any adverse and/or side effects to prescribing provider.  Therapeutic Interventions: 1 on 1 counseling sessions, Psychoeducation, Medication administration, Evaluate responses to treatment, Monitor vital signs and CBGs as ordered, Perform/monitor CIWA, COWS, AIMS and Fall Risk screenings as ordered, Perform wound care treatments as ordered.  Evaluation of Outcomes: Progressing   LCSW Treatment Plan for Primary Diagnosis: Schizophrenia (HCC) Long Term Goal(s): Safe transition to appropriate next level of care at discharge, Engage patient in therapeutic group addressing interpersonal concerns.  Short Term Goals: Engage patient in  aftercare planning with referrals and resources, Increase social support, Increase ability to appropriately verbalize feelings, Facilitate acceptance of mental health diagnosis and concerns, and Facilitate patient progression through stages of change regarding substance use diagnoses and concerns  Therapeutic Interventions: Assess for all discharge needs, 1 to 1 time with Social worker, Explore available resources and support systems, Assess for adequacy in community support network, Educate family and significant other(s) on suicide prevention, Complete Psychosocial Assessment, Interpersonal group therapy.  Evaluation of Outcomes: Progressing   Progress in Treatment: Attending groups: Yes. Participating in groups: Yes (some). Taking medication as prescribed: Yes. Toleration medication: Yes. Family/Significant other contact made: Yes, contacted:   Mr. Nolon Baxter (dad) [phone].   Patient understands diagnosis: No Discussing patient identified problems/goals with staff: Yes. Medical problems stabilized or resolved: Yes. Denies suicidal/homicidal ideation: Yes. Issues/concerns per patient self-inventory: No. Other: N/a   New problem(s) identified: No, Describe:  None   New Short Term/Long Term Goal(s): medication stabilization, elimination of SI thoughts, development of comprehensive mental wellness plan.    Patient Goals: "I'm losing my eye sight"   Discharge Plan or Barriers: Patient recently admitted. CSW will continue to follow and assess for appropriate referrals and possible discharge planning.    Reason for Continuation of Hospitalization: Medication stabilization Other; describe Mood stabilization, discharge planning   Estimated Length of Stay: 1-2 days  Last 3 [place] Suicide Severity Risk Score: Flowsheet Row [facility] ([facility] ED  from 06/09/2023 in [facility]  C-SSRS RISK CATEGORY No Risk No Risk No Risk       Last [facility] 2/9 Scores:    05/23/2023   10:48 AM 03/07/2023    4:33 PM 01/22/2023    3:41 PM  Depression screen PHQ 2/9  Decreased Interest 0 2 2  Down, Depressed, Hopeless 0 2 2  PHQ - 2 Score 0 4 4  Altered sleeping 0 2 2  Tired, decreased energy 0 2 2  Change in appetite 0 2 2  Feeling bad or failure about yourself  0 2 2  Trouble concentrating 1 0 2  Moving slowly or fidgety/restless 0 0 0  Suicidal thoughts 0 0 0  PHQ-9 Score 1 12 14   Difficult doing work/chores Not difficult at all Very difficult Somewhat difficult    Scribe for Treatment Team: Vonzell Guerin, LCSWA 07/11/2023 10:04 AM

## 2023-07-11 NOTE — Plan of Care (Signed)
  Problem: Activity: Goal: Sleeping patterns will improve Outcome: Progressing   Problem: Coping: Goal: Ability to verbalize frustrations and anger appropriately will improve Outcome: Progressing Goal: Ability to demonstrate self-control will improve Outcome: Progressing   

## 2023-07-11 NOTE — Group Note (Signed)
 Recreation Therapy Group Note   Group Topic:Coping Skills  Group Date: 07/11/2023 Start Time: 1036 End Time: 1056 Facilitators: Rajesh Wyss-McCall, LRT,CTRS Location: 500 Hall Dayroom   Group Topic: Coping Skills   Goal Area(s) Addresses: Patient will define what a coping skill is. Patient will create a list of healthy coping skills beginning with each letter of the alphabet. Patient will successfully identify positive coping skills they can use post d/c.  Patient will acknowledge benefit(s) of using learned coping skills post d/c.  Intervention: Worksheet   Activity: Coping A to Z. Patient asked to identify what a coping skill is and when they use them. Patients with Clinical research associate discussed healthy versus unhealthy coping skills. Next patients were given a blank worksheet titled "Coping Skills A-Z". Patients were instructed to come up with at least one positive coping skill per letter of the alphabet. Patients were given 15 minutes to brainstorm before ideas were presented to the large group. Patients and LRT debriefed on the importance of coping skill selection based on situation and back-up plans when a skill tried is not effective. At the end of group, patients were given an handout of alphabetized strategies to keep for future reference.   Education: Pharmacologist, Scientist, physiological, Discharge Planning.    Education Outcome: Acknowledges education/Verbalizes understanding/In group clarification offered/Additional education needed   Affect/Mood: N/A   Participation Level: Did not attend    Clinical Observations/Individualized Feedback:     Plan: Continue to engage patient in RT group sessions 2-3x/week.   Jrake Rodriquez-McCall, LRT,CTRS  07/11/2023 1:27 PM

## 2023-07-11 NOTE — Progress Notes (Signed)
 [facility] MD Progress Note  07/11/2023 2:12 PM Carly Brooks  MRN:  [id number]   Reason for Admission:  28 y.o., female patient with PPH of unspecified psychosis with paranoia and delusional thinking as well as cannabis use disorder who presented to ED again with paranoid seeking inpatient psychiatric admission. The patient is currently on Hospital Day 10.   Chart Review from last 24 hours:  The patient's chart was reviewed and nursing notes were reviewed. The patient's case was discussed in multidisciplinary team meeting. Per [facility], patient was taking medications appropriately .  Patient had behavioral outburst in afternoon after told she can't go home today and required IM.   Information Obtained Today During Patient Interview: Patient seen for evaluation and states she misses her children and would like to go home, states she was upset yesterday about not being discharge which is why she got agitated. Adamantly denies thoughts of hurting herself or other. She did express some paranoia about police but is otherwise not voicing delusions. Improved sleep with trazodone  last night. Patient does report feeling depressed with low energy, poor sleep, anhedonia. She denies feeling hopeless or wanting to be dead. Denied SI HI or AVH the last several days. She is future oriented on being outside and touching the grass "with my feet"  She denies side effect of medication and does not appear sleepy, does not display any signs consistent with EPS or TD.   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)    Past Psychiatric History: See H&P  Past Medical History:  Past Medical History:  Diagnosis Date   Anxiety    Cluster B personality disorder (HCC)    Depression    Medical history non-contributory     Past Surgical History:  Procedure Laterality Date    CESAREAN SECTION     CESAREAN SECTION  04/18/2021   CESAREAN SECTION WITH BILATERAL TUBAL LIGATION N/A 04/18/2021   Procedure: CESAREAN SECTION   Family History:  Family History  Problem Relation Age of Onset   Diabetes Mother    Family Psychiatric  History: See H&P Social History: See H&P   Current Medications: Current Facility-Administered Medications  Medication Dose Route Frequency Provider Last Rate Last Admin   acetaminophen  (TYLENOL ) tablet 650 mg  650 mg Oral Q6H PRN Motley-Mangrum, Jadeka A, PMHNP   650 mg at 07/10/23 1235   alum & mag hydroxide-simeth (MAALOX/MYLANTA) 200-200-20 MG/5ML suspension 30 mL  30 mL Oral Q4H PRN Motley-Mangrum, Jadeka A, PMHNP       haloperidol  (HALDOL ) tablet 5 mg  5 mg Oral TID PRN Motley-Mangrum, Jadeka A, PMHNP   5 mg at 07/03/23 1307   And   diphenhydrAMINE  (BENADRYL ) capsule 50 mg  50 mg Oral TID PRN Motley-Mangrum, Jadeka A, PMHNP   50 mg at 07/03/23 1307   haloperidol  lactate (HALDOL ) injection 5 mg  5 mg Intramuscular TID PRN Motley-Mangrum, Jadeka A, PMHNP   5 mg at 07/05/23 0802   And   diphenhydrAMINE  (BENADRYL ) injection 50 mg  50 mg Intramuscular TID PRN Motley-Mangrum, Jadeka A, PMHNP   50 mg at 07/05/23 0802   And   LORazepam  (ATIVAN ) injection 2 mg  2 mg Intramuscular TID PRN Motley-Mangrum, Jadeka A, PMHNP   2 mg at 07/05/23 0802   haloperidol  lactate (HALDOL ) injection 10 mg  10 mg Intramuscular TID PRN Motley-Mangrum, Jadeka A, PMHNP   10 mg at  07/10/23 1408   And   diphenhydrAMINE  (BENADRYL ) injection 50 mg  50 mg Intramuscular TID PRN Motley-Mangrum, Jadeka A, PMHNP   50 mg at 07/10/23 1409   And   LORazepam  (ATIVAN ) injection 2 mg  2 mg Intramuscular TID PRN Motley-Mangrum, Jadeka A, PMHNP   2 mg at 07/10/23 1408   hydrOXYzine  (ATARAX ) tablet 25 mg  25 mg Oral TID PRN Motley-Mangrum, Jadeka A, PMHNP   25 mg at 07/09/23 1478   magnesium  hydroxide (MILK OF MAGNESIA) suspension 30 mL  30 mL Oral Daily PRN Motley-Mangrum,  Jadeka A, PMHNP       OLANZapine  (ZYPREXA ) tablet 10 mg  10 mg Oral QHS Jamese Trauger, MD       paliperidone  (INVEGA  SUSTENNA) injection 156 mg  156 mg Intramuscular Q28 days Linnie Riches, Nadir, MD   156 mg at 07/10/23 0849   polyethylene glycol (MIRALAX  / GLYCOLAX ) packet 17 g  17 g Oral Daily Christophe Rising, MD   17 g at 07/11/23 1107   traZODone  (DESYREL ) tablet 150 mg  150 mg Oral QHS PRN Zebulon Gantt, MD        Lab Results:  No results found for this or any previous visit (from the past 48 hours).   Blood Alcohol level:  Lab Results  Component Value Date   ETH <10 06/30/2023   ETH <10 06/09/2023    Metabolic Disorder Labs: Lab Results  Component Value Date   HGBA1C 4.9 06/09/2023   MPG 93.93 06/09/2023   No results found for: "PROLACTIN" Lab Results  Component Value Date   CHOL 154 06/09/2023   TRIG 51 06/09/2023   HDL 43 06/09/2023   CHOLHDL 3.6 06/09/2023   VLDL 10 06/09/2023   LDLCALC 101 (H) 06/09/2023    Physical Findings: AIMS: Facial and Oral Movements Muscles of Facial Expression: None Lips and Perioral Area: None Jaw: None Tongue: None,Extremity Movements Upper (arms, wrists, hands, fingers): None Lower (legs, knees, ankles, toes): None, Trunk Movements Neck, shoulders, hips: None, Global Judgements Severity of abnormal movements overall : None Incapacitation due to abnormal movements: None Patient's awareness of abnormal movements: No Awareness, Dental Status Current problems with teeth and/or dentures?: No Does patient usually wear dentures?: No Edentia?: No  CIWA:    COWS:     Musculoskeletal: Strength & Muscle Tone: within normal limits Gait & Station: normal Patient leans: N/A  Psychiatric Specialty Exam:  Presentation  General Appearance: Appropriate for Environment; Fairly Groomed   Eye Contact:Fair   Speech:Clear and Coherent; Normal Rate   Speech Volume:Normal   Handedness:Right   Mood and Affect   Mood:Euthymic   Affect:Congruent; Appropriate    Thought Process  Thought Processes:Coherent; Goal Directed; Linear   Duration of Psychotic Symptoms: N/A  Past Diagnosis of Schizophrenia or Psychoactive disorder: No  Descriptions of Associations:Intact   Orientation:Full (Time, Place and Person)   Thought Content:Logical   Hallucinations:denies   Ideas of Reference:None   Suicidal Thoughts:denies   Homicidal Thoughts:denies    Sensorium  Memory:Immediate Good; Recent Good; Remote Good   Judgment:Fair   Insight:Fair    Executive Functions  Concentration:Good   Attention Span:Good   Recall:Good   Fund of Knowledge:Good   Language:Good    Psychomotor Activity  Psychomotor Activity: WNL    Assets  Assets:Communication Skills; Desire for Improvement; Resilience  Physical Exam  General: Pleasant, well-appearing. No acute distress. Pulmonary: Normal effort. No wheezing or rales. Skin: No obvious rash or lesions. Neuro: A&Ox3.No focal deficit.   Review of  Systems  No reported symptoms   Blood pressure 104/74, pulse 86, temperature 97.9 F (36.6 C), resp. rate 18, height 5\' 9"  (1.753 m), weight 120.6 kg, last menstrual period 06/01/2023, SpO2 100%, unknown if currently breastfeeding. Body mass index is 39.25 kg/m.   Treatment Plan Summary: 4/22: plan was to discharge patient tomorrow but she became very agitated and required IM haldol  this afternoon. Will touch base with her significant other. Patient reports poor sleep, some residual delusional constructs. This is her second LAI trial and will continue. Denies SI, HI or AVH. Does not appear floridly manic or psychotic. Will consider adjunct of Olanzapine , but patient may need to transition to Haldol  or Clozapine in the future if she is having any residual symptoms which she now denies.   4/23: patient is calm and cooperative. Has denied SI, HI or AVH. Does reports some depression  with low energy, poor sleep, feeling hopeless at times. Denies wanting to be dead. Patient with some residual paranoia about the police but significant other and patient both deny any safety concerns with plan discharge tomorrow. Discussed r/b/a of starting olanzapine  and prozac  and patient is agreeable.   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 Medications:   Stopped risperidone  2 mg twice daily as patient received 2 LAIs Start olanzapine  10 mg at bedtime for residual delusional constructs about  police Start Prozac  20 mg qdaily for depressive symptoms tomorrow   Received Invega  Sustenna first injection 4/18, received second loading injection on Tuesday 4/22   cont trazodone  to 150 mg at bedtime for sleep  Continue as needed Atarax  for anxiety  Continue agitation protocol as needed  --  The risks/benefits/side-effects/alternatives to this medication were discussed in detail with the patient and time was given for questions. The patient consents to medication trial.    -- Metabolic profile and EKG monitoring obtained while on an atypical antipsychotic (BMI: 39.25 Lipid Panel: LDL 101 HbgA1c: 4.9 QTc:403)      3. Pertinent labs: Labwork reviewed      Lab ordered: None   4. Group and Therapy: -- Encouraged patient to participate in unit milieu and in scheduled group therapies   Patient was  counseled regarding need to abstain from marijuana after discharge, she agrees.  -- Short Term Goals: Ability to identify changes in lifestyle to reduce recurrence of condition will improve, Ability to verbalize feelings will improve, Ability to disclose and discuss suicidal ideas, Ability to demonstrate self-control will improve, and Ability to identify and develop effective coping behaviors will improve  -- Long Term Goals: Improvement in symptoms so as ready for discharge  6. Discharge Planning:   -- Social work and case management to assist with discharge planning and identification of hospital follow-up needs prior to discharge  -- Estimated LOS: tomorrow  -- Discharge Concerns: Need to establish a safety plan; Medication compliance and effectiveness  -- Discharge Goals: Return home with outpatient referrals for mental health follow-up  including medication management/psychotherapy    Edith Groleau, MD 07/11/2023, 2:12 PM

## 2023-07-12 ENCOUNTER — Ambulatory Visit (HOSPITAL_COMMUNITY)
Admission: EM | Admit: 2023-07-12 | Discharge: 2023-07-13 | Disposition: A | Discharge status: Home / Self Care | Attending: Psychiatric/Mental Health | Admitting: Psychiatric/Mental Health

## 2023-07-12 ENCOUNTER — Other Ambulatory Visit: Payer: Self-pay

## 2023-07-12 DIAGNOSIS — F203 Undifferentiated schizophrenia: Secondary | ICD-10-CM | POA: Insufficient documentation

## 2023-07-12 DIAGNOSIS — F319 Bipolar disorder, unspecified: Secondary | ICD-10-CM | POA: Insufficient documentation

## 2023-07-12 DIAGNOSIS — F200 Paranoid schizophrenia: Secondary | ICD-10-CM | POA: Diagnosis not present

## 2023-07-12 DIAGNOSIS — Z6841 Body Mass Index (BMI) 40.0 and over, adult: Secondary | ICD-10-CM | POA: Insufficient documentation

## 2023-07-12 DIAGNOSIS — R45851 Suicidal ideations: Secondary | ICD-10-CM | POA: Insufficient documentation

## 2023-07-12 DIAGNOSIS — Z8659 Personal history of other mental and behavioral disorders: Secondary | ICD-10-CM

## 2023-07-12 DIAGNOSIS — F12259 Cannabis dependence with psychotic disorder, unspecified: Secondary | ICD-10-CM | POA: Diagnosis not present

## 2023-07-12 DIAGNOSIS — Z653 Problems related to other legal circumstances: Secondary | ICD-10-CM | POA: Insufficient documentation

## 2023-07-12 DIAGNOSIS — F333 Major depressive disorder, recurrent, severe with psychotic symptoms: Secondary | ICD-10-CM | POA: Diagnosis not present

## 2023-07-12 DIAGNOSIS — Z79899 Other long term (current) drug therapy: Secondary | ICD-10-CM | POA: Insufficient documentation

## 2023-07-12 DIAGNOSIS — Z5986 Financial insecurity: Secondary | ICD-10-CM | POA: Insufficient documentation

## 2023-07-12 LAB — COMPREHENSIVE METABOLIC PANEL WITH GFR
ALT: 23 U/L (ref 0–44)
AST: 20 U/L (ref 15–41)
Albumin: 3.9 g/dL (ref 3.5–5.0)
Alkaline Phosphatase: 94 U/L (ref 38–126)
Anion gap: 11 (ref 5–15)
BUN: 6 mg/dL (ref 6–20)
CO2: 23 mmol/L (ref 22–32)
Calcium: 9.9 mg/dL (ref 8.9–10.3)
Chloride: 106 mmol/L (ref 98–111)
Creatinine, Ser: 0.79 mg/dL (ref 0.44–1.00)
GFR, Estimated: >60 mL/min (ref >60)
Glucose, Bld: 104 mg/dL — ABNORMAL HIGH (ref 70–99)
Potassium: 4 mmol/L (ref 3.5–5.1)
Sodium: 140 mmol/L (ref 135–145)
Total Bilirubin: 0.4 mg/dL (ref 0.0–1.2)
Total Protein: 7.4 g/dL (ref 6.5–8.1)

## 2023-07-12 LAB — ETHANOL: Alcohol, Ethyl (B): <15 mg/dL (ref <15)

## 2023-07-12 LAB — CBC WITH DIFFERENTIAL/PLATELET
Abs Immature Granulocytes: 0.01 10*3/uL (ref 0.00–0.07)
Basophils Absolute: 0.1 10*3/uL (ref 0.0–0.1)
Basophils Relative: 1 %
Eosinophils Absolute: 0 10*3/uL (ref 0.0–0.5)
Eosinophils Relative: 1 %
HCT: 40.5 % (ref 36.0–46.0)
Hemoglobin: 12.6 g/dL (ref 12.0–15.0)
Immature Granulocytes: 0 %
Lymphocytes Relative: 23 %
Lymphs Abs: 2 10*3/uL (ref 0.7–4.0)
MCH: 25.9 pg — ABNORMAL LOW (ref 26.0–34.0)
MCHC: 31.1 g/dL (ref 30.0–36.0)
MCV: 83.3 fL (ref 80.0–100.0)
Monocytes Absolute: 0.6 10*3/uL (ref 0.1–1.0)
Monocytes Relative: 8 %
Neutro Abs: 5.8 10*3/uL (ref 1.7–7.7)
Neutrophils Relative %: 67 %
Platelets: 335 10*3/uL (ref 150–400)
RBC: 4.86 MIL/uL (ref 3.87–5.11)
RDW: 14.8 % (ref 11.5–15.5)
WBC: 8.6 10*3/uL (ref 4.0–10.5)
nRBC: 0 % (ref 0.0–0.2)

## 2023-07-12 LAB — POCT URINE DRUG SCREEN - MANUAL ENTRY (I-SCREEN)
POC Amphetamine UR: NOT DETECTED
POC Buprenorphine (BUP): NOT DETECTED
POC Cocaine UR: NOT DETECTED
POC Marijuana UR: POSITIVE — AB
POC Methadone UR: NOT DETECTED
POC Methamphetamine UR: NOT DETECTED
POC Morphine: NOT DETECTED
POC Oxazepam (BZO): POSITIVE — AB
POC Oxycodone UR: NOT DETECTED
POC Secobarbital (BAR): NOT DETECTED

## 2023-07-12 LAB — MAGNESIUM: Magnesium: 2 mg/dL (ref 1.7–2.4)

## 2023-07-12 LAB — GLUCOSE, CAPILLARY: Glucose-Capillary: 135 mg/dL — ABNORMAL HIGH (ref 70–99)

## 2023-07-12 LAB — TSH: TSH: 2.632 u[IU]/mL (ref 0.350–4.500)

## 2023-07-12 MED ORDER — DIPHENHYDRAMINE HCL 50 MG/ML IJ SOLN: 50.0000 mg | Freq: Three times a day (TID) | INTRAMUSCULAR | Status: DC | PRN

## 2023-07-12 MED ORDER — ACETAMINOPHEN 325 MG PO TABS
650.0000 mg | ORAL_TABLET | Freq: Four times a day (QID) | ORAL | Status: DC | PRN
  Administered 2023-07-12: 650 mg via ORAL
  Filled 2023-07-12: qty 2

## 2023-07-12 MED ORDER — TRAZODONE HCL 150 MG PO TABS: 150.0000 mg | ORAL_TABLET | Freq: Every evening | ORAL | 0 refills | Status: DC | PRN

## 2023-07-12 MED ORDER — ALUM & MAG HYDROXIDE-SIMETH 200-200-20 MG/5ML PO SUSP: 30.0000 mL | ORAL | Status: DC | PRN

## 2023-07-12 MED ORDER — LORAZEPAM 1 MG PO TABS: 2.0000 mg | ORAL_TABLET | Freq: Four times a day (QID) | ORAL | Status: DC | PRN

## 2023-07-12 MED ORDER — TRAZODONE HCL 50 MG PO TABS
50.0000 mg | ORAL_TABLET | Freq: Every evening | ORAL | Status: DC | PRN
  Administered 2023-07-12: 50 mg via ORAL
  Filled 2023-07-12: qty 1

## 2023-07-12 MED ORDER — HALOPERIDOL LACTATE 5 MG/ML IJ SOLN: 10.0000 mg | Freq: Three times a day (TID) | INTRAMUSCULAR | Status: DC | PRN

## 2023-07-12 MED ORDER — PALIPERIDONE PALMITATE ER 156 MG/ML IM SUSY: 156.0000 mg | PREFILLED_SYRINGE | INTRAMUSCULAR | 2 refills | Status: AC

## 2023-07-12 MED ORDER — HYDROXYZINE HCL 25 MG PO TABS: 25.0000 mg | ORAL_TABLET | Freq: Three times a day (TID) | ORAL | Status: DC | PRN

## 2023-07-12 MED ORDER — MAGNESIUM HYDROXIDE 400 MG/5ML PO SUSP: 30.0000 mL | Freq: Every day | ORAL | Status: DC | PRN

## 2023-07-12 MED ORDER — HALOPERIDOL LACTATE 5 MG/ML IJ SOLN: 5.0000 mg | Freq: Three times a day (TID) | INTRAMUSCULAR | Status: DC | PRN

## 2023-07-12 MED ORDER — POLYETHYLENE GLYCOL 3350 17 G PO PACK: 17.0000 g | PACK | Freq: Every day | ORAL | 0 refills | Status: DC

## 2023-07-12 MED ORDER — HALOPERIDOL 5 MG PO TABS: 5.0000 mg | ORAL_TABLET | Freq: Three times a day (TID) | ORAL | Status: DC | PRN

## 2023-07-12 MED ORDER — FLUOXETINE HCL 20 MG PO CAPS: 20.0000 mg | ORAL_CAPSULE | Freq: Every day | ORAL | 0 refills | Status: DC

## 2023-07-12 MED ORDER — HYDROXYZINE HCL 25 MG PO TABS: 25.0000 mg | ORAL_TABLET | Freq: Three times a day (TID) | ORAL | 0 refills | Status: DC | PRN

## 2023-07-12 MED ORDER — DIPHENHYDRAMINE HCL 50 MG PO CAPS: 50.0000 mg | ORAL_CAPSULE | Freq: Three times a day (TID) | ORAL | Status: DC | PRN

## 2023-07-12 NOTE — Group Note (Signed)
 Recreation Therapy Group Note   Group Topic:Other  Group Date: 07/12/2023 Start Time: 1000 End Time: 1140 Facilitators: Ilyanna Baillargeon-McCall, LRT,CTRS Location: [address] Hall Dayroom   Group Topic: Exercise/Wellness  Goal Area(s) Addresses:  Patient will verbalize benefit of exercise during group session. Patient will identify an exercise that can be completed post d/c. Patient will acknowledge benefits of exercise when used as a coping mechanism.   Intervention: Music  Activity: Patients were to take turns leading the group in the exercises of their choosing.  Education: Physical Activity, Health and Wellness  Education Outcome: Acknowledges understanding/In group clarification offered/Needs additional education.   Clinical Observations/Individualized Feedback: Due acuity on unit, LRT unable to complete group session. LRT assisted on the hall by staying in the dayroom.    Plan: Continue to engage patient in RT group sessions 2-3x/week.   Oakley Orban-McCall, LRT,CTRS 07/12/2023 11:56 AM

## 2023-07-12 NOTE — Discharge Summary (Signed)
 Physician Discharge Summary Note  Patient:  Carly Brooks is an 28 y.o., female MRN:  [id number] DOB:  31-Jan-1996 Patient phone:  [phone] (home)  Patient address:   [address] [place] [address],  Total Time spent with patient: 30 minutes  Date of Admission:  07/01/2023 Date of Discharge: 07/12/23  Reason for Admission:   As per H&P: "28 y.o., female patient with PPH of unspecified psychosis with paranoia and delusional thinking as well as cannabis use disorder who presented to ED again with paranoid seeking inpatient psychiatric admission.   Per NP Nkwenti's psychiatric evaluation at [facility] on 06/29/22: "During encounter with patient, she is more linear, more coherent, and more logical than the presentation on 03/22 when writer saw and assessed her then.  Patient however continues to present with some lingering disorganized thoughts; she states that she is homeless, but is currently residing with her boyfriend, who is also the father of her children, ages 47 and 30 years old.  She tells Clinical research associate "I am naked in pull-ups and with the toddlers."  She reports being discharged from [facility] yesterday, states that she has been taking her medications.  She states to Clinical research associate that she has been homeless x 3 years, going around in her car, states "I am [place]" referring to residing in her car and going around.  She is unsure what medications she is currently on, or what was prescribed to her at [facility].  She denies SI/HI/AVH.  Denies paranoia.  Denies first rank symptoms today. Collateral information from boyfriend: Clinical research associate called patient's boyfriend with whom she resides in an effort to get collateral information, after getting her consent.  Call was placed to the following person, at the following phone number: [name],[name] (Significant other) [phone] (Mobile)  Significant other shares that patient was recently discharged from [facility], and  he has been insuring that she takes her medications on a daily basis, reports that patient is on Seroquel  200 mg nightly, and Abilify  20 mg daily.  He reports that patient remains paranoid, and saying things that "make no sense", and states that he has no family in the area, and is unable to support patient, since he is also responsible for the 2 children, ages 44 and 39 years old.  Mr. Nolon Baxter shares that having patient reside with him is like having a teenager at home, when she is supposed to be his support person.  He shares that he cannot keep residing with patient, and that we should call patient's mother to come and get her from this facility.  He reports that psychosis began last year in August, reports that patient has been complaining of feelings of tiredness after taking the Abilify  and the Seroquel , he shares that it is becoming too much taking care of patient, and he cannot keep doing it.  He reports that patient's only past diagnosis has been borderline personality disorder.  He however is unable to identify any safety concerns with patient, denies that patient has done anything to bring that the kids at danger, denies that patient has threatened danger to herself, himself, or the children since coming home from the hospital.At this time based on information gathered from patient, has significant order, and chart review, it can be concluded at this time that patient's correct diagnosis is Schizophrenia, unspecified at this time. She is in need of outpatient management, which includes medication management, therapy, and she will also benefit from having a community support team  such as the [facility] to assist with outpatient services.  The determination regarding which medications are better for patient's treatment, will be deferred to outpatient provider.  No medication changes will be made at this time.  Case discussed with Dr. Ninette Basque who is in agreement with findings and recommendations above. "   Patient  after that presented to ED again for psych eval.     I saw patient at bedside with social worker for treatment team and she presents with paranoid delusions as well as bizarre delusions. Patient had uncovered her breasts and SW asked her to robe herself. Patient states she is losing her vision and that she came because she she is afraid at home. States she was in a vehicle with her "father" who she later states is her significant other and she saw a helicopter flying over who she believes is after her. Patient states "I'm afraid to wear a hijab now." Patient states she is afraid to go home because there are people after her. Denies AVH. Denies SI or HI. States she was just at [place] [facility] and her medications aren't working for her. States she is afraid to falls asleep.   Patient's significant other provides collateral that patient has had psychosis the past year but it worsened the past several months. Reports patient was at [facility] from March 22 to April 3. Patient received Abilify  maintenna LAI 400 mg qmonthly and has continued taking oral Abilify  20 mg as well as Seroquel  200 mg at bedtime but without any improvement in symptoms "other than making her sleep." Reports patient was sleepy with medications. Reports patient has had paranoia since last year. States she states bizarre things referring 2 daughters as "boys"  and will refer to [facility] as "grandfather or dad even though I've asked her to stop doing that." Refers to a "guy call Jimmy and he's dead." Has not voiced SI or HI. 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)   Past Psychiatric History:  Hospitalizations: was just discharged from [facility] on 4/3 and received Abilify  maintenna 400 mg IM, Abilify  20 mg qdaily and Seroquel  200 mg qhs Suicide attempts: denies Previous medications: Abilify  and Seroquel  just prescribed at [facility] but significant other and patient state they only make her sleepy and having not targeted delusions    Past Medical History:  Past Medical History:  Diagnosis Date   Anxiety    Cluster B personality disorder (HCC)    Depression    Medical history non-contributory     Past Surgical History:  Procedure Laterality Date   CESAREAN SECTION     CESAREAN SECTION  04/18/2021   CESAREAN SECTION WITH BILATERAL TUBAL LIGATION N/A 04/18/2021   Procedure: CESAREAN SECTION   Family History:  Family History  Problem Relation Age of Onset   Diabetes Mother    Family Psychiatric  History:  patient denies   Social History:  Social History   Substance and Sexual Activity  Alcohol Use Not Currently   Alcohol/week: 2.0 standard drinks of alcohol   Types: 2 Glasses of wine per week     Social History  Substance and Sexual Activity  Drug Use Never    Social History   Socioeconomic History   Marital status: Single    Spouse name: Not on file   Number of children: Not on file   Years of education: Not on file   Highest education level: Not on file  Occupational History   Not on file  Tobacco Use   Smoking status: Never   Smokeless tobacco: Never   Tobacco comments:    "I smoke weed everyday"  Vaping Use   Vaping status: Never Used  Substance and Sexual Activity   Alcohol use: Not Currently    Alcohol/week: 2.0 standard drinks of alcohol    Types: 2 Glasses of wine per week   Drug use: Never   Sexual activity: Yes    Partners: Male  Other Topics Concern   Not on file  Social History Narrative   Not on file   Social Drivers of Health   Financial Resource  Strain: High Risk (05/23/2023)   Overall Financial Resource Strain (CARDIA)    Difficulty of Paying Living Expenses: Very hard  Food Insecurity: Food Insecurity Present (07/01/2023)   Hunger Vital Sign    Worried About Running Out of Food in the Last Year: Often true    Ran Out of Food in the Last Year: Often true  Transportation Needs: No Transportation Needs (07/01/2023)   PRAPARE - Administrator, Civil Service (Medical): No    Lack of Transportation (Non-Medical): No  Physical Activity: Inactive (05/23/2023)   Exercise Vital Sign    Days of Exercise per Week: 0 days    Minutes of Exercise per Session: 0 min  Stress: No Stress Concern Present (05/23/2023)   [employer] of Occupational Health - Occupational Stress Questionnaire    Feeling of Stress : Not at all  Social Connections: Moderately Isolated (05/23/2023)   Social Connection and Isolation Panel [NHANES]    Frequency of Communication with Friends and Family: Never    Frequency of Social Gatherings with Friends and Family: Never    Attends Religious Services: More than 4 times per year    Active Member of [employer] or Organizations: Yes    Attends Engineer, structural: More than 4 times per year    Marital Status: Never married    Hospital Course:    Patient was admitted to inpatient psychiatry at [facility] for safety and stabilization. Patient was provided safe and therapeutic milieu, psychiatric and medical assessment, care and treatment, as well as support from nursing, behavioral health staff. Both psychotherapy and psychoeducation groups were provided. Different coping skills such as journaling, CBT and art therapy groups were offered. Additional consultation was provided by hospitalist for H&P and medical needs.  Patient was started on Risperdal   during the admission for treatment resistant schizophrenia (patient had received Abilify  Maintenna LAI 3 weeks prior without significant improvement per her significant  other). Patient was transition to Invega  Sustenna and received loading doses on first injection of 236 mg on 4/18, received second loading injection of 156 on Tuesday 4/22. Patient was provided scripts to receive next maintenance dose on 08/07/23. Patient reported feeling depressed and requested trial of Prozac  and was started on 20 mg qdaily. Patient tolerated without side effects and medications were titrated to therapeutic effect. As patient stabilized on medications and participated in therapeutic interventions, symptoms began to improve. Patient denied SI, HI or AVH for >4 days prior to discharge. She did continue to have some residual  delusional constructs about the possibility of the police being after her but she was not agitated and did not require IM medications for >48 hours prior to discharge. Patient requested discharge and no longer met IVC criteria.  On the day of discharge, the chart was reviewed, case was discussed with staff and the patient was seen in person. Patient's overall mood has improved and . Patient was calm and cooperative and did not appear anxious. Patient reported adequate sleep and stable mood. Patient reports "I just want to go home and feel the grass on my feet." Tolerating medications well. Patient was tolerating medications well without side effects reported or noted. Patient denied suicidal ideation, plan or intent, denied hopelessness, helplessness or worthlessness, and denied homicidal ideation. Insight and judgement have improved. Patient demonstrated future orientation and was motivated to follow-up with aftercare. Patient was encouraged to be adherent with medications. Patient was instructed to call 911, ask for help to go to the closest emergency room or crisis center, call crisis hotlines for help if in critical status or when symptoms were worsening. Patient voiced understanding of this information. At the time of discharge, patient had reached maximum benefit from  hospitalization, was no longer considered to be dangerous to self or others, and was psychiatrically stable and otherwise appropriate for discharge to less restrictive care in the community.   Medical Hospital Course: Patient was seen by the hospitalist for routine admission examination. Medications for chronic conditions were continued. Patient was found to have elevated LFTs in the ER that were downtrending and she was medically cleared. Remained asymptomatic and I recommended she followup with PCP regarding liver enzymes and repeat bloodwork as well as go to the ER if she is having any acute abdominal pain.  Physical Findings: AIMS: Facial and Oral Movements Muscles of Facial Expression: None Lips and Perioral Area: None Jaw: None Tongue: None,Extremity Movements Upper (arms, wrists, hands, fingers): None Lower (legs, knees, ankles, toes): None, Trunk Movements Neck, shoulders, hips: None, Global Judgements Severity of abnormal movements overall : None Incapacitation due to abnormal movements: None Patient's awareness of abnormal movements: No Awareness, Dental Status Current problems with teeth and/or dentures?: No Does patient usually wear dentures?: No Edentia?: No  CIWA:    COWS:     Musculoskeletal: Strength & Muscle Tone: within normal limits Gait & Station: normal Patient leans: N/A   Psychiatric Specialty Exam:  Presentation  General Appearance:  Appropriate for Environment  Eye Contact: Fair  Speech: Clear and Coherent  Speech Volume: Normal  Handedness: Right   Mood and Affect  Mood: Euthymic  Affect: Appropriate   Thought Process  Thought Processes: Goal Directed  Descriptions of Associations:Intact  Orientation:Full (Time, Place and Person)  Thought Content:Logical  History of Schizophrenia/Schizoaffective disorder:Yes  Duration of Psychotic Symptoms:Greater than six months  Hallucinations:Hallucinations: None  Ideas of  Reference:None  Suicidal Thoughts:Suicidal Thoughts: No  Homicidal Thoughts:Homicidal Thoughts: No   Sensorium  Memory: Immediate Fair  Judgment: Fair  Insight: Fair   Art therapist  Concentration: Fair  Attention Span: Fair  Recall: Fair  Fund of Knowledge: Good  Language: Fair   Psychomotor Activity  Psychomotor Activity: Psychomotor Activity: Normal   Assets  Assets: Communication Skills; Intimacy; Housing; Desire for Improvement   Sleep  Sleep: Sleep: Fair    Physical Exam: Physical exam: Please see exam on admit note. General: obese, [place] female Pupils: Normal at 3mm Respiratory: Breathing is unlabored.  Cardiovascular: No edema.  Language: No anomia, no aphasia Muscle  strength and tone-pt moving all extremities.  Gait not assessed as pt remained in bed.  Neuro: Facial muscles are symmetric. Pt without tremor, no evidence of hyperarousal.  Review of Systems  Constitutional: Negative.   HENT: Negative.    Eyes: Negative.   Respiratory: Negative.    Cardiovascular: Negative.   Gastrointestinal: Negative.   Genitourinary: Negative.   Musculoskeletal: Negative.   Skin: Negative.   Neurological: Negative.   Endo/Heme/Allergies: Negative.   Psychiatric/Behavioral: Negative.     Blood pressure 109/60, pulse 81, temperature 97.9 F (36.6 C), resp. rate 18, height 5\' 9"  (1.753 m), weight 120.6 kg, last menstrual period 06/01/2023, SpO2 100%, unknown if currently breastfeeding. Body mass index is 39.25 kg/m.   Social History   Tobacco Use  Smoking Status Never  Smokeless Tobacco Never  Tobacco Comments   "I smoke weed everyday"   Tobacco Cessation:  N/A, patient does not currently use tobacco products and A prescription for an FDA-approved tobacco cessation medication was offered at discharge and the patient refused   Blood Alcohol level:  Lab Results  Component Value Date   [facility] <10 06/30/2023   ETH <10  06/09/2023    Metabolic Disorder Labs:  Lab Results  Component Value Date   HGBA1C 4.9 06/09/2023   MPG 93.93 06/09/2023   No results found for: "PROLACTIN" Lab Results  Component Value Date   CHOL 154 06/09/2023   TRIG 51 06/09/2023   HDL 43 06/09/2023   CHOLHDL 3.6 06/09/2023   VLDL 10 06/09/2023   LDLCALC 101 (H) 06/09/2023    See Psychiatric Specialty Exam and Suicide Risk Assessment completed by Attending Physician prior to discharge.  Discharge destination:  Home  Is patient on multiple antipsychotic therapies at discharge:  No   Has Patient had three or more failed trials of antipsychotic monotherapy by history:  No  Recommended Plan for Multiple Antipsychotic Therapies: NA   Allergies as of 07/12/2023   No Known Allergies      Medication List     STOP taking these medications    ARIPiprazole  20 MG tablet Commonly known as: ABILIFY    QUEtiapine  200 MG tablet Commonly known as: SEROQUEL        TAKE these medications      Indication  FLUoxetine  20 MG capsule Commonly known as: PROZAC  Take 1 capsule (20 mg total) by mouth daily. Start taking on: July 13, 2023  Indication: Depression   hydrOXYzine  25 MG tablet Commonly known as: ATARAX  Take 1 tablet (25 mg total) by mouth 3 (three) times daily as needed for anxiety.  Indication: Feeling Anxious   paliperidone  156 MG/ML Susy injection Commonly known as: INVEGA  SUSTENNA Inject 1 mL (156 mg total) into the muscle every 28 (twenty-eight) days. Patient last received on 07/10/23 and is due for next LAI maintenance dose of 156 mg on 08/07/23 Start taking on: Aug 07, 2023  Indication: Schizophrenia   polyethylene glycol 17 g packet Commonly known as: MIRALAX  / GLYCOLAX  Take 17 g by mouth daily.  Indication: Constipation   traZODone  150 MG tablet Commonly known as: DESYREL  Take 1 tablet (150 mg total) by mouth at bedtime as needed for sleep.  Indication: Trouble Sleeping        Follow-up  Information     [facility] Follow up on 07/18/2023.   Why: You have a hospital follow up appointment for therapy and medication management services on 07/18/23 at 8:30 am.  This will be a Virtual telehealth appointment. Contact information: [address]  [address] [place] [address] [phone]         [facility]. Call.   Why: A referral was sent on 07/12/2023. Please contact the provider on 07/13/2023 at 9 AM for an update on the status of the referral. Contact information: [facility] ([facility]) [address], [place], [place] [phone]                Follow-up recommendations:  Other:  [facility] team referral as above and patient to receive LAI on 08/07/23 Signed: Courtenay Hirth, MD 07/12/2023, 10:17 AM

## 2023-07-12 NOTE — Progress Notes (Signed)
  [facility] Adult Case Management Discharge Plan :  Will you be returning to the same living situation after discharge:  Yes,  patient lives with her children and their father. At discharge, do you have transportation home?: Yes,  Mr. Nolon Baxter (father of patient's children) will pick her up at 12:00 PM Do you have the ability to pay for your medications: Yes,  patient has insurance  Release of information consent forms completed and in the chart;  Patient's signature needed at discharge.  Patient to Follow up at:  Follow-up Information     [facility] Follow up on 07/18/2023.   Why: You have a hospital follow up appointment for therapy and medication management services on 07/18/23 at 8:30 am.  This will be a Virtual telehealth appointment. Contact information: [address]  [address] [place] [address] [phone]         [facility]. Call.   Why: A referral was sent on 07/12/2023. Please contact the provider on 07/13/2023 at 9 AM for an update on the status of the referral. Contact information: [facility] ([facility]) [address], [place], [place] [phone]                Next level of care provider has access to [facility] Link:no  Safety Planning and Suicide Prevention discussed: Yes,  Mr. Nolon Baxter (father of patient's children) [phone]  Has patient been referred to the Quitline?: Patient does not use tobacco/nicotine products  Patient has been referred for addiction treatment: No known substance use disorder.   Daichi Moris O Leith Hedlund, LCSWA 07/12/2023, 9:35 AM

## 2023-07-12 NOTE — ED Notes (Signed)
 Patient observed/assessed in bed/chair resting quietly appearing in no distress and verbalizing no complaints at this time. Will continue to monitor.

## 2023-07-12 NOTE — Progress Notes (Signed)
 Discharge Note:  Patient denies SI/HI/AVH at this time. Discharge instructions, AVS, prescriptions, and transition record gone over with patient. Patient agrees to comply with medication management, follow-up visit, and outpatient therapy. Patient belongings returned to patient. Patient questions and concerns addressed and answered. Patient ambulatory off unit. Patient discharged to home with follow up to [facility].

## 2023-07-12 NOTE — Plan of Care (Signed)
  Problem: Coping: Goal: Ability to verbalize frustrations and anger appropriately will improve Outcome: Progressing Goal: Ability to demonstrate self-control will improve Outcome: Progressing   Problem: Health Behavior/Discharge Planning: Goal: Identification of resources available to assist in meeting health care needs will improve Outcome: Progressing Goal: Compliance with treatment plan for underlying cause of condition will improve Outcome: Progressing

## 2023-07-12 NOTE — Progress Notes (Signed)
 Recreation Therapy Notes  INPATIENT RECREATION TR PLAN  Patient Details Name: Carly Brooks MRN: [id number] DOB: April 21, 1995 Today's Date: 07/12/2023  Rec Therapy Plan Is patient appropriate for Therapeutic Recreation?: Yes Treatment times per week: about 3 days Estimated Length of Stay: 5-7 days TR Treatment/Interventions: Group participation (Comment)  Discharge Criteria Pt will be discharged from therapy if:: Discharged Treatment plan/goals/alternatives discussed and agreed upon by:: Patient/family  Discharge Summary Short term goals set: She patient care plan Short term goals met: Adequate for discharge Progress toward goals comments: Groups attended Which groups?: Self-esteem, Other (Comment) (Personal Development) Reason goals not met: Pt identified one way of expressing herself. Therapeutic equipment acquired: N/A Reason patient discharged from therapy: Discharge from hospital Pt/family agrees with progress & goals achieved: Yes Date patient discharged from therapy: 07/12/23   Barrie Sigmund-McCall, LRT,CTRS Kylin Dubs A Analyse Angst-McCall 07/12/2023, 11:09 AM

## 2023-07-12 NOTE — BHH Suicide Risk Assessment (Signed)
 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)   Total Time spent with patient: 30 minutes  Musculoskeletal: Strength & Muscle Tone: within normal limits Gait & Station: normal Patient leans: N/A  Psychiatric Specialty Exam  Presentation  General Appearance:  Appropriate for Environment  Eye Contact: Fair  Speech: Clear and Coherent  Speech Volume: Normal  Handedness: Right   Mood and Affect  Mood: Euthymic  Duration of Depression Symptoms: Greater than two weeks  Affect: Appropriate   Thought Process  Thought Processes: Goal Directed  Descriptions of Associations:Intact  Orientation:Full (Time, Place and Person)  Thought Content:Logical  History of Schizophrenia/Schizoaffective disorder:Yes  Duration of Psychotic Symptoms:Greater than six months  Hallucinations:Hallucinations: None  Ideas of Reference:None  Suicidal Thoughts:Suicidal Thoughts: No  Homicidal Thoughts:Homicidal Thoughts: No   Sensorium  Memory: Immediate Fair  Judgment: Fair  Insight: Fair   Art therapist  Concentration: Fair  Attention Span: Fair  Recall: Fair  Fund of Knowledge: Good  Language: Fair   Psychomotor Activity  Psychomotor Activity: Psychomotor Activity: Normal   Assets  Assets: Communication Skills; Intimacy; Housing; Desire for Improvement   Sleep  Sleep: Sleep: Fair   Physical Exam: Physical Exam ROS Blood pressure 109/60, pulse 81, temperature 97.9 F (36.6 C), resp. rate 18, height 5\' 9"  (1.753 m), weight 120.6 kg, last menstrual period 06/01/2023, SpO2 100%, unknown if currently breastfeeding. Body mass index is 39.25 kg/m.  Mental Status Per Nursing Assessment::   On Admission:  NA  Demographic Factors:  Low  socioeconomic status and Unemployed  Loss Factors: Decrease in vocational status  Historical Factors: NA  Risk Reduction Factors:   Responsible for children under 64 years of age, Sense of responsibility to family, Religious beliefs about death, Living with another person, especially a relative, Positive social support, and Positive coping skills or problem solving skills  Continued Clinical Symptoms:  Previous Psychiatric Diagnoses and Treatments  Cognitive Features That Contribute To Risk:  None    Suicide Risk:  Minimal: No identifiable suicidal ideation.  Patients presenting with no risk factors but with morbid ruminations; may be classified as minimal risk based on the severity of the depressive symptoms   Follow-up Information     [facility] Follow up on 07/18/2023.   Why: You have a hospital follow up appointment for therapy and medication management services on 07/18/23 at 8:30 am.  This will be a Virtual telehealth appointment. Contact information: [address]  [address] [place] [address] [phone]         [facility]. Call.   Why: A referral was sent on 07/12/2023. Please contact the provider on 07/13/2023 at 9 AM for an update on the status of the referral. Contact information: [facility] [employer] [facility] ([facility]) [address], [place], [place] [phone]                Plan Of Care/Follow-up recommendations:  Other:  appointments as above  Dacota Devall, MD 07/12/2023, 10:41 AM

## 2023-07-12 NOTE — Progress Notes (Signed)
 Collateral contact - [facility] - Genta Hughes [email or web address]  A referral was emailed via secure email.   Kristan Brummitt, LCSWA 07/12/2023

## 2023-07-12 NOTE — Plan of Care (Signed)
 Patient was able to identify some positive traits about herself during group session. Patient expressed one way of expressing herself was through staying true to herself. Patient left group session before processing.  Caraline Deutschman-McCall, LRT,CTRS

## 2023-07-12 NOTE — Progress Notes (Signed)
   07/12/23 1845  BHUC Triage Screening (Walk-ins at [facility] only)  What Is the Reason for Your Visit/Call Today? Pt present to [facility] voluntarily via [employer]. Pt says she needs somewhere to stay to help her stop hearing voices. Pt says she wants to go home to her nursing home. Pt says she endorsed SI most recently 30 minutes ago with no plan. Pt denies HI, VH. Pt says she is hearing voices telling her to be afraid of the police, there are planes in the air, and that they are out to get her. Pt says she smoked 1/2 a blunt of marijuana two hours ago. Pt states seh is diagnosed with bipolar and schizophrenia. Per [employer] pt does not live in a nursing home, pt was just discharged from [facility] for SI and has possible delusions.  How Long Has This Been Causing You Problems? 1-6 months  Have You Recently Had Any Thoughts About Hurting Yourself? Yes  How long ago did you have thoughts about hurting yourself? 30 minutes ago with no plan  Are You Planning to Commit Suicide/Harm Yourself At This time? No  Have you Recently Had Thoughts About Hurting Someone Marigene Shoulder? No  Are You Planning To Harm Someone At This Time? No  Are you currently experiencing any auditory, visual or other hallucinations? Yes  Please explain the hallucinations you are currently experiencing: AH, telling her to be afraid of the police, there are planes in the air, and that they are out to get her  Have You Used Any Alcohol or Drugs in the Past 24 Hours? Yes  What Did You Use and How Much? 1/2 blunt marijuana two hours ago  Do you have any current medical co-morbidities that require immediate attention? No  Clinician description of patient physical appearance/behavior: calm, avoiding eye contact  What Do You Feel Would Help You the Most Today? Treatment for Depression or other mood problem  If access to [facility] Ur[facility] was not available, would you have sought care in the Emergency Department? No  Determination of Need Routine (7 days)  Options For Referral  Outpatient Therapy;Intensive Outpatient Therapy;Medication Management

## 2023-07-13 DIAGNOSIS — F319 Bipolar disorder, unspecified: Secondary | ICD-10-CM | POA: Diagnosis not present

## 2023-07-13 DIAGNOSIS — F203 Undifferentiated schizophrenia: Secondary | ICD-10-CM

## 2023-07-13 DIAGNOSIS — Z8659 Personal history of other mental and behavioral disorders: Secondary | ICD-10-CM

## 2023-07-13 DIAGNOSIS — F333 Major depressive disorder, recurrent, severe with psychotic symptoms: Secondary | ICD-10-CM | POA: Diagnosis not present

## 2023-07-13 DIAGNOSIS — Z6841 Body Mass Index (BMI) 40.0 and over, adult: Secondary | ICD-10-CM

## 2023-07-13 NOTE — Progress Notes (Signed)
 Pt was referred to the following facilities:   Destination  Service Provider Address Phone Fax  [facility] [address], [place] [address] [phone]  [facility] [address] [place], [place] [address] [phone]  [facility] [address] [place] [address] [phone]  [facility] [address], [place] [address] [phone]  [facility] [address] [place] [place] [address] [phone]  [facility] [address] [place] [address] [phone]  [facility] [address] [place] [address] [phone]  [facility] [address] [place], [place] [address] [phone]  [facility] [address] [place] [address] [phone]  [facility] [address] [place] [address] [phone]  [facility] [address], [place] [address] [phone]  [facility] [address], [place] [address] [phone]  [facility] [address] [place] [address] [phone]  [facility] [address], [place] [address] [phone]  Internal Comment last updated by Mauri Sous 07/13/2023 [address]  [phone]

## 2023-07-13 NOTE — ED Notes (Signed)
 Patient discharged in no acute distress. Transported by [employer]. Updated vitals provided to [facility].

## 2023-07-13 NOTE — ED Notes (Signed)
 [facility] paged to give report.
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
## 2023-07-13 NOTE — ED Notes (Addendum)
 Patient denies SI/HI or AVH. She is currently observed resting in her lounger. Patient has disrobed on the unit multiple times, however remains compliant when asked to put her clothes on. She denies any additional needs at this time. We will continue to monitor for safety.

## 2023-07-13 NOTE — Discharge Instructions (Addendum)
 Pt transferring to [facility] for inpatient treatment.

## 2023-07-13 NOTE — Progress Notes (Addendum)
 Pt was accepted to [facility] on 07/13/2023  Pt meets inpatient criteria per:  Davia Erps, NP  Attending Physician will be:  Lavona Pounds MD  Report can be called to:  [phone]  Pt can arrive any time today  Care Team Notified:  Davia Erps, NP,  Charmayne Cooper, RN   [address], [facility]

## 2023-07-13 NOTE — ED Provider Notes (Signed)
 FBC/OBS ASAP Discharge Summary  Date and Time: 07/13/2023 1:04 PM  Name: Carly Brooks  MRN:  [id number]   Discharge Diagnoses:  Final diagnoses:  Severe episode of recurrent major depressive disorder, with psychotic features (HCC) [F33.3]  Hx of bipolar disorder [Z86.59]  Undifferentiated schizophrenia (HCC) [F20.3]  Morbid obesity with BMI of 40.0-44.9, adult (HCC) [E66.01, Z68.41]    Subjective: Carly Brooks 28 y.o., female patient presented to [facility] as a voluntary walk in accompanied by [employer] with complaints of auditory hallucinations, bizarre behavior and suicidal ideations.Carly Brooks, is seen face to face by this provider, consulted with Dr. Docia Freeman; and chart reviewed on 07/13/23.  Per chart review, patient was discharged from [facility] on July 12, 2023.  She was discharged with the following medications: Fluoxetine  20 mg daily, Invega  Sustenna LAI 156 mg (next dose due Aug 07, 2023), as needed hydroxyzine  25 mg and as needed trazodone  150 mg.  Patient reports smoking marijuana after discharging yesterday.   On evaluation Carly Brooks does not verbally respond to assessment questions but shakes head "no" when asked about suicidal ideations and homicidal ideations.  She shakes her head " yes" when asked about visual and auditory hallucinations however does not express or verbalize what does consist of.  Throughout the day patient has been observed crawling on the floor, sitting on her knees in the middle of the milieu, rolling around on the floor nude, redirected to the bathroom and asked to put clothes on multiple times, and walking on knees in the bathroom.  Patient still continues to display bizarre behaviors.  Inform patient that the plan will be for her to receive inpatient hospitalization for which she responds "OK".   During evaluation [name] is in the bathroom nude and being redirected to put clothes back on upon assessment.  She  is alert & oriented, cooperative but distracted for this assessment.   Her mood is depressed with flat affect.  She has minimal speech, and bizarre and disrobing behaviors.  Pt does appear to be responding to internal or external stimuli and is visibly internally preoccupied with disorganized thoughts.   She currently denies suicidal/self-harm/homicidal ideation.  She endorses having hallucinations but does not explain what type or what these hallucinations consistently.  Patient is minimally engaged throughout this assessment.   Stay Summary: 07/13/2023  Cicero Crawley, NP         Carly Brooks is a 28 year old female with a known diagnosis of schizophrenia who presents to behavioral health urgent care after being brought in by police. She is disoriented and unable to state her current location or where she was picked up from. She reports that the police and helicopters were trying to help her locate her home, which she believes is [facility]. She refers to this facility, and others like it, as "nursing homes," suggesting a distorted sense of reality and place. She was just discharged from [facility] yesterday following an 11-day admission for similar symptoms, including auditory and visual hallucinations, disorganized behavior (e.g., walking around naked), and delusional thinking. She admits to not taking her prescribed medications, and endorses continued use of marijuana and cigarettes, though she denies alcohol use. The patient currently endorses auditory hallucinations and visual hallucinations, stating she sees ancestors and deceased relatives. She reports suicidal ideation linked to being unable to find her "home," though her concept of "home" is non-specific and rooted in her delusions. She denies homicidal ideation and denies access  to weapons. She is unaware of the current date, incorrectly identifying the year as 2023, and believes she has a future court date on  March 7th despite already missing that date, indicating poor insight and cognitive disorientation. Collateral from her significant other indicates that the patient was also recently discharged from [facility], marking her third psychiatric hospitalization in two months. He reports she remains paranoid, speaks nonsensically, and that he is unable to care for their shared two-year-old daughter due to the patient's erratic behaviors. She is currently prescribed Abilify  Maintena 400 mg monthly, Abilify  20 mg daily, and Seroquel  200 mg, but continues to experience active psychosis, suggesting either nonadherence or medication ineffectiveness. She is not currently under guardianship. Patient is recommended for inpatient hospitalization for medication management and psychiatric stability   Total Time spent with patient: 20 minutes  Past Psychiatric History: Schizophrenia and depression Past Medical History: Kidney stones Family History: None reported Family Psychiatric History: None reported Social History:  Tobacco Cessation:  N/A, patient does not currently use tobacco products  Current Medications:  Current Facility-Administered Medications  Medication Dose Route Frequency Provider Last Rate Last Admin   acetaminophen  (TYLENOL ) tablet 650 mg  650 mg Oral Q6H PRN Al Alias, NP   650 mg at 07/12/23 2208   alum & mag hydroxide-simeth (MAALOX/MYLANTA) 200-200-20 MG/5ML suspension 30 mL  30 mL Oral Q4H PRN Al Alias, NP       haloperidol  (HALDOL ) tablet 5 mg  5 mg Oral TID PRN Al Alias, NP       And   diphenhydrAMINE  (BENADRYL ) capsule 50 mg  50 mg Oral TID PRN Al Alias, NP       haloperidol  lactate (HALDOL ) injection 5 mg  5 mg Intramuscular TID PRN Al Alias, NP       And   diphenhydrAMINE  (BENADRYL ) injection 50 mg  50 mg Intramuscular TID PRN Al Alias, NP       And   LORazepam  (ATIVAN ) tablet 2 mg  2 mg Oral Q6H PRN Al Alias,  NP       haloperidol  lactate (HALDOL ) injection 10 mg  10 mg Intramuscular TID PRN Al Alias, NP       And   diphenhydrAMINE  (BENADRYL ) injection 50 mg  50 mg Intramuscular TID PRN Al Alias, NP       And   LORazepam  (ATIVAN ) tablet 2 mg  2 mg Oral Q6H PRN Al Alias, NP       hydrOXYzine  (ATARAX ) tablet 25 mg  25 mg Oral TID PRN Al Alias, NP       magnesium  hydroxide (MILK OF MAGNESIA) suspension 30 mL  30 mL Oral Daily PRN Al Alias, NP       traZODone  (DESYREL ) tablet 50 mg  50 mg Oral QHS PRN Al Alias, NP   50 mg at 07/12/23 2209   Current Outpatient Medications  Medication Sig Dispense Refill   FLUoxetine  (PROZAC ) 20 MG capsule Take 1 capsule (20 mg total) by mouth daily. 30 capsule 0   hydrOXYzine  (ATARAX ) 25 MG tablet Take 1 tablet (25 mg total) by mouth 3 (three) times daily as needed for anxiety. 30 tablet 0   [START ON 08/07/2023] paliperidone  (INVEGA  SUSTENNA) 156 MG/ML SUSY injection Inject 1 mL (156 mg total) into the muscle every 28 (twenty-eight) days. Patient last received on 07/10/23 and is due for next LAI maintenance dose of 156 mg on 08/07/23  1 mL 2   polyethylene glycol (MIRALAX  / GLYCOLAX ) 17 g packet Take 17 g by mouth daily. 30 packet 0   traZODone  (DESYREL ) 150 MG tablet Take 1 tablet (150 mg total) by mouth at bedtime as needed for sleep. 30 tablet 0    PTA Medications:  Facility Ordered Medications  Medication   acetaminophen  (TYLENOL ) tablet 650 mg   alum & mag hydroxide-simeth (MAALOX/MYLANTA) 200-200-20 MG/5ML suspension 30 mL   magnesium  hydroxide (MILK OF MAGNESIA) suspension 30 mL   haloperidol  (HALDOL ) tablet 5 mg   And   diphenhydrAMINE  (BENADRYL ) capsule 50 mg   haloperidol  lactate (HALDOL ) injection 5 mg   And   diphenhydrAMINE  (BENADRYL ) injection 50 mg   And   LORazepam  (ATIVAN ) tablet 2 mg   haloperidol  lactate (HALDOL ) injection 10 mg   And   diphenhydrAMINE  (BENADRYL ) injection 50 mg   And    LORazepam  (ATIVAN ) tablet 2 mg   hydrOXYzine  (ATARAX ) tablet 25 mg   traZODone  (DESYREL ) tablet 50 mg   PTA Medications  Medication Sig   hydrOXYzine  (ATARAX ) 25 MG tablet Take 1 tablet (25 mg total) by mouth 3 (three) times daily as needed for anxiety.   [START ON 08/07/2023] paliperidone  (INVEGA  SUSTENNA) 156 MG/ML SUSY injection Inject 1 mL (156 mg total) into the muscle every 28 (twenty-eight) days. Patient last received on 07/10/23 and is due for next LAI maintenance dose of 156 mg on 08/07/23   polyethylene glycol (MIRALAX  / GLYCOLAX ) 17 g packet Take 17 g by mouth daily.   traZODone  (DESYREL ) 150 MG tablet Take 1 tablet (150 mg total) by mouth at bedtime as needed for sleep.   FLUoxetine  (PROZAC ) 20 MG capsule Take 1 capsule (20 mg total) by mouth daily.       05/23/2023   10:48 AM 03/07/2023    4:33 PM 01/22/2023    3:41 PM  Depression screen PHQ 2/9  Decreased Interest 0 2 2  Down, Depressed, Hopeless 0 2 2  PHQ - 2 Score 0 4 4  Altered sleeping 0 2 2  Tired, decreased energy 0 2 2  Change in appetite 0 2 2  Feeling bad or failure about yourself  0 2 2  Trouble concentrating 1 0 2  Moving slowly or fidgety/restless 0 0 0  Suicidal thoughts 0 0 0  PHQ-9 Score 1 12 14   Difficult doing work/chores Not difficult at all Very difficult Somewhat difficult    Flowsheet Row ED from 07/12/2023 in [facility] (Discharged[facility] from 06/30/2023 in [facility] at [facility]  C-SSRS RISK CATEGORY No Risk No Risk No Risk       Musculoskeletal  Strength & Muscle Tone: within normal limits Gait & Station: normal Patient leans: N/A  Psychiatric Specialty Exam  Presentation  General Appearance:  Bizarre  Eye Contact: Minimal  Speech: Blocked  Speech Volume: Decreased  Handedness: Right   Mood and Affect  Mood: Depressed;  Labile  Affect: Inappropriate; Flat   Thought Process  Thought Processes: Disorganized  Descriptions of Associations:Loose  Orientation:Full (Time, Place and Person)  Thought Content:Delusions; Paranoid Ideation; Scattered  Diagnosis of Schizophrenia or Schizoaffective disorder in past: Yes  Duration of Psychotic Symptoms: Greater than six months   Hallucinations:Hallucinations: Auditory; Visual Description of Auditory Hallucinations: hears voices telling her to kill her self Description of Visual Hallucinations: Sees "ancestors and family memebers"  Ideas of Reference:Paranoia; Delusions  Suicidal Thoughts:Suicidal Thoughts: No SI Active Intent and/or Plan: With Intent; Without Plan  Homicidal Thoughts:Homicidal Thoughts: No   Sensorium  Memory: Immediate Poor; Remote Poor  Judgment: Poor  Insight: Lacking   Executive Functions  Concentration: Poor  Attention Span: Poor  Recall: Poor  Fund of Knowledge: Poor  Language: Fair   Psychomotor Activity  Psychomotor Activity: Psychomotor Activity: Restlessness   Assets  Assets: Desire for Improvement; Financial Resources/Insurance; Housing; Physical Health; Resilience   Sleep  Sleep: Sleep: Poor   Nutritional Assessment (For OBS and FBC admissions only) Has the patient had a decrease in food intake/or appetite?: No Does the patient have dental problems?: No Does the patient have eating habits or behaviors that may be indicators of an eating disorder including binging or inducing vomiting?: No Has the patient recently lost weight without trying?: 0 Has the patient been eating poorly because of a decreased appetite?: 0 Malnutrition Screening Tool Score: 0    Physical Exam  Physical Exam Vitals and nursing note reviewed.  Constitutional:      Appearance: Normal appearance.  HENT:     Head: Normocephalic.     Nose: Nose normal.  Cardiovascular:     Rate and Rhythm: Normal rate.   Pulmonary:     Effort: Pulmonary effort is normal.  Musculoskeletal:        General: Normal range of motion.     Cervical back: Normal range of motion.  Neurological:     Mental Status: She is alert and oriented to person, place, and time.    Review of Systems  Constitutional: Negative.   HENT: Negative.    Eyes: Negative.   Respiratory: Negative.    Cardiovascular: Negative.   Gastrointestinal: Negative.   Genitourinary: Negative.   Musculoskeletal: Negative.   Neurological: Negative.   Endo/Heme/Allergies: Negative.   Psychiatric/Behavioral:  Positive for depression and hallucinations.    Blood pressure 120/88, pulse 79, temperature 97.9 F (36.6 C), temperature source Oral, resp. rate 18, last menstrual period 06/01/2023, SpO2 99%, unknown if currently breastfeeding. There is no height or weight on file to calculate BMI.  D Plan Of Care/Follow-up recommendations:  Patient is recommended for inpatient psychiatric hospitalization for mood stabilization and safety.  Patient is currently voluntary and agrees to this plan.  Disposition: Patient accepted at [facility], pending transport.  Davia Erps, NP 07/13/2023, 1:04 PM

## 2023-07-13 NOTE — ED Provider Notes (Signed)
 [facility] Continuous Assessment Admission H&P  Date: 07/13/23 Patient Name: Carly Brooks MRN: [id number] Chief Complaint: "I can't find my house, the police and helicopters were helping me."  Diagnoses:  Final diagnoses:  Severe episode of recurrent major depressive disorder, with psychotic features (HCC) [F33.3]  Hx of bipolar disorder [Z86.59]  Undifferentiated schizophrenia (HCC) [F20.3]  Morbid obesity with BMI of 40.0-44.9, adult (HCC) [E66.01, Z68.41]    HPI: Carly Brooks is a 28 year old female with a known diagnosis of schizophrenia who presents to behavioral health urgent care after being brought in by police. She is disoriented and unable to state her current location or where she was picked up from. She reports that the police and helicopters were trying to help her locate her home, which she believes is [facility]. She refers to this facility, and others like it, as "nursing homes," suggesting a distorted sense of reality and place.  She was just discharged from [facility] yesterday following an 11-day admission for similar symptoms, including auditory and visual hallucinations, disorganized behavior (e.g., walking around naked), and delusional thinking. She admits to not taking her prescribed medications, and endorses continued use of marijuana and cigarettes, though she denies alcohol use.  The patient currently endorses auditory hallucinations and visual hallucinations, stating she sees ancestors and deceased relatives. She reports suicidal ideation linked to being unable to find her "home," though her concept of "home" is non-specific and rooted in her delusions. She denies homicidal ideation and denies access to weapons. She is unaware of the current date, incorrectly identifying the year as 2023, and believes she has a future court date on March 7th despite already missing that date, indicating poor insight and cognitive  disorientation.  Collateral from her significant other indicates that the patient was also recently discharged from [facility], marking her third psychiatric hospitalization in two months. He reports she remains paranoid, speaks nonsensically, and that he is unable to care for their shared two-year-old daughter due to the patient's erratic behaviors. She is currently prescribed Abilify  Maintena 400 mg monthly, Abilify  20 mg daily, and Seroquel  200 mg, but continues to experience active psychosis, suggesting either nonadherence or medication ineffectiveness. She is not currently under guardianship.    Total Time spent with patient: 45 minutes  Musculoskeletal  Strength & Muscle Tone: within normal limits Gait & Station: normal Patient leans: N/A  Psychiatric Specialty Exam  Presentation General Appearance:  Bizarre; Disheveled  Eye Contact: Fleeting  Speech: Blocked  Speech Volume: Decreased  Handedness: Right   Mood and Affect  Mood: Anxious; Depressed; Hopeless  Affect: Depressed; Inappropriate; Blunt   Thought Process  Thought Processes: Disorganized; Irrevelant  Descriptions of Associations:Loose  Orientation:Partial  Thought Content:Tangential  Diagnosis of Schizophrenia or Schizoaffective disorder in past: Yes  Duration of Psychotic Symptoms: Greater than six months  Hallucinations:Hallucinations: Auditory; Visual Description of Auditory Hallucinations: hears voices telling her to kill her self Description of Visual Hallucinations: Sees "ancestors and family memebers"  Ideas of Reference:Delusions  Suicidal Thoughts:Suicidal Thoughts: Yes, Active SI Active Intent and/or Plan: With Intent; Without Plan  Homicidal Thoughts:Homicidal Thoughts: No   Sensorium  Memory: Immediate Poor  Judgment: Impaired  Insight: None   Executive Functions  Concentration: Poor  Attention Span: Poor  Recall: Poor  Fund of  Knowledge: Poor  Language: Poor   Psychomotor Activity  Psychomotor Activity: Psychomotor Activity: Normal   Assets  Assets: Financial Resources/Insurance   Sleep  Sleep: Sleep: Poor  Nutritional Assessment (For OBS and FBC admissions only) Has the patient had a decrease in food intake/or appetite?: No Does the patient have dental problems?: No Does the patient have eating habits or behaviors that may be indicators of an eating disorder including binging or inducing vomiting?: No Has the patient recently lost weight without trying?: 0 Has the patient been eating poorly because of a decreased appetite?: 0 Malnutrition Screening Tool Score: 0    Physical Exam Vitals and nursing note reviewed.  Constitutional:      Appearance: Normal appearance.  HENT:     Head: Normocephalic and atraumatic.     Nose: Nose normal.     Mouth/Throat:     Mouth: Mucous membranes are dry.  Eyes:     Pupils: Pupils are equal, round, and reactive to light.  Pulmonary:     Effort: Pulmonary effort is normal.  Musculoskeletal:        General: Normal range of motion.     Cervical back: Normal range of motion.  Skin:    General: Skin is dry.  Neurological:     Mental Status: She is alert and oriented to person, place, and time.  Psychiatric:        Attention and Perception: Attention normal. She perceives auditory hallucinations.        Mood and Affect: Mood is depressed. Affect is blunt, flat and inappropriate.        Speech: Speech is delayed.        Behavior: Behavior is slowed. Behavior is cooperative.   Review of Systems  Psychiatric/Behavioral:  Positive for depression, hallucinations, substance abuse and suicidal ideas. The patient is nervous/anxious.   All other systems reviewed and are negative.   Blood pressure 123/83, pulse (!) 102, temperature 99.4 F (37.4 C), temperature source Oral, resp. rate 17, last menstrual period 06/01/2023, SpO2 99%, unknown if currently  breastfeeding. There is no height or weight on file to calculate BMI.  Past Psychiatric History: Schizophrenia   Is the patient at risk to self? Yes  Has the patient been a risk to self in the past 6 months? Yes .    Has the patient been a risk to self within the distant past? Yes   Is the patient a risk to others? No   Has the patient been a risk to others in the past 6 months? No   Has the patient been a risk to others within the distant past? No   Past Medical History:  Past Medical History:  Diagnosis Date   Anxiety    Cluster B personality disorder (HCC)    Depression    Medical history non-contributory      Family History:  Family History  Problem Relation Age of Onset   Diabetes Mother      Social History:  Social History   Socioeconomic History   Marital status: Single    Spouse name: Not on file   Number of children: Not on file   Years of education: Not on file   Highest education level: Not on file  Occupational History   Not on file  Tobacco Use   Smoking status: Never   Smokeless tobacco: Never   Tobacco comments:    "I smoke weed everyday"  Vaping Use   Vaping status: Never Used  Substance and Sexual Activity   Alcohol use: Not Currently    Alcohol/week: 2.0 standard drinks of alcohol    Types: 2 Glasses of wine per week  Drug use: Never   Sexual activity: Yes    Partners: Male  Other Topics Concern   Not on file  Social History Narrative   Not on file   Social Drivers of Health   Financial Resource Strain: High Risk (05/23/2023)   Overall Financial Resource Strain (CARDIA)    Difficulty of Paying Living Expenses: Very hard  Food Insecurity: Food Insecurity Present (07/01/2023)   Hunger Vital Sign    Worried About Running Out of Food in the Last Year: Often true    Ran Out of Food in the Last Year: Often true  Transportation Needs: No Transportation Needs (07/01/2023)   PRAPARE - Administrator, Civil Service (Medical): No     Lack of Transportation (Non-Medical): No  Physical Activity: Inactive (05/23/2023)   Exercise Vital Sign    Days of Exercise per Week: 0 days    Minutes of Exercise per Session: 0 min  Stress: No Stress Concern Present (05/23/2023)   [employer] of Occupational Health - Occupational Stress Questionnaire    Feeling of Stress : Not at all  Social Connections: Moderately Isolated (05/23/2023)   Social Connection and Isolation Panel [NHANES]    Frequency of Communication with Friends and Family: Never    Frequency of Social Gatherings with Friends and Family: Never    Attends Religious Services: More than 4 times per year    Active Member of [employer] or Organizations: Yes    Attends Banker Meetings: More than 4 times per year    Marital Status: Never married  Intimate Partner Violence: Not At Risk (07/01/2023)   Humiliation, Afraid, Rape, and Kick questionnaire    Fear of Current or Ex-Partner: No    Emotionally Abused: No    Physically Abused: No    Sexually Abused: No     Last Labs:  Admission on 07/12/2023  Component Date Value Ref Range Status   WBC 07/12/2023 8.6  4.0 - 10.5 K/uL Final   RBC 07/12/2023 4.86  3.87 - 5.11 MIL/uL Final   Hemoglobin 07/12/2023 12.6  12.0 - 15.0 g/dL Final   HCT 95/28/4132 40.5  36.0 - 46.0 % Final   MCV 07/12/2023 83.3  80.0 - 100.0 fL Final   MCH 07/12/2023 25.9 (L)  26.0 - 34.0 pg Final   MCHC 07/12/2023 31.1  30.0 - 36.0 g/dL Final   RDW 44/03/270 14.8  11.5 - 15.5 % Final   Platelets 07/12/2023 335  150 - 400 K/uL Final   nRBC 07/12/2023 0.0  0.0 - 0.2 % Final   Neutrophils Relative % 07/12/2023 67  % Final   Neutro Abs 07/12/2023 5.8  1.7 - 7.7 K/uL Final   Lymphocytes Relative 07/12/2023 23  % Final   Lymphs Abs 07/12/2023 2.0  0.7 - 4.0 K/uL Final   Monocytes Relative 07/12/2023 8  % Final   Monocytes Absolute 07/12/2023 0.6  0.1 - 1.0 K/uL Final   Eosinophils Relative 07/12/2023 1  % Final   Eosinophils Absolute 07/12/2023  0.0  0.0 - 0.5 K/uL Final   Basophils Relative 07/12/2023 1  % Final   Basophils Absolute 07/12/2023 0.1  0.0 - 0.1 K/uL Final   Immature Granulocytes 07/12/2023 0  % Final   Abs Immature Granulocytes 07/12/2023 0.01  0.00 - 0.07 K/uL Final   Performed at [facility], [address], [place], [place] [address]   Sodium 07/12/2023 140  135 - 145 mmol/L Final   Potassium 07/12/2023 4.0  3.5 - 5.1 mmol/L Final   Chloride 07/12/2023 106  98 - 111 mmol/L Final   CO2 07/12/2023 23  22 - 32 mmol/L Final   Glucose, Bld 07/12/2023 104 (H)  70 - 99 mg/dL Final   Glucose reference range applies only to samples taken after fasting for at least 8 hours.   BUN 07/12/2023 6  6 - 20 mg/dL Final   Creatinine, Ser 07/12/2023 0.79  0.44 - 1.00 mg/dL Final   Calcium 91/47/8295 9.9  8.9 - 10.3 mg/dL Final   Total Protein 62/13/0865 7.4  6.5 - 8.1 g/dL Final   Albumin 78/46/9629 3.9  3.5 - 5.0 g/dL Final   AST 52/84/1324 20  15 - 41 U/L Final   ALT 07/12/2023 23  0 - 44 U/L Final   Alkaline Phosphatase 07/12/2023 94  38 - 126 U/L Final   Total Bilirubin 07/12/2023 0.4  0.0 - 1.2 mg/dL Final   GFR, Estimated 07/12/2023 >60  >60 mL/min Final   Comment: (NOTE) Calculated using the CKD-EPI Creatinine Equation (2021)    Anion gap 07/12/2023 11  5 - 15 Final   Performed at [facility], [address] [place], [place] [address]   Magnesium  07/12/2023 2.0  1.7 - 2.4 mg/dL Final   Performed at [facility], [address], [place], [place] [address]   Alcohol, Ethyl (B) 07/12/2023 <15  <15 mg/dL Final   Comment: Please note change in reference range. (NOTE) For medical purposes only. Performed at [facility], [address], [place], [place] [address]    TSH 07/12/2023 2.632  0.350 - 4.500 uIU/mL Final   Comment: Performed by a 3rd Generation assay with a functional sensitivity of <=0.01 uIU/mL. Performed at [facility], [address], [place], [place] [address]    POC  Amphetamine UR 07/12/2023 None Detected  NONE DETECTED (Cut Off Level 1000 ng/mL) Final   POC Secobarbital (BAR) 07/12/2023 None Detected  NONE DETECTED (Cut Off Level 300 ng/mL) Final   POC Buprenorphine (BUP) 07/12/2023 None Detected  NONE DETECTED (Cut Off Level 10 ng/mL) Final   POC Oxazepam (BZO) 07/12/2023 Positive (A)  NONE DETECTED (Cut Off Level 300 ng/mL) Final   POC Cocaine UR 07/12/2023 None Detected  NONE DETECTED (Cut Off Level 300 ng/mL) Final   POC Methamphetamine UR 07/12/2023 None Detected  NONE DETECTED (Cut Off Level 1000 ng/mL) Final   POC Morphine  07/12/2023 None Detected  NONE DETECTED (Cut Off Level 300 ng/mL) Final   POC Methadone UR 07/12/2023 None Detected  NONE DETECTED (Cut Off Level 300 ng/mL) Final   POC Oxycodone  UR 07/12/2023 None Detected  NONE DETECTED (Cut Off Level 100 ng/mL) Final   POC Marijuana UR 07/12/2023 Positive (A)  NONE DETECTED (Cut Off Level 50 ng/mL) Final   Glucose-Capillary 07/12/2023 135 (H)  70 - 99 mg/dL Final   Glucose reference range applies only to samples taken after fasting for at least 8 hours.  Admission on 07/01/2023, Discharged on 07/12/2023  Component Date Value Ref Range Status   WBC 07/03/2023 5.8  4.0 - 10.5 K/uL Final   RBC 07/03/2023 4.93  3.87 - 5.11 MIL/uL Final   Hemoglobin 07/03/2023 12.8  12.0 - 15.0 g/dL Final   HCT 95/63/8756 41.7  36.0 - 46.0 % Final   MCV 07/03/2023 84.6  80.0 - 100.0 fL Final   MCH 07/03/2023 26.0  26.0 - 34.0 pg Final   MCHC 07/03/2023 30.7  30.0 -  36.0 g/dL Final   RDW 86/57/8469 15.4  11.5 - 15.5 % Final   Platelets 07/03/2023 363  150 - 400 K/uL Final   nRBC 07/03/2023 0.0  0.0 - 0.2 % Final   Neutrophils Relative % 07/03/2023 54  % Final   Neutro Abs 07/03/2023 3.1  1.7 - 7.7 K/uL Final   Lymphocytes Relative 07/03/2023 34  % Final   Lymphs Abs 07/03/2023 2.0  0.7 - 4.0 K/uL Final   Monocytes Relative 07/03/2023 9  % Final   Monocytes Absolute 07/03/2023 0.5  0.1 - 1.0 K/uL Final    Eosinophils Relative 07/03/2023 2  % Final   Eosinophils Absolute 07/03/2023 0.1  0.0 - 0.5 K/uL Final   Basophils Relative 07/03/2023 1  % Final   Basophils Absolute 07/03/2023 0.1  0.0 - 0.1 K/uL Final   Immature Granulocytes 07/03/2023 0  % Final   Abs Immature Granulocytes 07/03/2023 0.01  0.00 - 0.07 K/uL Final   Performed at [facility], [address], [place], [place] [address]   Sodium 07/03/2023 137  135 - 145 mmol/L Final   Potassium 07/03/2023 3.6  3.5 - 5.1 mmol/L Final   Chloride 07/03/2023 105  98 - 111 mmol/L Final   CO2 07/03/2023 21 (L)  22 - 32 mmol/L Final   Glucose, Bld 07/03/2023 97  70 - 99 mg/dL Final   Glucose reference range applies only to samples taken after fasting for at least 8 hours.   BUN 07/03/2023 6  6 - 20 mg/dL Final   Creatinine, Ser 07/03/2023 0.75  0.44 - 1.00 mg/dL Final   Calcium 84/13/2440 9.5  8.9 - 10.3 mg/dL Final   Total Protein 01/14/2535 7.9  6.5 - 8.1 g/dL Final   Albumin 64/40/3474 4.0  3.5 - 5.0 g/dL Final   AST 25/95/6387 50 (H)  15 - 41 U/L Final   ALT 07/03/2023 86 (H)  0 - 44 U/L Final   Alkaline Phosphatase 07/03/2023 114  38 - 126 U/L Final   Total Bilirubin 07/03/2023 0.8  0.0 - 1.2 mg/dL Final   GFR, Estimated 07/03/2023 >60  >60 mL/min Final   Comment: (NOTE) Calculated using the CKD-EPI Creatinine Equation (2021)    Anion gap 07/03/2023 11  5 - 15 Final   Performed at [facility], [address] [place], [place] [address]   Vitamin B-12 07/03/2023 409  180 - 914 pg/mL Final   Comment: (NOTE) This assay is not validated for testing neonatal or myeloproliferative syndrome specimens for Vitamin B12 levels. Performed at [facility], [address] [place], [place] [address]    Folate 07/03/2023 14.1  >5.9 ng/mL Final   Performed at [facility], [address], [place], [place] [address]  Admission on 06/30/2023, Discharged on 07/01/2023   Component Date Value Ref Range Status   Sodium 06/30/2023 136  135 - 145 mmol/L Final   Potassium 06/30/2023 3.8  3.5 - 5.1 mmol/L Final   Chloride 06/30/2023 106  98 - 111 mmol/L Final   CO2 06/30/2023 24  22 - 32 mmol/L Final   Glucose, Bld 06/30/2023 109 (H)  70 - 99 mg/dL Final   Glucose reference range applies only to samples taken after fasting for at least 8 hours.   BUN 06/30/2023 8  6 - 20 mg/dL Final   Creatinine, Ser 06/30/2023 0.67  0.44 - 1.00 mg/dL Final   Calcium 09/17/1599 9.0  8.9 - 10.3 mg/dL Final  Total Protein 06/30/2023 7.8  6.5 - 8.1 g/dL Final   Albumin 16/12/9602 3.8  3.5 - 5.0 g/dL Final   AST 54/11/8117 95 (H)  15 - 41 U/L Final   ALT 06/30/2023 96 (H)  0 - 44 U/L Final   Alkaline Phosphatase 06/30/2023 115  38 - 126 U/L Final   Total Bilirubin 06/30/2023 0.3  0.0 - 1.2 mg/dL Final   GFR, Estimated 06/30/2023 >60  >60 mL/min Final   Comment: (NOTE) Calculated using the CKD-EPI Creatinine Equation (2021)    Anion gap 06/30/2023 6  5 - 15 Final   Performed at [facility], [address], [place], [place] [address]   Alcohol, Ethyl (B) 06/30/2023 <10  <10 mg/dL Final   Comment: (NOTE) Lowest detectable limit for serum alcohol is 10 mg/dL.  For medical purposes only. Performed at [facility], [address], [place], [place] [address]    Opiates 06/30/2023 NONE DETECTED  NONE DETECTED Final   Cocaine 06/30/2023 NONE DETECTED  NONE DETECTED Final   Benzodiazepines 06/30/2023 NONE DETECTED  NONE DETECTED Final   Amphetamines 06/30/2023 NONE DETECTED  NONE DETECTED Final   Tetrahydrocannabinol 06/30/2023 POSITIVE (A)  NONE DETECTED Final   Barbiturates 06/30/2023 NONE DETECTED  NONE DETECTED Final   Comment: (NOTE) DRUG SCREEN FOR MEDICAL PURPOSES ONLY.  IF CONFIRMATION IS NEEDED FOR ANY PURPOSE, NOTIFY LAB WITHIN 5 DAYS.  LOWEST DETECTABLE LIMITS FOR URINE DRUG SCREEN Drug Class                     Cutoff  (ng/mL) Amphetamine and metabolites    1000 Barbiturate and metabolites    200 Benzodiazepine                 200 Opiates and metabolites        300 Cocaine and metabolites        300 THC                            50 Performed at [facility], [address] [place], [place] [address]    WBC 06/30/2023 6.6  4.0 - 10.5 K/uL Final   RBC 06/30/2023 4.54  3.87 - 5.11 MIL/uL Final   Hemoglobin 06/30/2023 11.9 (L)  12.0 - 15.0 g/dL Final   HCT 78/46/9629 38.5  36.0 - 46.0 % Final   MCV 06/30/2023 84.8  80.0 - 100.0 fL Final   MCH 06/30/2023 26.2  26.0 - 34.0 pg Final   MCHC 06/30/2023 30.9  30.0 - 36.0 g/dL Final   RDW 52/84/1324 15.4  11.5 - 15.5 % Final   Platelets 06/30/2023 338  150 - 400 K/uL Final   nRBC 06/30/2023 0.0  0.0 - 0.2 % Final   Neutrophils Relative % 06/30/2023 60  % Final   Neutro Abs 06/30/2023 4.0  1.7 - 7.7 K/uL Final   Lymphocytes Relative 06/30/2023 29  % Final   Lymphs Abs 06/30/2023 1.9  0.7 - 4.0 K/uL Final   Monocytes Relative 06/30/2023 8  % Final   Monocytes Absolute 06/30/2023 0.5  0.1 - 1.0 K/uL Final   Eosinophils Relative 06/30/2023 2  % Final   Eosinophils Absolute 06/30/2023 0.1  0.0 - 0.5 K/uL Final   Basophils Relative 06/30/2023 1  % Final   Basophils Absolute 06/30/2023 0.1  0.0 - 0.1 K/uL Final   Immature Granulocytes 06/30/2023 0  % Final  Abs Immature Granulocytes 06/30/2023 0.02  0.00 - 0.07 K/uL Final   Performed at [facility], [address] [place], [place] [address]   Preg, Serum 06/30/2023 NEGATIVE  NEGATIVE Final   Comment:        THE SENSITIVITY OF THIS METHODOLOGY IS >10 mIU/mL. Performed at [facility], [address] [place], [place] [address]   Admission on 06/09/2023, Discharged on 06/10/2023  Component Date Value Ref Range Status   WBC 06/09/2023 7.3  4.0 - 10.5 K/uL Final   RBC 06/09/2023 4.97  3.87 - 5.11 MIL/uL Final   Hemoglobin 06/09/2023 12.9  12.0 -  15.0 g/dL Final   HCT 09/81/1914 41.5  36.0 - 46.0 % Final   MCV 06/09/2023 83.5  80.0 - 100.0 fL Final   MCH 06/09/2023 26.0  26.0 - 34.0 pg Final   MCHC 06/09/2023 31.1  30.0 - 36.0 g/dL Final   RDW 78/29/5621 15.4  11.5 - 15.5 % Final   Platelets 06/09/2023 337  150 - 400 K/uL Final   nRBC 06/09/2023 0.0  0.0 - 0.2 % Final   Neutrophils Relative % 06/09/2023 65  % Final   Neutro Abs 06/09/2023 4.8  1.7 - 7.7 K/uL Final   Lymphocytes Relative 06/09/2023 25  % Final   Lymphs Abs 06/09/2023 1.8  0.7 - 4.0 K/uL Final   Monocytes Relative 06/09/2023 8  % Final   Monocytes Absolute 06/09/2023 0.6  0.1 - 1.0 K/uL Final   Eosinophils Relative 06/09/2023 1  % Final   Eosinophils Absolute 06/09/2023 0.0  0.0 - 0.5 K/uL Final   Basophils Relative 06/09/2023 1  % Final   Basophils Absolute 06/09/2023 0.1  0.0 - 0.1 K/uL Final   Immature Granulocytes 06/09/2023 0  % Final   Abs Immature Granulocytes 06/09/2023 0.01  0.00 - 0.07 K/uL Final   Performed at [facility], [address] [place], [place] [address]   Sodium 06/09/2023 136  135 - 145 mmol/L Final   Potassium 06/09/2023 4.1  3.5 - 5.1 mmol/L Final   Chloride 06/09/2023 105  98 - 111 mmol/L Final   CO2 06/09/2023 22  22 - 32 mmol/L Final   Glucose, Bld 06/09/2023 89  70 - 99 mg/dL Final   Glucose reference range applies only to samples taken after fasting for at least 8 hours.   BUN 06/09/2023 6  6 - 20 mg/dL Final   Creatinine, Ser 06/09/2023 0.66  0.44 - 1.00 mg/dL Final   Calcium 78/46/9629 9.5  8.9 - 10.3 mg/dL Final   Total Protein 52/84/1324 7.4  6.5 - 8.1 g/dL Final   Albumin 40/12/2723 4.1  3.5 - 5.0 g/dL Final   AST 36/64/4034 22  15 - 41 U/L Final   ALT 06/09/2023 17  0 - 44 U/L Final   Alkaline Phosphatase 06/09/2023 97  38 - 126 U/L Final   Total Bilirubin 06/09/2023 0.9  0.0 - 1.2 mg/dL Final   GFR, Estimated 06/09/2023 >60  >60 mL/min Final   Comment: (NOTE) Calculated using the CKD-EPI Creatinine Equation  (2021)    Anion gap 06/09/2023 9  5 - 15 Final   Performed at [facility], [address], [place], [place] [address]   Hgb A1c MFr Bld 06/09/2023 4.9  4.8 - 5.6 % Final   Comment: (NOTE) Pre diabetes:          5.7%-6.4%  Diabetes:              >  6.4%  Glycemic control for   <7.0% adults with diabetes    Mean Plasma Glucose 06/09/2023 93.93  mg/dL Final   Performed at [facility], [address], [place], [place] [address]   Alcohol, Ethyl (B) 06/09/2023 <10  <10 mg/dL Final   Comment: (NOTE) Lowest detectable limit for serum alcohol is 10 mg/dL.  For medical purposes only. Performed at [facility], [address], [place], [place] [address]    Cholesterol 06/09/2023 154  0 - 200 mg/dL Final   Triglycerides 29/56/2130 51  <150 mg/dL Final   HDL 86/57/8469 43  >40 mg/dL Final   Total CHOL/HDL Ratio 06/09/2023 3.6  RATIO Final   VLDL 06/09/2023 10  0 - 40 mg/dL Final   LDL Cholesterol 06/09/2023 101 (H)  0 - 99 mg/dL Final   Comment:        Total Cholesterol/HDL:CHD Risk Coronary Heart Disease Risk Table                     Men   Women  1/2 Average Risk   3.4   3.3  Average Risk       5.0   4.4  2 X Average Risk   9.6   7.1  3 X Average Risk  23.4   11.0        Use the calculated Patient Ratio above and the CHD Risk Table to determine the patient's CHD Risk.        ATP III CLASSIFICATION (LDL):  <100     mg/dL   Optimal  629-528  mg/dL   Near or Above                    Optimal  130-159  mg/dL   Borderline  413-244  mg/dL   High  >010     mg/dL   Very High Performed at [facility], [address] [place], [place] [address]    RPR Ser Ql 06/09/2023 NON REACTIVE  NON REACTIVE Final   Performed at [facility], [address] [place], [place] [address]   Color, Urine 06/09/2023 YELLOW  YELLOW Final   APPearance 06/09/2023 CLEAR  CLEAR Final   Specific Gravity, Urine 06/09/2023 <1.005 (L)  1.005 - 1.030 Final   pH 06/09/2023 6.5   5.0 - 8.0 Final   Glucose, UA 06/09/2023 NEGATIVE  NEGATIVE mg/dL Final   Hgb urine dipstick 06/09/2023 LARGE (A)  NEGATIVE Final   Bilirubin Urine 06/09/2023 NEGATIVE  NEGATIVE Final   Ketones, ur 06/09/2023 NEGATIVE  NEGATIVE mg/dL Final   Protein, ur 34/74/2595 NEGATIVE  NEGATIVE mg/dL Final   Nitrite 63/87/5643 NEGATIVE  NEGATIVE Final   Leukocytes,Ua 06/09/2023 NEGATIVE  NEGATIVE Final   Performed at [facility], [address], [place], [place] [address]   Preg Test, Ur 06/09/2023 Negative  Negative Final   POC Amphetamine UR 06/09/2023 None Detected  NONE DETECTED (Cut Off Level 1000 ng/mL) Final   POC Secobarbital (BAR) 06/09/2023 None Detected  NONE DETECTED (Cut Off Level 300 ng/mL) Final   POC Buprenorphine (BUP) 06/09/2023 None Detected  NONE DETECTED (Cut Off Level 10 ng/mL) Final   POC Oxazepam (BZO) 06/09/2023 None Detected  NONE DETECTED (Cut Off Level 300 ng/mL) Final   POC Cocaine UR 06/09/2023 None Detected  NONE DETECTED (Cut Off Level 300 ng/mL) Final   POC Methamphetamine UR 06/09/2023 None Detected  NONE DETECTED (Cut Off Level  1000 ng/mL) Final   POC Morphine  06/09/2023 None Detected  NONE DETECTED (Cut Off Level 300 ng/mL) Final   POC Methadone UR 06/09/2023 None Detected  NONE DETECTED (Cut Off Level 300 ng/mL) Final   POC Oxycodone  UR 06/09/2023 None Detected  NONE DETECTED (Cut Off Level 100 ng/mL) Final   POC Marijuana UR 06/09/2023 Positive (A)  NONE DETECTED (Cut Off Level 50 ng/mL) Final   HIV Screen 4th Generation wRfx 06/09/2023 Non Reactive  Non Reactive Final   Performed at [facility], [address], [place], [place] [address]   TSH 06/09/2023 2.309  0.350 - 4.500 uIU/mL Final   Comment: Performed by a 3rd Generation assay with a functional sensitivity of <=0.01 uIU/mL. Performed at [facility], [address], [place], [place] [address]    RBC / HPF 06/09/2023 0-5  0 - 5 RBC/hpf Final   WBC, UA 06/09/2023 NONE SEEN  0 - 5 WBC/hpf  Final   Bacteria, UA 06/09/2023 RARE (A)  NONE SEEN Final   Squamous Epithelial / HPF 06/09/2023 0-5  0 - 5 /HPF Final   Amorphous Crystal 06/09/2023 PRESENT   Final   Performed at [facility], [address] [place], [place] [address]  Admission on 03/29/2023, Discharged on 03/29/2023  Component Date Value Ref Range Status   WBC 03/29/2023 5.4  4.0 - 10.5 K/uL Final   RBC 03/29/2023 4.79  3.87 - 5.11 MIL/uL Final   Hemoglobin 03/29/2023 12.5  12.0 - 15.0 g/dL Final   HCT 17/61/6073 39.9  36.0 - 46.0 % Final   MCV 03/29/2023 83.3  80.0 - 100.0 fL Final   MCH 03/29/2023 26.1  26.0 - 34.0 pg Final   MCHC 03/29/2023 31.3  30.0 - 36.0 g/dL Final   RDW 71/08/2692 14.3  11.5 - 15.5 % Final   Platelets 03/29/2023 266  150 - 400 K/uL Final   nRBC 03/29/2023 0.0  0.0 - 0.2 % Final   Neutrophils Relative % 03/29/2023 50  % Final   Neutro Abs 03/29/2023 2.7  1.7 - 7.7 K/uL Final   Lymphocytes Relative 03/29/2023 39  % Final   Lymphs Abs 03/29/2023 2.1  0.7 - 4.0 K/uL Final   Monocytes Relative 03/29/2023 10  % Final   Monocytes Absolute 03/29/2023 0.5  0.1 - 1.0 K/uL Final   Eosinophils Relative 03/29/2023 1  % Final   Eosinophils Absolute 03/29/2023 0.0  0.0 - 0.5 K/uL Final   Basophils Relative 03/29/2023 0  % Final   Basophils Absolute 03/29/2023 0.0  0.0 - 0.1 K/uL Final   Immature Granulocytes 03/29/2023 0  % Final   Abs Immature Granulocytes 03/29/2023 0.01  0.00 - 0.07 K/uL Final   Performed at [facility], [address], [place], [place] [address]   Sodium 03/29/2023 136  135 - 145 mmol/L Final   Potassium 03/29/2023 3.6  3.5 - 5.1 mmol/L Final   Chloride 03/29/2023 105  98 - 111 mmol/L Final   CO2 03/29/2023 22  22 - 32 mmol/L Final   Glucose, Bld 03/29/2023 101 (H)  70 - 99 mg/dL Final   Glucose reference range applies only to samples taken after fasting for at least 8 hours.   BUN 03/29/2023 5 (L)  6 - 20 mg/dL Final   Creatinine, Ser 03/29/2023 0.76  0.44 - 1.00  mg/dL Final   Calcium 70/35/0093 9.1  8.9 - 10.3 mg/dL Final   Total Protein 81/82/9937 7.0  6.5 - 8.1 g/dL Final   Albumin 81/19/1478 3.6  3.5 - 5.0 g/dL Final   AST 29/56/2130 32  15 - 41 U/L Final   ALT 03/29/2023 34  0 - 44 U/L Final   Alkaline Phosphatase 03/29/2023 114  38 - 126 U/L Final   Total Bilirubin 03/29/2023 0.4  0.0 - 1.2 mg/dL Final   GFR, Estimated 03/29/2023 >60  >60 mL/min Final   Comment: (NOTE) Calculated using the CKD-EPI Creatinine Equation (2021)    Anion gap 03/29/2023 9  5 - 15 Final   Performed at [facility], [address] [place], [place] [address]   Lipase 03/29/2023 43  11 - 51 U/L Final   Performed at [facility], [address], [place], [place] [address]   Color, Urine 03/29/2023 YELLOW  YELLOW Final   APPearance 03/29/2023 CLEAR  CLEAR Final   Specific Gravity, Urine 03/29/2023 1.014  1.005 - 1.030 Final   pH 03/29/2023 7.0  5.0 - 8.0 Final   Glucose, UA 03/29/2023 NEGATIVE  NEGATIVE mg/dL Final   Hgb urine dipstick 03/29/2023 NEGATIVE  NEGATIVE Final   Bilirubin Urine 03/29/2023 NEGATIVE  NEGATIVE Final   Ketones, ur 03/29/2023 NEGATIVE  NEGATIVE mg/dL Final   Protein, ur 95/28/4132 NEGATIVE  NEGATIVE mg/dL Final   Nitrite 44/03/270 NEGATIVE  NEGATIVE Final   Leukocytes,Ua 03/29/2023 NEGATIVE  NEGATIVE Final   Performed at [facility], [address] [place], [place] [address]   Preg Test, Ur 03/29/2023 NEGATIVE  NEGATIVE Final   Comment:        THE SENSITIVITY OF THIS METHODOLOGY IS >25 mIU/mL. Performed at [facility], [address], [place], [place] [address]   Office Visit on 03/07/2023  Component Date Value Ref Range Status   Hemoglobin A1C 03/07/2023 5.1  4.0 - 5.6 % Final   Glucose-Capillary 03/07/2023 108 (H)  70 - 99 mg/dL Final   Glucose reference range applies only to samples taken after fasting for at least 8 hours.  Office Visit on 01/22/2023  Component Date Value Ref Range Status   Preg Test,  Ur 01/22/2023 Negative  Negative Final    Allergies: Patient has no known allergies.  Medications:  Facility Ordered Medications  Medication   acetaminophen  (TYLENOL ) tablet 650 mg   alum & mag hydroxide-simeth (MAALOX/MYLANTA) 200-200-20 MG/5ML suspension 30 mL   magnesium  hydroxide (MILK OF MAGNESIA) suspension 30 mL   haloperidol  (HALDOL ) tablet 5 mg   And   diphenhydrAMINE  (BENADRYL ) capsule 50 mg   haloperidol  lactate (HALDOL ) injection 5 mg   And   diphenhydrAMINE  (BENADRYL ) injection 50 mg   And   LORazepam  (ATIVAN ) tablet 2 mg   haloperidol  lactate (HALDOL ) injection 10 mg   And   diphenhydrAMINE  (BENADRYL ) injection 50 mg   And   LORazepam  (ATIVAN ) tablet 2 mg   hydrOXYzine  (ATARAX ) tablet 25 mg   traZODone  (DESYREL ) tablet 50 mg   PTA Medications  Medication Sig   hydrOXYzine  (ATARAX ) 25 MG tablet Take 1 tablet (25 mg total) by mouth 3 (three) times daily as needed for anxiety.   [START ON 08/07/2023] paliperidone  (INVEGA  SUSTENNA) 156 MG/ML SUSY injection Inject 1 mL (156 mg total) into the muscle every 28 (twenty-eight) days. Patient last received on 07/10/23 and is due for next LAI maintenance dose of 156 mg on 08/07/23   polyethylene glycol (MIRALAX  / GLYCOLAX ) 17 g packet Take 17 g by mouth daily.   traZODone  (DESYREL ) 150  MG tablet Take 1 tablet (150 mg total) by mouth at bedtime as needed for sleep.   FLUoxetine  (PROZAC ) 20 MG capsule Take 1 capsule (20 mg total) by mouth daily.      Medical Decision Making  Patient is recommended for inpatient hospitalization for medication management and psychiatric stability.     Recommendations  Based on my evaluation the patient does not appear to have an emergency medical condition.  Psychiatric: Recommend inpatient psychiatric readmission for medication stabilization and safety, given poor insight, ongoing psychosis, and suicidal ideation linked to delusional thinking.  Medication: Reassess current antipsychotic  regimen for effectiveness; consider medication plasma levels if available, and evaluate for potential need to titrate or change therapy. Consider long-acting injectable compliance monitoring.  Case Management: Refer for ACT (Assertive Community Treatment) team involvement due to frequent hospitalizations, poor medication adherence, ongoing psychotic symptoms, and impaired functioning.  Legal and Social: Reassess need for adult guardianship due to impaired reality testing and functional deficits. Social work consult advised for legal follow-up and child welfare considerations.  Substance Use: Educate and counsel on the negative impact of cannabis on psychosis; offer substance use treatment resources upon stabilization.   Al Alias, NP 07/13/23  12:05 AM   Abbey Hobby, MD 07/29/23 [phone]

## 2023-07-13 NOTE — ED Notes (Signed)
 Patient seen taking her shirt off. Patient complied with this nurse's request to put her shirt back on, but then took off all of her clothes. Pt was completely naked but did comply when asked to put them back on. Pt currently laying down.

## 2023-07-13 NOTE — ED Notes (Addendum)
 Patient alert and orient. Denies SI/HI/AVH. Pt crawled to assessment room, when asked if able to walk pt stated "No, I hurt my ankle", but when entering the room pt stood up and took socks off for skin assessment standing in one foot each time during assessement of feet. Respirations equal and unlabored, skin warm and dry. PRN and scheduled meds administered per MAR. Routine safety checks conducted according to facility protocol. Will continue to monitor for safety.

## 2023-07-13 NOTE — Progress Notes (Signed)
 Patient currently being reviewed by [facility].  Carly Brooks, [facility]

## 2023-07-13 NOTE — ED Notes (Signed)
 Patient observed/assessed in bed/chair resting quietly appearing in no distress and verbalizing no complaints at this time. Will continue to monitor.

## 2023-07-15 LAB — PROLACTIN: Prolactin: 14.2 ng/mL (ref 4.8–33.4)

## 2023-07-24 ENCOUNTER — Telehealth (HOSPITAL_COMMUNITY): Payer: Self-pay | Admitting: Psychiatry

## 2023-07-24 NOTE — Telephone Encounter (Signed)
 D:  Pt returned case manager's call.  States she is interested in group.  [facility].  A:  Provided pt with support.  Reiterated to patient that her discharge plan mentioned an ACTT Team.  "They had me set up in a homeless shelter in [place], but that was too far from home, so I came back to [place].  I need help with housing."  Provided pt with [facility] phone # [phone] and [facility] [phone].  Informed pt they should be able to provide her with case management among other services.  Also, strongly encouraged pt to go to [facility] or call [phone] if self-harm thoughts/HI or A/V hallucinations should arise.  R:  Pt receptive.

## 2023-07-28 ENCOUNTER — Other Ambulatory Visit: Payer: Self-pay

## 2023-07-28 ENCOUNTER — Ambulatory Visit (HOSPITAL_COMMUNITY)
Admission: EM | Admit: 2023-07-28 | Discharge: 2023-07-30 | Disposition: A | Discharge status: Other Acute Inpatient Hospital | Attending: Urology | Admitting: Urology

## 2023-07-28 DIAGNOSIS — R45851 Suicidal ideations: Secondary | ICD-10-CM | POA: Diagnosis not present

## 2023-07-28 DIAGNOSIS — F331 Major depressive disorder, recurrent, moderate: Secondary | ICD-10-CM | POA: Insufficient documentation

## 2023-07-28 DIAGNOSIS — Z3202 Encounter for pregnancy test, result negative: Secondary | ICD-10-CM

## 2023-07-28 DIAGNOSIS — F319 Bipolar disorder, unspecified: Secondary | ICD-10-CM | POA: Diagnosis present

## 2023-07-28 LAB — COMPREHENSIVE METABOLIC PANEL WITH GFR
ALT: 21 U/L (ref 0–44)
AST: 17 U/L (ref 15–41)
Albumin: 3.9 g/dL (ref 3.5–5.0)
Alkaline Phosphatase: 114 U/L (ref 38–126)
Anion gap: 12 (ref 5–15)
BUN: 8 mg/dL (ref 6–20)
CO2: 19 mmol/L — ABNORMAL LOW (ref 22–32)
Calcium: 9.6 mg/dL (ref 8.9–10.3)
Chloride: 106 mmol/L (ref 98–111)
Creatinine, Ser: 0.69 mg/dL (ref 0.44–1.00)
GFR, Estimated: >60 mL/min (ref >60)
Glucose, Bld: 105 mg/dL — ABNORMAL HIGH (ref 70–99)
Potassium: 3.9 mmol/L (ref 3.5–5.1)
Sodium: 137 mmol/L (ref 135–145)
Total Bilirubin: 0.5 mg/dL (ref 0.0–1.2)
Total Protein: 7.4 g/dL (ref 6.5–8.1)

## 2023-07-28 LAB — TSH: TSH: 3.255 u[IU]/mL (ref 0.350–4.500)

## 2023-07-28 LAB — CBC WITH DIFFERENTIAL/PLATELET
Abs Immature Granulocytes: 0.01 10*3/uL (ref 0.00–0.07)
Basophils Absolute: 0.1 10*3/uL (ref 0.0–0.1)
Basophils Relative: 1 %
Eosinophils Absolute: 0.1 10*3/uL (ref 0.0–0.5)
Eosinophils Relative: 2 %
HCT: 38.8 % (ref 36.0–46.0)
Hemoglobin: 12 g/dL (ref 12.0–15.0)
Immature Granulocytes: 0 %
Lymphocytes Relative: 34 %
Lymphs Abs: 2.4 10*3/uL (ref 0.7–4.0)
MCH: 25.9 pg — ABNORMAL LOW (ref 26.0–34.0)
MCHC: 30.9 g/dL (ref 30.0–36.0)
MCV: 83.8 fL (ref 80.0–100.0)
Monocytes Absolute: 0.6 10*3/uL (ref 0.1–1.0)
Monocytes Relative: 8 %
Neutro Abs: 4 10*3/uL (ref 1.7–7.7)
Neutrophils Relative %: 55 %
Platelets: 317 10*3/uL (ref 150–400)
RBC: 4.63 MIL/uL (ref 3.87–5.11)
RDW: 14.9 % (ref 11.5–15.5)
WBC: 7.2 10*3/uL (ref 4.0–10.5)
nRBC: 0 % (ref 0.0–0.2)

## 2023-07-28 LAB — ETHANOL: Alcohol, Ethyl (B): <15 mg/dL (ref <15)

## 2023-07-28 LAB — LIPID PANEL
Cholesterol: 146 mg/dL (ref 0–200)
HDL: 41 mg/dL (ref >40)
LDL Cholesterol: 95 mg/dL (ref 0–99)
Total CHOL/HDL Ratio: 3.6 ratio
Triglycerides: 48 mg/dL (ref <150)
VLDL: 10 mg/dL (ref 0–40)

## 2023-07-28 LAB — POCT URINE DRUG SCREEN - MANUAL ENTRY (I-SCREEN)
POC Amphetamine UR: NOT DETECTED
POC Buprenorphine (BUP): NOT DETECTED
POC Cocaine UR: NOT DETECTED
POC Marijuana UR: POSITIVE — AB
POC Methadone UR: NOT DETECTED
POC Methamphetamine UR: NOT DETECTED
POC Morphine: NOT DETECTED
POC Oxazepam (BZO): NOT DETECTED
POC Oxycodone UR: NOT DETECTED
POC Secobarbital (BAR): NOT DETECTED

## 2023-07-28 LAB — HEMOGLOBIN A1C
Hgb A1c MFr Bld: 5 % (ref 4.8–5.6)
Mean Plasma Glucose: 96.8 mg/dL

## 2023-07-28 LAB — POCT PREGNANCY, URINE
Preg Test, Ur: NEGATIVE
Preg Test, Ur: NEGATIVE

## 2023-07-28 MED ORDER — DIPHENHYDRAMINE HCL 50 MG PO CAPS: 50.0000 mg | ORAL_CAPSULE | Freq: Three times a day (TID) | ORAL | Status: DC | PRN

## 2023-07-28 MED ORDER — LORAZEPAM 2 MG/ML IJ SOLN: 2.0000 mg | Freq: Three times a day (TID) | INTRAMUSCULAR | Status: DC | PRN

## 2023-07-28 MED ORDER — HALOPERIDOL LACTATE 5 MG/ML IJ SOLN: 5.0000 mg | Freq: Three times a day (TID) | INTRAMUSCULAR | Status: DC | PRN

## 2023-07-28 MED ORDER — ALUM & MAG HYDROXIDE-SIMETH 200-200-20 MG/5ML PO SUSP: 30.0000 mL | ORAL | Status: DC | PRN

## 2023-07-28 MED ORDER — HALOPERIDOL 5 MG PO TABS: 5.0000 mg | ORAL_TABLET | Freq: Three times a day (TID) | ORAL | Status: DC | PRN

## 2023-07-28 MED ORDER — MAGNESIUM HYDROXIDE 400 MG/5ML PO SUSP: 30.0000 mL | Freq: Every day | ORAL | Status: DC | PRN

## 2023-07-28 MED ORDER — DIPHENHYDRAMINE HCL 50 MG/ML IJ SOLN: 50.0000 mg | Freq: Three times a day (TID) | INTRAMUSCULAR | Status: DC | PRN

## 2023-07-28 MED ORDER — HALOPERIDOL LACTATE 5 MG/ML IJ SOLN: 10.0000 mg | Freq: Three times a day (TID) | INTRAMUSCULAR | Status: DC | PRN

## 2023-07-28 MED ORDER — HYDROXYZINE HCL 25 MG PO TABS
25.0000 mg | ORAL_TABLET | Freq: Three times a day (TID) | ORAL | Status: DC | PRN
  Administered 2023-07-29: 25 mg via ORAL
  Filled 2023-07-28: qty 1

## 2023-07-28 MED ORDER — TRAZODONE HCL 50 MG PO TABS
50.0000 mg | ORAL_TABLET | Freq: Every evening | ORAL | Status: DC | PRN
  Administered 2023-07-29: 50 mg via ORAL
  Filled 2023-07-28: qty 1

## 2023-07-28 MED ORDER — ACETAMINOPHEN 325 MG PO TABS: 650.0000 mg | ORAL_TABLET | Freq: Four times a day (QID) | ORAL | Status: DC | PRN

## 2023-07-28 NOTE — ED Notes (Signed)
 Pt awake, alert & responsive, reported as walking around at front lobby naked. AVH noted.  Pt resistant to care, guarded and uncooperative. Escorted to unit without incident, resting at present.  Monitoring for safety.

## 2023-07-28 NOTE — Progress Notes (Addendum)
 [facility] LCSW Progress Note   07/28/2023    1:36 PM  Carly Brooks   [phone]   Type of Contact and Topic:  Psychiatric Bed Placement   Pt accepted [facility]  Patient meets inpatient criteria per [facility], NP  The attending provider will be Dr. Berl Breed  Call report to [phone]  Arlan Belling, RN @ [facility] notified.     Pt scheduled  to arrive at [facility] (5/12).    Phares Brasher, MSW, LCSW-A  1:38 PM 07/28/2023

## 2023-07-28 NOTE — Progress Notes (Signed)
 Patient has been denied by [facility] due to no appropriate beds available. Patient meets [facility] inpatient criteria per [facility], NP. Patient has been faxed out to the following facilities:   [facility] [address] [place] [address] [phone]  [facility] [address] [place] [address] [phone]  [facility] [address] [place], [place] [address] [phone]  [facility] Patient Placement [facility], [place] [phone]  [facility] [address] [place] [address] [phone]  [facility] [address] [place] [address] [phone]  [facility] [address] [facility] [place]ina Nicks [place] [address] [phone]  [facility] [address] [place] [address] [phone]  [facility] [address] [place] [address] [phone]  [facility] EFAX [address] [place], [place] [phone]  [facility] [address] Johnella Naas [place] [address] [phone]  [facility] [address], [place] [address] [phone]  [facility] [address] Johnella Naas [place] [address] [phone]  [facility] [address] [place] [address] [phone]  [facility] [address] [place] [address] [phone]  [name] [address] [place] [address] [phone]  [facility] [address] [place] [address] [phone]  [facility] [address] [place], [place] [address] [phone]   Carly Brooks, MSW, LCSW-A  11:37 AM 07/28/2023
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
## 2023-07-28 NOTE — ED Provider Notes (Signed)
 Behavioral Health Progress Note  Date and Time: 07/28/2023 11:29 AM Name: Carly Brooks MRN:  [id number]  Subjective: Patient states "cannot remember what happened before I got here I remember being in a cold room."  Ultimately patient vaguely recalls disrobing prior to admission to [facility] behavioral health.  Patient states "I think being in the hospital helps me, they say I have schizophrenia but I do not know for sure."  Carly Brooks is reassessed by this nurse practitioner face-to-face.  She is reclined in observation area upon my approach.  Patient has blanket pulled over her head, she removes blanket to speak with this Clinical research associate.  Patient is alert and oriented.  She presents with depressed mood, congruent affect.  Patient is a limited historian.  She states "I am not sure about everything that happened but the police left me in a cold room."  Patient states "I hear voices sometimes not this minute.  Earlier a girl was singing in here and that is when it happens."  She does not elaborate on content of auditory hallucinations.  Denies command auditory hallucinations.  There is no indication that patient is responding to internal stimuli at this time. Patient denies suicidal and homicidal ideation.  She denies visual hallucinations.  Carly Brooks's history, per chart review,  includes Schizophrenia, bipolar disorder and cannabis-induced psychotic disorder. Recent admissions include [facility] on 07/13/2023 and [facility] 07/01/2023- 07/12/2023. Patient tells me she has not established with outpatient psychiatry and typically receives her Invega  Remonia Carmin "in the hospital." Invega  sustenna 156mg  administered most recently on 07/10/2023.  Patient reports she resides in [place] with her cousin and 2 young children.  She denies access to weapons.  She is not employed.  She denies alcohol and substance use.  Patient is willing to follow-up with outpatient psychiatry moving  forward.  Unable to recall previous mental health providers.   Patient remains voluntary.  She verbalizes understanding of treatment plan including disposition inpatient psychiatric treatment. Patient offered support and encouragement.  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, depression Past Medical History: History bilateral tubal ligation Family History: None reported Family Psychiatric  History: None reported Social History: Patient resides with a cousin per her report, history indicates cannabis use disorder, tox screen positive for cannabis, patient denies to use  Additional Social History:    Pain Medications: SEE MAR Prescriptions: SEE MAR Over the Counter: SEE MAR History of alcohol / drug use?: Yes Longest period of sobriety (when/how long): varies Negative Consequences of Use:  (Patient denies) Withdrawal Symptoms: None Name of Substance 1: UTA 1 - Age of First Use: UTA 1 - Amount (size/oz): UTA 1 - Frequency: UTA 1 - Duration: UTA 1 - Last Use / Amount: UTA 1 - Method of Aquiring: UTA 1- Route of Use: UTA Name of Substance 2: UTA 2 - Age of First Use: uta 2 - Amount (size/oz): uta 2 - Frequency: uta 2 - Duration: uta 2 - Last Use / Amount: uta 2 - Method of Aquiring: UTA 2 - Route of Substance Use: UTA                Sleep: Fair  Appetite:  Fair  Current Medications:  Current Facility-Administered Medications  Medication Dose Route Frequency Provider Last Rate Last Admin   acetaminophen  (TYLENOL )  tablet 650 mg  650 mg Oral Q6H PRN Ajibola, Ene A, NP       alum & mag hydroxide-simeth (MAALOX/MYLANTA) 200-200-20 MG/5ML suspension 30 mL  30 mL Oral Q4H PRN Ajibola, Ene A, NP       hydrOXYzine  (ATARAX ) tablet 25 mg   25 mg Oral TID PRN Ajibola, Ene A, NP       magnesium  hydroxide (MILK OF MAGNESIA) suspension 30 mL  30 mL Oral Daily PRN Ajibola, Ene A, NP       traZODone  (DESYREL ) tablet 50 mg  50 mg Oral QHS PRN Ajibola, Ene A, NP       Current Outpatient Medications  Medication Sig Dispense Refill   FLUoxetine  (PROZAC ) 20 MG capsule Take 1 capsule (20 mg total) by mouth daily. 30 capsule 0   hydrOXYzine  (ATARAX ) 25 MG tablet Take 1 tablet (25 mg total) by mouth 3 (three) times daily as needed for anxiety. 30 tablet 0   traZODone  (DESYREL ) 150 MG tablet Take 1 tablet (150 mg total) by mouth at bedtime as needed for sleep. 30 tablet 0   [START ON 08/07/2023] paliperidone  (INVEGA  SUSTENNA) 156 MG/ML SUSY injection Inject 1 mL (156 mg total) into the muscle every 28 (twenty-eight) days. Patient last received on 07/10/23 and is due for next LAI maintenance dose of 156 mg on 08/07/23 1 mL 2   polyethylene glycol (MIRALAX  / GLYCOLAX ) 17 g packet Take 17 g by mouth daily. (Patient not taking: Reported on 07/28/2023) 30 packet 0    Labs  Lab Results:  Admission on 07/28/2023  Component Date Value Ref Range Status   WBC 07/28/2023 7.2  4.0 - 10.5 K/uL Final   RBC 07/28/2023 4.63  3.87 - 5.11 MIL/uL Final   Hemoglobin 07/28/2023 12.0  12.0 - 15.0 g/dL Final   HCT 16/12/9602 38.8  36.0 - 46.0 % Final   MCV 07/28/2023 83.8  80.0 - 100.0 fL Final   MCH 07/28/2023 25.9 (L)  26.0 - 34.0 pg Final   MCHC 07/28/2023 30.9  30.0 - 36.0 g/dL Final   RDW 54/11/8117 14.9  11.5 - 15.5 % Final   Platelets 07/28/2023 317  150 - 400 K/uL Final   nRBC 07/28/2023 0.0  0.0 - 0.2 % Final   Neutrophils Relative % 07/28/2023 55  % Final   Neutro Abs 07/28/2023 4.0  1.7 - 7.7 K/uL Final   Lymphocytes Relative 07/28/2023 34  % Final   Lymphs Abs 07/28/2023 2.4  0.7 - 4.0 K/uL Final   Monocytes Relative 07/28/2023 8  % Final   Monocytes Absolute 07/28/2023 0.6  0.1 - 1.0 K/uL Final   Eosinophils Relative 07/28/2023 2  % Final    Eosinophils Absolute 07/28/2023 0.1  0.0 - 0.5 K/uL Final   Basophils Relative 07/28/2023 1  % Final   Basophils Absolute 07/28/2023 0.1  0.0 - 0.1 K/uL Final   Immature Granulocytes 07/28/2023 0  % Final   Abs Immature Granulocytes 07/28/2023 0.01  0.00 - 0.07 K/uL Final   Performed at [facility], [address], [place], [place] [address]   Sodium 07/28/2023 137  135 - 145 mmol/L Final   Potassium 07/28/2023 3.9  3.5 - 5.1 mmol/L Final   Chloride 07/28/2023 106  98 - 111 mmol/L Final   CO2 07/28/2023 19 (L)  22 - 32 mmol/L Final   Glucose, Bld 07/28/2023 105 (H)  70 - 99 mg/dL Final   Glucose reference range applies only to samples  taken after fasting for at least 8 hours.   BUN 07/28/2023 8  6 - 20 mg/dL Final   Creatinine, Ser 07/28/2023 0.69  0.44 - 1.00 mg/dL Final   Calcium 13/10/6576 9.6  8.9 - 10.3 mg/dL Final   Total Protein 46/96/2952 7.4  6.5 - 8.1 g/dL Final   Albumin 84/13/2440 3.9  3.5 - 5.0 g/dL Final   AST 01/14/2535 17  15 - 41 U/L Final   ALT 07/28/2023 21  0 - 44 U/L Final   Alkaline Phosphatase 07/28/2023 114  38 - 126 U/L Final   Total Bilirubin 07/28/2023 0.5  0.0 - 1.2 mg/dL Final   GFR, Estimated 07/28/2023 >60  >60 mL/min Final   Comment: (NOTE) Calculated using the CKD-EPI Creatinine Equation (2021)    Anion gap 07/28/2023 12  5 - 15 Final   Performed at [facility], [address] [place], [place] [address]   Hgb A1c MFr Bld 07/28/2023 5.0  4.8 - 5.6 % Final   Comment: (NOTE) Pre diabetes:          5.7%-6.4%  Diabetes:              >6.4%  Glycemic control for   <7.0% adults with diabetes    Mean Plasma Glucose 07/28/2023 96.8  mg/dL Final   Performed at [facility], [address], [place], [place] [address]   Alcohol, Ethyl (B) 07/28/2023 <15  <15 mg/dL Final   Comment: Please note change in reference range. (NOTE) For medical purposes only. Performed at [facility], [address] [place], [place] [address]     Cholesterol 07/28/2023 146  0 - 200 mg/dL Final   Triglycerides 75/64/3329 48  <150 mg/dL Final   HDL 51/88/4166 41  >40 mg/dL Final   Total CHOL/HDL Ratio 07/28/2023 3.6  RATIO Final   VLDL 07/28/2023 10  0 - 40 mg/dL Final   LDL Cholesterol 07/28/2023 95  0 - 99 mg/dL Final   Comment:        Total Cholesterol/HDL:CHD Risk Coronary Heart Disease Risk Table                     Men   Women  1/2 Average Risk   3.4   3.3  Average Risk       5.0   4.4  2 X Average Risk   9.6   7.1  3 X Average Risk  23.4   11.0        Use the calculated Patient Ratio above and the CHD Risk Table to determine the patient's CHD Risk.        ATP III CLASSIFICATION (LDL):  <100     mg/dL   Optimal  063-016  mg/dL   Near or Above                    Optimal  130-159  mg/dL   Borderline  010-932  mg/dL   High  >355     mg/dL   Very High Performed at [facility], [address], [place], [place] [address]    TSH 07/28/2023 3.255  0.350 - 4.500 uIU/mL Final   Comment: Performed by a 3rd Generation assay with a functional sensitivity of <=0.01 uIU/mL. Performed at [facility], [address], [place], [place] [address]    POC Amphetamine UR 07/28/2023 None Detected  NONE DETECTED (Cut Off Level 1000 ng/mL) Final  POC Secobarbital (BAR) 07/28/2023 None Detected  NONE DETECTED (Cut Off Level 300 ng/mL) Final   POC Buprenorphine (BUP) 07/28/2023 None Detected  NONE DETECTED (Cut Off Level 10 ng/mL) Final   POC Oxazepam (BZO) 07/28/2023 None Detected  NONE DETECTED (Cut Off Level 300 ng/mL) Final   POC Cocaine UR 07/28/2023 None Detected  NONE DETECTED (Cut Off Level 300 ng/mL) Final   POC Methamphetamine UR 07/28/2023 None Detected  NONE DETECTED (Cut Off Level 1000 ng/mL) Final   POC Morphine  07/28/2023 None Detected  NONE DETECTED (Cut Off Level 300 ng/mL) Final   POC Methadone UR 07/28/2023 None Detected  NONE DETECTED (Cut Off Level 300 ng/mL) Final   POC Oxycodone  UR 07/28/2023 None  Detected  NONE DETECTED (Cut Off Level 100 ng/mL) Final   POC Marijuana UR 07/28/2023 Positive (A)  NONE DETECTED (Cut Off Level 50 ng/mL) Final   Preg Test, Ur 07/28/2023 NEGATIVE  NEGATIVE Final   Comment:        THE SENSITIVITY OF THIS METHODOLOGY IS >24 mIU/mL    Preg Test, Ur 07/28/2023 NEGATIVE  NEGATIVE Final   Comment:        THE SENSITIVITY OF THIS METHODOLOGY IS >24 mIU/mL   Admission on 07/12/2023, Discharged on 07/13/2023  Component Date Value Ref Range Status   WBC 07/12/2023 8.6  4.0 - 10.5 K/uL Final   RBC 07/12/2023 4.86  3.87 - 5.11 MIL/uL Final   Hemoglobin 07/12/2023 12.6  12.0 - 15.0 g/dL Final   HCT 21/30/8657 40.5  36.0 - 46.0 % Final   MCV 07/12/2023 83.3  80.0 - 100.0 fL Final   MCH 07/12/2023 25.9 (L)  26.0 - 34.0 pg Final   MCHC 07/12/2023 31.1  30.0 - 36.0 g/dL Final   RDW 84/69/6295 14.8  11.5 - 15.5 % Final   Platelets 07/12/2023 335  150 - 400 K/uL Final   nRBC 07/12/2023 0.0  0.0 - 0.2 % Final   Neutrophils Relative % 07/12/2023 67  % Final   Neutro Abs 07/12/2023 5.8  1.7 - 7.7 K/uL Final   Lymphocytes Relative 07/12/2023 23  % Final   Lymphs Abs 07/12/2023 2.0  0.7 - 4.0 K/uL Final   Monocytes Relative 07/12/2023 8  % Final   Monocytes Absolute 07/12/2023 0.6  0.1 - 1.0 K/uL Final   Eosinophils Relative 07/12/2023 1  % Final   Eosinophils Absolute 07/12/2023 0.0  0.0 - 0.5 K/uL Final   Basophils Relative 07/12/2023 1  % Final   Basophils Absolute 07/12/2023 0.1  0.0 - 0.1 K/uL Final   Immature Granulocytes 07/12/2023 0  % Final   Abs Immature Granulocytes 07/12/2023 0.01  0.00 - 0.07 K/uL Final   Performed at [facility], [address], [place], [place] [address]   Sodium 07/12/2023 140  135 - 145 mmol/L Final   Potassium 07/12/2023 4.0  3.5 - 5.1 mmol/L Final   Chloride 07/12/2023 106  98 - 111 mmol/L Final   CO2 07/12/2023 23  22 - 32 mmol/L Final   Glucose, Bld 07/12/2023 104 (H)  70 - 99 mg/dL Final   Glucose reference range  applies only to samples taken after fasting for at least 8 hours.   BUN 07/12/2023 6  6 - 20 mg/dL Final   Creatinine, Ser 07/12/2023 0.79  0.44 - 1.00 mg/dL Final   Calcium 24/40/1027 9.9  8.9 - 10.3 mg/dL Final   Total Protein 25/36/6440 7.4  6.5 - 8.1 g/dL Final  Albumin 07/12/2023 3.9  3.5 - 5.0 g/dL Final   AST 62/13/0865 20  15 - 41 U/L Final   ALT 07/12/2023 23  0 - 44 U/L Final   Alkaline Phosphatase 07/12/2023 94  38 - 126 U/L Final   Total Bilirubin 07/12/2023 0.4  0.0 - 1.2 mg/dL Final   GFR, Estimated 07/12/2023 >60  >60 mL/min Final   Comment: (NOTE) Calculated using the CKD-EPI Creatinine Equation (2021)    Anion gap 07/12/2023 11  5 - 15 Final   Performed at [facility], [address], [place], [place] [address]   Magnesium  07/12/2023 2.0  1.7 - 2.4 mg/dL Final   Performed at [facility], [address], [place], [place] [address]   Alcohol, Ethyl (B) 07/12/2023 <15  <15 mg/dL Final   Comment: Please note change in reference range. (NOTE) For medical purposes only. Performed at [facility], [address], [place], [place] [address]    TSH 07/12/2023 2.632  0.350 - 4.500 uIU/mL Final   Comment: Performed by a 3rd Generation assay with a functional sensitivity of <=0.01 uIU/mL. Performed at [facility], [address] [place], [place] [address]    Prolactin 07/12/2023 14.2  4.8 - 33.4 ng/mL Final   Comment: (NOTE) Performed At: [facility] [address] [place], [place] [id number] Pearlean Botts MD IH:[id number]    POC Amphetamine UR 07/12/2023 None Detected  NONE DETECTED (Cut Off Level 1000 ng/mL) Final   POC Secobarbital (BAR) 07/12/2023 None Detected  NONE DETECTED (Cut Off Level 300 ng/mL) Final   POC Buprenorphine (BUP) 07/12/2023 None Detected  NONE DETECTED (Cut Off Level 10 ng/mL) Final   POC Oxazepam (BZO) 07/12/2023 Positive (A)  NONE DETECTED (Cut Off Level 300 ng/mL) Final   POC Cocaine UR 07/12/2023 None  Detected  NONE DETECTED (Cut Off Level 300 ng/mL) Final   POC Methamphetamine UR 07/12/2023 None Detected  NONE DETECTED (Cut Off Level 1000 ng/mL) Final   POC Morphine  07/12/2023 None Detected  NONE DETECTED (Cut Off Level 300 ng/mL) Final   POC Methadone UR 07/12/2023 None Detected  NONE DETECTED (Cut Off Level 300 ng/mL) Final   POC Oxycodone  UR 07/12/2023 None Detected  NONE DETECTED (Cut Off Level 100 ng/mL) Final   POC Marijuana UR 07/12/2023 Positive (A)  NONE DETECTED (Cut Off Level 50 ng/mL) Final   Glucose-Capillary 07/12/2023 135 (H)  70 - 99 mg/dL Final   Glucose reference range applies only to samples taken after fasting for at least 8 hours.  Admission on 07/01/2023, Discharged on 07/12/2023  Component Date Value Ref Range Status   WBC 07/03/2023 5.8  4.0 - 10.5 K/uL Final   RBC 07/03/2023 4.93  3.87 - 5.11 MIL/uL Final   Hemoglobin 07/03/2023 12.8  12.0 - 15.0 g/dL Final   HCT 75/64/3329 41.7  36.0 - 46.0 % Final   MCV 07/03/2023 84.6  80.0 - 100.0 fL Final   MCH 07/03/2023 26.0  26.0 - 34.0 pg Final   MCHC 07/03/2023 30.7  30.0 - 36.0 g/dL Final   RDW 51/88/4166 15.4  11.5 - 15.5 % Final   Platelets 07/03/2023 363  150 - 400 K/uL Final   nRBC 07/03/2023 0.0  0.0 - 0.2 % Final   Neutrophils Relative % 07/03/2023 54  % Final   Neutro Abs 07/03/2023 3.1  1.7 - 7.7 K/uL Final   Lymphocytes Relative 07/03/2023 34  % Final   Lymphs Abs 07/03/2023 2.0  0.7 - 4.0 K/uL Final   Monocytes Relative 07/03/2023 9  % Final   Monocytes Absolute 07/03/2023 0.5  0.1 - 1.0 K/uL Final   Eosinophils Relative 07/03/2023 2  % Final   Eosinophils Absolute 07/03/2023 0.1  0.0 - 0.5 K/uL Final   Basophils Relative 07/03/2023 1  % Final   Basophils Absolute 07/03/2023 0.1  0.0 - 0.1 K/uL Final   Immature Granulocytes 07/03/2023 0  % Final   Abs Immature Granulocytes 07/03/2023 0.01  0.00 - 0.07 K/uL Final   Performed at [facility], [address], [place], [place] [address]    Sodium 07/03/2023 137  135 - 145 mmol/L Final   Potassium 07/03/2023 3.6  3.5 - 5.1 mmol/L Final   Chloride 07/03/2023 105  98 - 111 mmol/L Final   CO2 07/03/2023 21 (L)  22 - 32 mmol/L Final   Glucose, Bld 07/03/2023 97  70 - 99 mg/dL Final   Glucose reference range applies only to samples taken after fasting for at least 8 hours.   BUN 07/03/2023 6  6 - 20 mg/dL Final   Creatinine, Ser 07/03/2023 0.75  0.44 - 1.00 mg/dL Final   Calcium 84/69/6295 9.5  8.9 - 10.3 mg/dL Final   Total Protein 28/41/3244 7.9  6.5 - 8.1 g/dL Final   Albumin 03/22/7251 4.0  3.5 - 5.0 g/dL Final   AST 66/44/0347 50 (H)  15 - 41 U/L Final   ALT 07/03/2023 86 (H)  0 - 44 U/L Final   Alkaline Phosphatase 07/03/2023 114  38 - 126 U/L Final   Total Bilirubin 07/03/2023 0.8  0.0 - 1.2 mg/dL Final   GFR, Estimated 07/03/2023 >60  >60 mL/min Final   Comment: (NOTE) Calculated using the CKD-EPI Creatinine Equation (2021)    Anion gap 07/03/2023 11  5 - 15 Final   Performed at [facility], [address], [place], [place] [address]   Vitamin B-12 07/03/2023 409  180 - 914 pg/mL Final   Comment: (NOTE) This assay is not validated for testing neonatal or myeloproliferative syndrome specimens for Vitamin B12 levels. Performed at [facility], [address], [place], [place] [address]    Folate 07/03/2023 14.1  >5.9 ng/mL Final   Performed at [facility], [address] [place], [place] [address]  Admission on 06/30/2023, Discharged on 07/01/2023  Component Date Value Ref Range Status   Sodium 06/30/2023 136  135 - 145 mmol/L Final   Potassium 06/30/2023 3.8  3.5 - 5.1 mmol/L Final   Chloride 06/30/2023 106  98 - 111 mmol/L Final   CO2 06/30/2023 24  22 - 32 mmol/L Final   Glucose, Bld 06/30/2023 109 (H)  70 - 99 mg/dL Final   Glucose reference range applies only to samples taken after fasting for at least 8 hours.   BUN 06/30/2023 8  6 - 20 mg/dL  Final   Creatinine, Ser 06/30/2023 0.67  0.44 - 1.00 mg/dL Final   Calcium 95/18/8416 9.0  8.9 - 10.3 mg/dL Final   Total Protein 60/63/0160 7.8  6.5 - 8.1 g/dL Final   Albumin 10/93/2355 3.8  3.5 - 5.0 g/dL Final   AST 73/22/0254 95 (H)  15 - 41 U/L Final   ALT 06/30/2023 96 (H)  0 - 44 U/L Final   Alkaline Phosphatase 06/30/2023 115  38 - 126 U/L Final   Total Bilirubin 06/30/2023 0.3  0.0 - 1.2 mg/dL Final   GFR,  Estimated 06/30/2023 >60  >60 mL/min Final   Comment: (NOTE) Calculated using the CKD-EPI Creatinine Equation (2021)    Anion gap 06/30/2023 6  5 - 15 Final   Performed at [facility], [address], [place], [place] [address]   Alcohol, Ethyl (B) 06/30/2023 <10  <10 mg/dL Final   Comment: (NOTE) Lowest detectable limit for serum alcohol is 10 mg/dL.  For medical purposes only. Performed at [facility], [address] [place], [place] [address]    Opiates 06/30/2023 NONE DETECTED  NONE DETECTED Final   Cocaine 06/30/2023 NONE DETECTED  NONE DETECTED Final   Benzodiazepines 06/30/2023 NONE DETECTED  NONE DETECTED Final   Amphetamines 06/30/2023 NONE DETECTED  NONE DETECTED Final   Tetrahydrocannabinol 06/30/2023 POSITIVE (A)  NONE DETECTED Final   Barbiturates 06/30/2023 NONE DETECTED  NONE DETECTED Final   Comment: (NOTE) DRUG SCREEN FOR MEDICAL PURPOSES ONLY.  IF CONFIRMATION IS NEEDED FOR ANY PURPOSE, NOTIFY LAB WITHIN 5 DAYS.  LOWEST DETECTABLE LIMITS FOR URINE DRUG SCREEN Drug Class                     Cutoff (ng/mL) Amphetamine and metabolites    1000 Barbiturate and metabolites    200 Benzodiazepine                 200 Opiates and metabolites        300 Cocaine and metabolites        300 THC                            50 Performed at [facility], [address] [place], [place] [address]    WBC 06/30/2023 6.6  4.0 - 10.5 K/uL Final   RBC 06/30/2023 4.54  3.87 - 5.11 MIL/uL Final    Hemoglobin 06/30/2023 11.9 (L)  12.0 - 15.0 g/dL Final   HCT 91/47/8295 38.5  36.0 - 46.0 % Final   MCV 06/30/2023 84.8  80.0 - 100.0 fL Final   MCH 06/30/2023 26.2  26.0 - 34.0 pg Final   MCHC 06/30/2023 30.9  30.0 - 36.0 g/dL Final   RDW 62/13/0865 15.4  11.5 - 15.5 % Final   Platelets 06/30/2023 338  150 - 400 K/uL Final   nRBC 06/30/2023 0.0  0.0 - 0.2 % Final   Neutrophils Relative % 06/30/2023 60  % Final   Neutro Abs 06/30/2023 4.0  1.7 - 7.7 K/uL Final   Lymphocytes Relative 06/30/2023 29  % Final   Lymphs Abs 06/30/2023 1.9  0.7 - 4.0 K/uL Final   Monocytes Relative 06/30/2023 8  % Final   Monocytes Absolute 06/30/2023 0.5  0.1 - 1.0 K/uL Final   Eosinophils Relative 06/30/2023 2  % Final   Eosinophils Absolute 06/30/2023 0.1  0.0 - 0.5 K/uL Final   Basophils Relative 06/30/2023 1  % Final   Basophils Absolute 06/30/2023 0.1  0.0 - 0.1 K/uL Final   Immature Granulocytes 06/30/2023 0  % Final   Abs Immature Granulocytes 06/30/2023 0.02  0.00 - 0.07 K/uL Final   Performed at [facility], [address] [place], [place] [address]   Preg, Serum 06/30/2023 NEGATIVE  NEGATIVE Final   Comment:        THE SENSITIVITY OF THIS METHODOLOGY IS >10 mIU/mL. Performed at [facility], [address] [place], [place] [address]   Admission on 06/09/2023,  Discharged on 06/10/2023  Component Date Value Ref Range Status   WBC 06/09/2023 7.3  4.0 - 10.5 K/uL Final   RBC 06/09/2023 4.97  3.87 - 5.11 MIL/uL Final   Hemoglobin 06/09/2023 12.9  12.0 - 15.0 g/dL Final   HCT 14/78/2956 41.5  36.0 - 46.0 % Final   MCV 06/09/2023 83.5  80.0 - 100.0 fL Final   MCH 06/09/2023 26.0  26.0 - 34.0 pg Final   MCHC 06/09/2023 31.1  30.0 - 36.0 g/dL Final   RDW 21/30/8657 15.4  11.5 - 15.5 % Final   Platelets 06/09/2023 337  150 - 400 K/uL Final   nRBC 06/09/2023 0.0  0.0 - 0.2 % Final   Neutrophils Relative % 06/09/2023 65  % Final   Neutro Abs 06/09/2023 4.8  1.7  - 7.7 K/uL Final   Lymphocytes Relative 06/09/2023 25  % Final   Lymphs Abs 06/09/2023 1.8  0.7 - 4.0 K/uL Final   Monocytes Relative 06/09/2023 8  % Final   Monocytes Absolute 06/09/2023 0.6  0.1 - 1.0 K/uL Final   Eosinophils Relative 06/09/2023 1  % Final   Eosinophils Absolute 06/09/2023 0.0  0.0 - 0.5 K/uL Final   Basophils Relative 06/09/2023 1  % Final   Basophils Absolute 06/09/2023 0.1  0.0 - 0.1 K/uL Final   Immature Granulocytes 06/09/2023 0  % Final   Abs Immature Granulocytes 06/09/2023 0.01  0.00 - 0.07 K/uL Final   Performed at [facility], [address], [place], [place] [address]   Sodium 06/09/2023 136  135 - 145 mmol/L Final   Potassium 06/09/2023 4.1  3.5 - 5.1 mmol/L Final   Chloride 06/09/2023 105  98 - 111 mmol/L Final   CO2 06/09/2023 22  22 - 32 mmol/L Final   Glucose, Bld 06/09/2023 89  70 - 99 mg/dL Final   Glucose reference range applies only to samples taken after fasting for at least 8 hours.   BUN 06/09/2023 6  6 - 20 mg/dL Final   Creatinine, Ser 06/09/2023 0.66  0.44 - 1.00 mg/dL Final   Calcium 29/52/8413 9.5  8.9 - 10.3 mg/dL Final   Total Protein 24/40/1027 7.4  6.5 - 8.1 g/dL Final   Albumin 25/36/6440 4.1  3.5 - 5.0 g/dL Final   AST 34/74/2595 22  15 - 41 U/L Final   ALT 06/09/2023 17  0 - 44 U/L Final   Alkaline Phosphatase 06/09/2023 97  38 - 126 U/L Final   Total Bilirubin 06/09/2023 0.9  0.0 - 1.2 mg/dL Final   GFR, Estimated 06/09/2023 >60  >60 mL/min Final   Comment: (NOTE) Calculated using the CKD-EPI Creatinine Equation (2021)    Anion gap 06/09/2023 9  5 - 15 Final   Performed at [facility], [address], [place], [place] [address]   Hgb A1c MFr Bld 06/09/2023 4.9  4.8 - 5.6 % Final   Comment: (NOTE) Pre diabetes:          5.7%-6.4%  Diabetes:              >6.4%  Glycemic control for   <7.0% adults with diabetes    Mean Plasma Glucose 06/09/2023 93.93  mg/dL Final   Performed at [facility], [address] [place], [place] [address]   Alcohol, Ethyl (B) 06/09/2023 <10  <10 mg/dL Final   Comment: (NOTE) Lowest detectable limit for serum alcohol is 10 mg/dL.  For medical purposes only.  Performed at [facility], [address], [place], [place] [address]    Cholesterol 06/09/2023 154  0 - 200 mg/dL Final   Triglycerides 60/45/4098 51  <150 mg/dL Final   HDL 11/91/4782 43  >40 mg/dL Final   Total CHOL/HDL Ratio 06/09/2023 3.6  RATIO Final   VLDL 06/09/2023 10  0 - 40 mg/dL Final   LDL Cholesterol 06/09/2023 101 (H)  0 - 99 mg/dL Final   Comment:        Total Cholesterol/HDL:CHD Risk Coronary Heart Disease Risk Table                     Men   Women  1/2 Average Risk   3.4   3.3  Average Risk       5.0   4.4  2 X Average Risk   9.6   7.1  3 X Average Risk  23.4   11.0        Use the calculated Patient Ratio above and the CHD Risk Table to determine the patient's CHD Risk.        ATP III CLASSIFICATION (LDL):  <100     mg/dL   Optimal  956-213  mg/dL   Near or Above                    Optimal  130-159  mg/dL   Borderline  086-578  mg/dL   High  >469     mg/dL   Very High Performed at [facility], [address] [place], [place] [address]    RPR Ser Ql 06/09/2023 NON REACTIVE  NON REACTIVE Final   Performed at [facility], [address] [place], [place] [address]   Color, Urine 06/09/2023 YELLOW  YELLOW Final   APPearance 06/09/2023 CLEAR  CLEAR Final   Specific Gravity, Urine 06/09/2023 <1.005 (L)  1.005 - 1.030 Final   pH 06/09/2023 6.5  5.0 - 8.0 Final   Glucose, UA 06/09/2023 NEGATIVE  NEGATIVE mg/dL Final   Hgb urine dipstick 06/09/2023 LARGE (A)  NEGATIVE Final   Bilirubin Urine 06/09/2023 NEGATIVE  NEGATIVE Final   Ketones, ur 06/09/2023 NEGATIVE  NEGATIVE mg/dL Final   Protein, ur 44/03/270 NEGATIVE  NEGATIVE mg/dL Final   Nitrite 53/66/4403 NEGATIVE  NEGATIVE Final   Leukocytes,Ua 06/09/2023 NEGATIVE  NEGATIVE Final   Performed at [facility], [address], [place], [place] [address]   Preg Test, Ur 06/09/2023 Negative  Negative Final   POC Amphetamine UR 06/09/2023 None Detected  NONE DETECTED (Cut Off Level 1000 ng/mL) Final   POC Secobarbital (BAR) 06/09/2023 None Detected  NONE DETECTED (Cut Off Level 300 ng/mL) Final   POC Buprenorphine (BUP) 06/09/2023 None Detected  NONE DETECTED (Cut Off Level 10 ng/mL) Final   POC Oxazepam (BZO) 06/09/2023 None Detected  NONE DETECTED (Cut Off Level 300 ng/mL) Final   POC Cocaine UR 06/09/2023 None Detected  NONE DETECTED (Cut Off Level 300 ng/mL) Final   POC Methamphetamine UR 06/09/2023 None Detected  NONE DETECTED (Cut Off Level 1000 ng/mL) Final   POC Morphine  06/09/2023 None Detected  NONE DETECTED (Cut Off Level 300 ng/mL) Final   POC Methadone UR 06/09/2023 None Detected  NONE DETECTED (Cut Off Level 300 ng/mL) Final   POC Oxycodone  UR 06/09/2023 None Detected  NONE DETECTED (Cut Off Level 100 ng/mL) Final   POC Marijuana UR 06/09/2023 Positive (A)  NONE DETECTED (Cut Off  Level 50 ng/mL) Final   HIV Screen 4th Generation wRfx 06/09/2023 Non Reactive  Non Reactive Final   Performed at [facility], [address], [place], [place] [address]   TSH 06/09/2023 2.309  0.350 - 4.500 uIU/mL Final   Comment: Performed by a 3rd Generation assay with a functional sensitivity of <=0.01 uIU/mL. Performed at [facility], [address], [place], [place] [address]    RBC / HPF 06/09/2023 0-5  0 - 5 RBC/hpf Final   WBC, UA 06/09/2023 NONE SEEN  0 - 5 WBC/hpf Final   Bacteria, UA 06/09/2023 RARE (A)  NONE SEEN Final   Squamous Epithelial / HPF 06/09/2023 0-5  0 - 5 /HPF Final   Amorphous Crystal 06/09/2023 PRESENT   Final   Performed at [facility], [address], [place], [place] [address]  Admission on 03/29/2023, Discharged on 03/29/2023  Component Date Value Ref Range Status   WBC 03/29/2023 5.4  4.0 - 10.5 K/uL Final   RBC 03/29/2023 4.79  3.87 - 5.11  MIL/uL Final   Hemoglobin 03/29/2023 12.5  12.0 - 15.0 g/dL Final   HCT 03/22/7251 39.9  36.0 - 46.0 % Final   MCV 03/29/2023 83.3  80.0 - 100.0 fL Final   MCH 03/29/2023 26.1  26.0 - 34.0 pg Final   MCHC 03/29/2023 31.3  30.0 - 36.0 g/dL Final   RDW 66/44/0347 14.3  11.5 - 15.5 % Final   Platelets 03/29/2023 266  150 - 400 K/uL Final   nRBC 03/29/2023 0.0  0.0 - 0.2 % Final   Neutrophils Relative % 03/29/2023 50  % Final   Neutro Abs 03/29/2023 2.7  1.7 - 7.7 K/uL Final   Lymphocytes Relative 03/29/2023 39  % Final   Lymphs Abs 03/29/2023 2.1  0.7 - 4.0 K/uL Final   Monocytes Relative 03/29/2023 10  % Final   Monocytes Absolute 03/29/2023 0.5  0.1 - 1.0 K/uL Final   Eosinophils Relative 03/29/2023 1  % Final   Eosinophils Absolute 03/29/2023 0.0  0.0 - 0.5 K/uL Final   Basophils Relative 03/29/2023 0  % Final   Basophils Absolute 03/29/2023 0.0  0.0 - 0.1 K/uL Final   Immature Granulocytes 03/29/2023 0  % Final   Abs Immature Granulocytes 03/29/2023 0.01  0.00 - 0.07 K/uL Final   Performed at [facility], [address], [place], [place] [address]   Sodium 03/29/2023 136  135 - 145 mmol/L Final   Potassium 03/29/2023 3.6  3.5 - 5.1 mmol/L Final   Chloride 03/29/2023 105  98 - 111 mmol/L Final   CO2 03/29/2023 22  22 - 32 mmol/L Final   Glucose, Bld 03/29/2023 101 (H)  70 - 99 mg/dL Final   Glucose reference range applies only to samples taken after fasting for at least 8 hours.   BUN 03/29/2023 5 (L)  6 - 20 mg/dL Final   Creatinine, Ser 03/29/2023 0.76  0.44 - 1.00 mg/dL Final   Calcium 63/87/5643 9.1  8.9 - 10.3 mg/dL Final   Total Protein 32/95/1884 7.0  6.5 - 8.1 g/dL Final   Albumin 16/60/6301 3.6  3.5 - 5.0 g/dL Final   AST 60/12/9321 32  15 - 41 U/L Final   ALT 03/29/2023 34  0 - 44 U/L Final   Alkaline Phosphatase 03/29/2023 114  38 - 126 U/L Final   Total Bilirubin 03/29/2023 0.4  0.0 - 1.2 mg/dL Final   GFR, Estimated 03/29/2023 >  60  >60 mL/min Final   Comment:  (NOTE) Calculated using the CKD-EPI Creatinine Equation (2021)    Anion gap 03/29/2023 9  5 - 15 Final   Performed at [facility], [address] [place], [place] [address]   Lipase 03/29/2023 43  11 - 51 U/L Final   Performed at [facility], [address] [place], [place] [address]   Color, Urine 03/29/2023 YELLOW  YELLOW Final   APPearance 03/29/2023 CLEAR  CLEAR Final   Specific Gravity, Urine 03/29/2023 1.014  1.005 - 1.030 Final   pH 03/29/2023 7.0  5.0 - 8.0 Final   Glucose, UA 03/29/2023 NEGATIVE  NEGATIVE mg/dL Final   Hgb urine dipstick 03/29/2023 NEGATIVE  NEGATIVE Final   Bilirubin Urine 03/29/2023 NEGATIVE  NEGATIVE Final   Ketones, ur 03/29/2023 NEGATIVE  NEGATIVE mg/dL Final   Protein, ur 21/30/8657 NEGATIVE  NEGATIVE mg/dL Final   Nitrite 84/69/6295 NEGATIVE  NEGATIVE Final   Leukocytes,Ua 03/29/2023 NEGATIVE  NEGATIVE Final   Performed at [facility], [address], [place], [place] [address]   Preg Test, Ur 03/29/2023 NEGATIVE  NEGATIVE Final   Comment:        THE SENSITIVITY OF THIS METHODOLOGY IS >25 mIU/mL. Performed at [facility], [address] [place], [place] [address]   Office Visit on 03/07/2023  Component Date Value Ref Range Status   Hemoglobin A1C 03/07/2023 5.1  4.0 - 5.6 % Final   Glucose-Capillary 03/07/2023 108 (H)  70 - 99 mg/dL Final   Glucose reference range applies only to samples taken after fasting for at least 8 hours.    Blood Alcohol level:  Lab Results  Component Value Date   [facility] <15 07/28/2023   ETH <15 07/12/2023    Metabolic Disorder Labs: Lab Results  Component Value Date   HGBA1C 5.0 07/28/2023   MPG 96.8 07/28/2023   MPG 93.93 06/09/2023   Lab Results  Component Value Date   PROLACTIN 14.2 07/12/2023   Lab Results  Component Value Date   CHOL 146 07/28/2023   TRIG 48 07/28/2023   HDL 41 07/28/2023   CHOLHDL 3.6 07/28/2023   VLDL 10 07/28/2023   LDLCALC 95 07/28/2023   LDLCALC  101 (H) 06/09/2023    Therapeutic Lab Levels: No results found for: "LITHIUM" No results found for: "VALPROATE" No results found for: "CBMZ"  Physical Findings   AIMS    Flowsheet Row [facility]  AIMS Total Score 0      AUDIT    Flowsheet Row Ad[facility] (Discharged[facility] Clinical Support from 05/23/2023 in [facility] Dept Of [place]. [facility]  Alcohol Use Disorder Identification Test Final Score (AUDIT) 6 3      GAD-7    Flowsheet Row Office Visit from 03/07/2023 in [facility] - [facility] Dept Of [place]. [facility] Office Visit from 01/22/2023 in [facility] - [facility] Dept Of [place]. [facility] Routine Prenatal from 04/11/2021 in [facility]  Total GAD-7 Score 14 14 3       PHQ2-9    Flowsheet Row Clinical Support from 05/23/2023 in [facility] - A Dept Of [place]. [facility] Office Visit from 03/07/2023 in [facility] -  A Dept Of [place]. [facility] Office Visit from 01/22/2023 in [facility] - A Dept Of [place]. [facility] Routine Prenatal from 04/11/2021 in [facility]  PHQ-2 Total Score 0 4 4 1   PHQ-9 Total Score 1 12 14 9       Flowsheet Row ED from 07/28/2023 in [facility] ED from 07/12/2023 in [facility] Admission (Discharged) [facility]  C-SSRS RISK CATEGORY Low Risk No Risk No Risk        Musculoskeletal  Strength & Muscle Tone: within normal limits Gait & Station: normal Patient leans: N/A  Psychiatric Specialty Exam  Presentation  General Appearance:  Appropriate for Environment;  Casual  Eye Contact: Fair  Speech: Clear and Coherent  Speech Volume: Decreased  Handedness: Right   Mood and Affect  Mood: Depressed  Affect: Congruent   Thought Process  Thought Processes: Coherent  Descriptions of Associations:Intact  Orientation:Full (Time, Place and Person)  Thought Content:Logical  Diagnosis of Schizophrenia or Schizoaffective disorder in past: Yes  Duration of Psychotic Symptoms: Greater than six months   Hallucinations:Hallucinations: Auditory Description of Auditory Hallucinations: 'I hear voices a lot of the time" Description of Visual Hallucinations: denies currrently  Ideas of Reference:None  Suicidal Thoughts:Suicidal Thoughts: Yes, Passive SI Active Intent and/or Plan: -- (Refused to disclose plan or intent) SI Passive Intent and/or Plan: Without Intent  Homicidal Thoughts:Homicidal Thoughts: No   Sensorium  Memory: Immediate Fair  Judgment: Fair  Insight: Shallow   Executive Functions  Concentration: Fair  Attention Span: Fair  Recall: Poor  Fund of Knowledge: Fair  Language: Fair   Psychomotor Activity  Psychomotor Activity: Psychomotor Activity: Normal   Assets  Assets: Communication Skills; Desire for Improvement; Housing; Health and safety inspector; Physical Health; Social Support; Resilience   Sleep  Sleep: Sleep: Fair Number of Hours of Sleep: 0 (unable to assess)   Nutritional Assessment (For OBS and FBC admissions only) Has the patient had a weight loss or gain of 10 pounds or more in the last 3 months?: No Has the patient had a decrease in food intake/or appetite?: No Does the patient have dental problems?: No Does the patient have eating habits or behaviors that may be indicators of an eating disorder including binging or inducing vomiting?: No Has the patient recently lost weight without trying?: 0 Has the patient been eating poorly because of a decreased appetite?:  0 Malnutrition Screening Tool Score: 0    Physical Exam  Physical Exam Vitals and nursing note reviewed.  Constitutional:      Appearance: Normal appearance. She is well-developed.  HENT:     Head: Normocephalic and atraumatic.     Nose: Nose normal.  Cardiovascular:     Rate and Rhythm: Normal rate.  Pulmonary:     Effort: Pulmonary effort is normal.  Musculoskeletal:        General: Normal range of motion.     Cervical back: Normal range of motion.  Skin:    General: Skin is warm and dry.  Neurological:     Mental Status: She is alert and oriented to person, place, and time.  Psychiatric:        Attention and Perception: She perceives auditory hallucinations.        Mood and Affect: Mood is depressed.        Speech: Speech normal.  Behavior: Behavior is cooperative.        Thought Content: Thought content normal.        Cognition and Memory: Cognition normal.    Review of Systems  Constitutional: Negative.   HENT: Negative.    Eyes: Negative.   Respiratory: Negative.    Cardiovascular: Negative.   Gastrointestinal: Negative.   Genitourinary: Negative.   Musculoskeletal: Negative.   Skin: Negative.   Neurological: Negative.   Psychiatric/Behavioral:  Positive for hallucinations.    Blood pressure 123/74, last menstrual period 06/01/2023, unknown if currently breastfeeding. There is no height or weight on file to calculate BMI.  Treatment Plan Summary: Daily contact with patient to assess and evaluate symptoms and progress in treatment [facility] 07/28/2023 +THC. Patient remains voluntary, agrees with plan for inpatient psychiatric hospitalization.  Will remain in [facility] behavioral health and continuous observation while awaiting inpatient psychiatric treatment.  Patient reviewed with Dr. Docia Freeman.  Current medications: -Acetaminophen  650 mg every 6 as needed/mild pain -Maalox 30 mL oral every 4 as needed/digestion -Hydroxyzine  25 mg 3 times daily as  needed/anxiety -Magnesium  hydroxide 30 mL daily as needed/mild constipation -Trazodone  50 mg nightly as needed/sleep  Agitation protocol initiated: Mild agitation: -Haloperidol  5 mg oral 3 times daily as needed -Diphenhydramine  50 mg oral 3 times daily as needed  Moderate agitation: -Haloperidol  lactate 5 mg IM 3 times daily as needed -Diphenhydramine  injection 50 mg IM 3 times daily as needed -Lorazepam  2 mg IM 3 times daily as needed  Severe agitation: -Haloperidol  lactate 10 mg IM 3 times daily as needed -Diphenhydramine  injection 50 mg IM 3 times daily as needed -Lorazepam  2 mg IM 3 times daily as needed   Tor Freed, FNP 07/28/2023 11:29 AM

## 2023-07-28 NOTE — Discharge Instructions (Signed)
 Pt transferred to [facility]

## 2023-07-28 NOTE — ED Provider Notes (Signed)
 [facility] Continuous Assessment Admission H&P  Date: 07/28/23 Patient Name: Carly Brooks MRN: [id number] Chief Complaint: suicidal ideation   Diagnoses:  Final diagnoses:  MDD (major depressive disorder), recurrent episode, moderate (HCC)    HPI: Carly Brooks is a 28 year old female with a history of schizophrenia, cannabis abuse, and depression who was brought in for a mental health evaluation by mobile crisis. Upon initial contact, she was observed walking in the hallway without any clothes. She was redirected to the assessment room and provided clothing. The patient reports experiencing passive suicidal ideation and auditory hallucinations, which she attributes to "a lot of triggers," but she declines to elaborate or provide further details. Throughout the assessment, she consistently refused to participate, repeatedly stating, "I'm tired of repeating myself," despite this provider explaining the importance of gathering information for her care.  Patient is alert and was initially unclothed but accepted clothing when offered. She is minimally cooperative and withdrawn during the interview. Eye contact is limited, and she displays irritable and frustrated behavior. Speech is sparse, with brief, repetitive statements. Mood is reported as tired; affect is blunted and restricted. She displaced thought blocking; content is notable for passive suicidal ideation and auditory hallucinations. She declines to elaborate on delusional content. Insight and judgment are poor.  Total Time spent with patient: 30 minutes  Musculoskeletal  Strength & Muscle Tone: within normal limits Gait & Station: normal Patient leans: Right  Psychiatric Specialty Exam  Presentation General Appearance:  Casual  Eye Contact: Good  Speech: Blocked (refusing to participate)  Speech Volume: Normal  Handedness: Right   Mood and Affect   Mood: Irritable  Affect: Congruent   Thought Process  Thought Processes: Other (comment) (unable to assess)  Descriptions of Associations:-- (unable to assess)  Orientation:Other (comment) (unable to assess)  Thought Content:Other (comment) (unable to assess)  Diagnosis of Schizophrenia or Schizoaffective disorder in past: Yes  Duration of Psychotic Symptoms: Greater than six months  Hallucinations:Hallucinations: Visual Description of Auditory Hallucinations: pt says she does not want to provide information about AH Description of Visual Hallucinations: pt says she does not want to provide information about VH  Ideas of Reference:None  Suicidal Thoughts:Suicidal Thoughts: Yes, Active SI Active Intent and/or Plan: -- (Refused to disclose plan or intent)  Homicidal Thoughts:Homicidal Thoughts: No   Sensorium  Memory: Other (comment) (Unable to assess)  Judgment: Other (comment) (Unable to assess)  Insight: Other (comment) (Unable to assess)   Executive Functions  Concentration: Poor  Attention Span: Poor  Recall: Other (comment) (Unable to assess)  Fund of Knowledge: -- (unable to assess)  Language: Fair   Psychomotor Activity  Psychomotor Activity: Psychomotor Activity: Normal   Assets  Assets: Physical Health   Sleep  Sleep: Sleep: -- (unable to assess) Number of Hours of Sleep: 0 (unable to assess)   Nutritional Assessment (For OBS and FBC admissions only) Has the patient had a weight loss or gain of 10 pounds or more in the last 3 months?: -- (unable to assess) Has the patient had a decrease in food intake/or appetite?: -- (unable to assess) Does the patient have dental problems?: -- (unable to assess) Does the patient have eating habits or behaviors that may be indicators of an eating disorder including binging or inducing vomiting?: -- (unable to assess) Has the patient recently lost weight without trying?: -- (unable to  assess) Has the patient been eating poorly because of a decreased appetite?: -- (unable to assess)    Physical Exam  Vitals and nursing note reviewed.  Constitutional:      General: She is not in acute distress.    Appearance: She is well-developed.  HENT:     Head: Normocephalic and atraumatic.  Eyes:     Conjunctiva/sclera: Conjunctivae normal.  Cardiovascular:     Rate and Rhythm: Normal rate.  Pulmonary:     Effort: Pulmonary effort is normal.  Musculoskeletal:        General: Normal range of motion.  Neurological:     Mental Status: She is alert and oriented to person, place, and time.  Psychiatric:        Attention and Perception: She perceives auditory hallucinations. She does not perceive visual hallucinations.        Mood and Affect: Mood is depressed.        Speech: Speech normal.        Behavior: Behavior is uncooperative.        Thought Content: Thought content includes suicidal ideation.    Review of Systems  Constitutional: Negative.   HENT: Negative.    Eyes: Negative.   Respiratory: Negative.    Cardiovascular: Negative.   Gastrointestinal: Negative.   Genitourinary: Negative.   Musculoskeletal: Negative.   Skin: Negative.   Neurological: Negative.   Endo/Heme/Allergies: Negative.   Psychiatric/Behavioral:  Positive for depression, hallucinations and suicidal ideas. The patient is nervous/anxious.     Last menstrual period 06/01/2023, unknown if currently breastfeeding. There is no height or weight on file to calculate BMI.  Past Psychiatric History: chizophrenia, cannabis abuse, and depression    Is the patient at risk to self? Yes  Has the patient been a risk to self in the past 6 months? Yes .    Has the patient been a risk to self within the distant past? Yes   Is the patient a risk to others? No   Has the patient been a risk to others in the past 6 months? No   Has the patient been a risk to others within the distant past? No   Past Medical  History:  Past Medical History:  Diagnosis Date   Anxiety    Cluster B personality disorder (HCC)    Depression    Medical history non-contributory      Family History:  Family History  Problem Relation Age of Onset   Diabetes Mother      Social History:  Social History   Tobacco Use   Smoking status: Never   Smokeless tobacco: Never   Tobacco comments:    "I smoke weed everyday"  Vaping Use   Vaping status: Never Used  Substance Use Topics   Alcohol use: Not Currently    Alcohol/week: 2.0 standard drinks of alcohol    Types: 2 Glasses of wine per week   Drug use: Never     Last Labs:  Admission on 07/28/2023  Component Date Value Ref Range Status   WBC 07/28/2023 7.2  4.0 - 10.5 K/uL Final   RBC 07/28/2023 4.63  3.87 - 5.11 MIL/uL Final   Hemoglobin 07/28/2023 12.0  12.0 - 15.0 g/dL Final   HCT 87/56/4332 38.8  36.0 - 46.0 % Final   MCV 07/28/2023 83.8  80.0 - 100.0 fL Final   MCH 07/28/2023 25.9 (L)  26.0 - 34.0 pg Final   MCHC 07/28/2023 30.9  30.0 - 36.0 g/dL Final   RDW 95/18/8416 14.9  11.5 - 15.5 % Final   Platelets 07/28/2023 317  150 - 400 K/uL Final  nRBC 07/28/2023 0.0  0.0 - 0.2 % Final   Neutrophils Relative % 07/28/2023 55  % Final   Neutro Abs 07/28/2023 4.0  1.7 - 7.7 K/uL Final   Lymphocytes Relative 07/28/2023 34  % Final   Lymphs Abs 07/28/2023 2.4  0.7 - 4.0 K/uL Final   Monocytes Relative 07/28/2023 8  % Final   Monocytes Absolute 07/28/2023 0.6  0.1 - 1.0 K/uL Final   Eosinophils Relative 07/28/2023 2  % Final   Eosinophils Absolute 07/28/2023 0.1  0.0 - 0.5 K/uL Final   Basophils Relative 07/28/2023 1  % Final   Basophils Absolute 07/28/2023 0.1  0.0 - 0.1 K/uL Final   Immature Granulocytes 07/28/2023 0  % Final   Abs Immature Granulocytes 07/28/2023 0.01  0.00 - 0.07 K/uL Final   Performed at [facility], [address], [place], [place] [address]   Sodium 07/28/2023 137  135 - 145 mmol/L Final   Potassium 07/28/2023 3.9   3.5 - 5.1 mmol/L Final   Chloride 07/28/2023 106  98 - 111 mmol/L Final   CO2 07/28/2023 19 (L)  22 - 32 mmol/L Final   Glucose, Bld 07/28/2023 105 (H)  70 - 99 mg/dL Final   Glucose reference range applies only to samples taken after fasting for at least 8 hours.   BUN 07/28/2023 8  6 - 20 mg/dL Final   Creatinine, Ser 07/28/2023 0.69  0.44 - 1.00 mg/dL Final   Calcium 60/45/4098 9.6  8.9 - 10.3 mg/dL Final   Total Protein 11/91/4782 7.4  6.5 - 8.1 g/dL Final   Albumin 95/62/1308 3.9  3.5 - 5.0 g/dL Final   AST 65/78/4696 17  15 - 41 U/L Final   ALT 07/28/2023 21  0 - 44 U/L Final   Alkaline Phosphatase 07/28/2023 114  38 - 126 U/L Final   Total Bilirubin 07/28/2023 0.5  0.0 - 1.2 mg/dL Final   GFR, Estimated 07/28/2023 >60  >60 mL/min Final   Comment: (NOTE) Calculated using the CKD-EPI Creatinine Equation (2021)    Anion gap 07/28/2023 12  5 - 15 Final   Performed at [facility], [address], [place], [place] [address]   Hgb A1c MFr Bld 07/28/2023 5.0  4.8 - 5.6 % Final   Comment: (NOTE) Pre diabetes:          5.7%-6.4%  Diabetes:              >6.4%  Glycemic control for   <7.0% adults with diabetes    Mean Plasma Glucose 07/28/2023 96.8  mg/dL Final   Performed at [facility], [address] [place], [place] [address]   Alcohol, Ethyl (B) 07/28/2023 <15  <15 mg/dL Final   Comment: Please note change in reference range. (NOTE) For medical purposes only. Performed at [facility], [address] [place], [place] [address]    Cholesterol 07/28/2023 146  0 - 200 mg/dL Final   Triglycerides 72/53/6644 48  <150 mg/dL Final   HDL 03/47/4259 41  >40 mg/dL Final   Total CHOL/HDL Ratio 07/28/2023 3.6  RATIO Final   VLDL 07/28/2023 10  0 - 40 mg/dL Final   LDL Cholesterol 07/28/2023 95  0 - 99 mg/dL Final   Comment:        Total Cholesterol/HDL:CHD Risk Coronary Heart Disease Risk Table  Men   Women  1/2 Average Risk   3.4   3.3   Average Risk       5.0   4.4  2 X Average Risk   9.6   7.1  3 X Average Risk  23.4   11.0        Use the calculated Patient Ratio above and the CHD Risk Table to determine the patient's CHD Risk.        ATP III CLASSIFICATION (LDL):  <100     mg/dL   Optimal  213-086  mg/dL   Near or Above                    Optimal  130-159  mg/dL   Borderline  578-469  mg/dL   High  >629     mg/dL   Very High Performed at [facility], [address] [place], [place] [address]    TSH 07/28/2023 3.255  0.350 - 4.500 uIU/mL Final   Comment: Performed by a 3rd Generation assay with a functional sensitivity of <=0.01 uIU/mL. Performed at [facility], [address], [place], [place] [address]    POC Amphetamine UR 07/28/2023 None Detected  NONE DETECTED (Cut Off Level 1000 ng/mL) Final   POC Secobarbital (BAR) 07/28/2023 None Detected  NONE DETECTED (Cut Off Level 300 ng/mL) Final   POC Buprenorphine (BUP) 07/28/2023 None Detected  NONE DETECTED (Cut Off Level 10 ng/mL) Final   POC Oxazepam (BZO) 07/28/2023 None Detected  NONE DETECTED (Cut Off Level 300 ng/mL) Final   POC Cocaine UR 07/28/2023 None Detected  NONE DETECTED (Cut Off Level 300 ng/mL) Final   POC Methamphetamine UR 07/28/2023 None Detected  NONE DETECTED (Cut Off Level 1000 ng/mL) Final   POC Morphine  07/28/2023 None Detected  NONE DETECTED (Cut Off Level 300 ng/mL) Final   POC Methadone UR 07/28/2023 None Detected  NONE DETECTED (Cut Off Level 300 ng/mL) Final   POC Oxycodone  UR 07/28/2023 None Detected  NONE DETECTED (Cut Off Level 100 ng/mL) Final   POC Marijuana UR 07/28/2023 Positive (A)  NONE DETECTED (Cut Off Level 50 ng/mL) Final   Preg Test, Ur 07/28/2023 NEGATIVE  NEGATIVE Final   Comment:        THE SENSITIVITY OF THIS METHODOLOGY IS >24 mIU/mL    Preg Test, Ur 07/28/2023 NEGATIVE  NEGATIVE Final   Comment:        THE SENSITIVITY OF THIS METHODOLOGY IS >24 mIU/mL   Admission on 07/12/2023, Discharged on  07/13/2023  Component Date Value Ref Range Status   WBC 07/12/2023 8.6  4.0 - 10.5 K/uL Final   RBC 07/12/2023 4.86  3.87 - 5.11 MIL/uL Final   Hemoglobin 07/12/2023 12.6  12.0 - 15.0 g/dL Final   HCT 01/14/2535 40.5  36.0 - 46.0 % Final   MCV 07/12/2023 83.3  80.0 - 100.0 fL Final   MCH 07/12/2023 25.9 (L)  26.0 - 34.0 pg Final   MCHC 07/12/2023 31.1  30.0 - 36.0 g/dL Final   RDW 64/40/3474 14.8  11.5 - 15.5 % Final   Platelets 07/12/2023 335  150 - 400 K/uL Final   nRBC 07/12/2023 0.0  0.0 - 0.2 % Final   Neutrophils Relative % 07/12/2023 67  % Final   Neutro Abs 07/12/2023 5.8  1.7 - 7.7 K/uL Final   Lymphocytes Relative 07/12/2023 23  % Final   Lymphs Abs 07/12/2023 2.0  0.7 - 4.0 K/uL Final  Monocytes Relative 07/12/2023 8  % Final   Monocytes Absolute 07/12/2023 0.6  0.1 - 1.0 K/uL Final   Eosinophils Relative 07/12/2023 1  % Final   Eosinophils Absolute 07/12/2023 0.0  0.0 - 0.5 K/uL Final   Basophils Relative 07/12/2023 1  % Final   Basophils Absolute 07/12/2023 0.1  0.0 - 0.1 K/uL Final   Immature Granulocytes 07/12/2023 0  % Final   Abs Immature Granulocytes 07/12/2023 0.01  0.00 - 0.07 K/uL Final   Performed at [facility], [address] [place], [place] [address]   Sodium 07/12/2023 140  135 - 145 mmol/L Final   Potassium 07/12/2023 4.0  3.5 - 5.1 mmol/L Final   Chloride 07/12/2023 106  98 - 111 mmol/L Final   CO2 07/12/2023 23  22 - 32 mmol/L Final   Glucose, Bld 07/12/2023 104 (H)  70 - 99 mg/dL Final   Glucose reference range applies only to samples taken after fasting for at least 8 hours.   BUN 07/12/2023 6  6 - 20 mg/dL Final   Creatinine, Ser 07/12/2023 0.79  0.44 - 1.00 mg/dL Final   Calcium 78/29/5621 9.9  8.9 - 10.3 mg/dL Final   Total Protein 30/86/5784 7.4  6.5 - 8.1 g/dL Final   Albumin 69/62/9528 3.9  3.5 - 5.0 g/dL Final   AST 41/32/4401 20  15 - 41 U/L Final   ALT 07/12/2023 23  0 - 44 U/L Final   Alkaline Phosphatase 07/12/2023 94  38 - 126  U/L Final   Total Bilirubin 07/12/2023 0.4  0.0 - 1.2 mg/dL Final   GFR, Estimated 07/12/2023 >60  >60 mL/min Final   Comment: (NOTE) Calculated using the CKD-EPI Creatinine Equation (2021)    Anion gap 07/12/2023 11  5 - 15 Final   Performed at [facility], [address] [place], [place] [address]   Magnesium  07/12/2023 2.0  1.7 - 2.4 mg/dL Final   Performed at [facility], [address], [place], [place] [address]   Alcohol, Ethyl (B) 07/12/2023 <15  <15 mg/dL Final   Comment: Please note change in reference range. (NOTE) For medical purposes only. Performed at [facility], [address], [place], [place] [address]    TSH 07/12/2023 2.632  0.350 - 4.500 uIU/mL Final   Comment: Performed by a 3rd Generation assay with a functional sensitivity of <=0.01 uIU/mL. Performed at [facility], [address], [place], [place] [address]    Prolactin 07/12/2023 14.2  4.8 - 33.4 ng/mL Final   Comment: (NOTE) Performed At: [facility] [address] [place], [place] [id number] Pearlean Botts MD RJ:[id number]    POC Amphetamine UR 07/12/2023 None Detected  NONE DETECTED (Cut Off Level 1000 ng/mL) Final   POC Secobarbital (BAR) 07/12/2023 None Detected  NONE DETECTED (Cut Off Level 300 ng/mL) Final   POC Buprenorphine (BUP) 07/12/2023 None Detected  NONE DETECTED (Cut Off Level 10 ng/mL) Final   POC Oxazepam (BZO) 07/12/2023 Positive (A)  NONE DETECTED (Cut Off Level 300 ng/mL) Final   POC Cocaine UR 07/12/2023 None Detected  NONE DETECTED (Cut Off Level 300 ng/mL) Final   POC Methamphetamine UR 07/12/2023 None Detected  NONE DETECTED (Cut Off Level 1000 ng/mL) Final   POC Morphine  07/12/2023 None Detected  NONE DETECTED (Cut Off Level 300 ng/mL) Final   POC Methadone UR 07/12/2023 None Detected  NONE DETECTED (Cut Off Level 300 ng/mL) Final   POC Oxycodone   UR 07/12/2023 None Detected  NONE DETECTED (Cut Off Level 100 ng/mL) Final   POC Marijuana UR  07/12/2023 Positive (A)  NONE DETECTED (Cut Off Level 50 ng/mL) Final   Glucose-Capillary 07/12/2023 135 (H)  70 - 99 mg/dL Final   Glucose reference range applies only to samples taken after fasting for at least 8 hours.  Admission on 07/01/2023, Discharged on 07/12/2023  Component Date Value Ref Range Status   WBC 07/03/2023 5.8  4.0 - 10.5 K/uL Final   RBC 07/03/2023 4.93  3.87 - 5.11 MIL/uL Final   Hemoglobin 07/03/2023 12.8  12.0 - 15.0 g/dL Final   HCT 16/12/9602 41.7  36.0 - 46.0 % Final   MCV 07/03/2023 84.6  80.0 - 100.0 fL Final   MCH 07/03/2023 26.0  26.0 - 34.0 pg Final   MCHC 07/03/2023 30.7  30.0 - 36.0 g/dL Final   RDW 54/11/8117 15.4  11.5 - 15.5 % Final   Platelets 07/03/2023 363  150 - 400 K/uL Final   nRBC 07/03/2023 0.0  0.0 - 0.2 % Final   Neutrophils Relative % 07/03/2023 54  % Final   Neutro Abs 07/03/2023 3.1  1.7 - 7.7 K/uL Final   Lymphocytes Relative 07/03/2023 34  % Final   Lymphs Abs 07/03/2023 2.0  0.7 - 4.0 K/uL Final   Monocytes Relative 07/03/2023 9  % Final   Monocytes Absolute 07/03/2023 0.5  0.1 - 1.0 K/uL Final   Eosinophils Relative 07/03/2023 2  % Final   Eosinophils Absolute 07/03/2023 0.1  0.0 - 0.5 K/uL Final   Basophils Relative 07/03/2023 1  % Final   Basophils Absolute 07/03/2023 0.1  0.0 - 0.1 K/uL Final   Immature Granulocytes 07/03/2023 0  % Final   Abs Immature Granulocytes 07/03/2023 0.01  0.00 - 0.07 K/uL Final   Performed at [facility], [address], [place], [place] [address]   Sodium 07/03/2023 137  135 - 145 mmol/L Final   Potassium 07/03/2023 3.6  3.5 - 5.1 mmol/L Final   Chloride 07/03/2023 105  98 - 111 mmol/L Final   CO2 07/03/2023 21 (L)  22 - 32 mmol/L Final   Glucose, Bld 07/03/2023 97  70 - 99 mg/dL Final   Glucose reference range applies only to samples taken after fasting for at least 8 hours.   BUN 07/03/2023 6  6 - 20 mg/dL Final   Creatinine, Ser 07/03/2023 0.75  0.44 - 1.00 mg/dL Final    Calcium 95/62/1308 9.5  8.9 - 10.3 mg/dL Final   Total Protein 65/78/4696 7.9  6.5 - 8.1 g/dL Final   Albumin 29/52/8413 4.0  3.5 - 5.0 g/dL Final   AST 24/40/1027 50 (H)  15 - 41 U/L Final   ALT 07/03/2023 86 (H)  0 - 44 U/L Final   Alkaline Phosphatase 07/03/2023 114  38 - 126 U/L Final   Total Bilirubin 07/03/2023 0.8  0.0 - 1.2 mg/dL Final   GFR, Estimated 07/03/2023 >60  >60 mL/min Final   Comment: (NOTE) Calculated using the CKD-EPI Creatinine Equation (2021)    Anion gap 07/03/2023 11  5 - 15 Final   Performed at [facility], [address] [place] [address], [place], [place] [address]   Vitamin B-12 07/03/2023 409  180 - 914 pg/mL Final   Comment: (NOTE) This assay is not validated for testing neonatal or myeloproliferative syndrome specimens for Vitamin B12 levels. Performed at [facility], [address] [place], [place] [address]  Folate 07/03/2023 14.1  >5.9 ng/mL Final   Performed at [facility], [address] [place], [place] [address]  Admission on 06/30/2023, Discharged on 07/01/2023  Component Date Value Ref Range Status   Sodium 06/30/2023 136  135 - 145 mmol/L Final   Potassium 06/30/2023 3.8  3.5 - 5.1 mmol/L Final   Chloride 06/30/2023 106  98 - 111 mmol/L Final   CO2 06/30/2023 24  22 - 32 mmol/L Final   Glucose, Bld 06/30/2023 109 (H)  70 - 99 mg/dL Final   Glucose reference range applies only to samples taken after fasting for at least 8 hours.   BUN 06/30/2023 8  6 - 20 mg/dL Final   Creatinine, Ser 06/30/2023 0.67  0.44 - 1.00 mg/dL Final   Calcium 11/91/4782 9.0  8.9 - 10.3 mg/dL Final   Total Protein 95/62/1308 7.8  6.5 - 8.1 g/dL Final   Albumin 65/78/4696 3.8  3.5 - 5.0 g/dL Final   AST 29/52/8413 95 (H)  15 - 41 U/L Final   ALT 06/30/2023 96 (H)  0 - 44 U/L Final   Alkaline Phosphatase 06/30/2023 115  38 - 126 U/L Final   Total Bilirubin 06/30/2023 0.3  0.0 - 1.2 mg/dL Final   GFR, Estimated  06/30/2023 >60  >60 mL/min Final   Comment: (NOTE) Calculated using the CKD-EPI Creatinine Equation (2021)    Anion gap 06/30/2023 6  5 - 15 Final   Performed at [facility], [address] [place], [place] [address]   Alcohol, Ethyl (B) 06/30/2023 <10  <10 mg/dL Final   Comment: (NOTE) Lowest detectable limit for serum alcohol is 10 mg/dL.  For medical purposes only. Performed at [facility], [address], [place], [place] [address]    Opiates 06/30/2023 NONE DETECTED  NONE DETECTED Final   Cocaine 06/30/2023 NONE DETECTED  NONE DETECTED Final   Benzodiazepines 06/30/2023 NONE DETECTED  NONE DETECTED Final   Amphetamines 06/30/2023 NONE DETECTED  NONE DETECTED Final   Tetrahydrocannabinol 06/30/2023 POSITIVE (A)  NONE DETECTED Final   Barbiturates 06/30/2023 NONE DETECTED  NONE DETECTED Final   Comment: (NOTE) DRUG SCREEN FOR MEDICAL PURPOSES ONLY.  IF CONFIRMATION IS NEEDED FOR ANY PURPOSE, NOTIFY LAB WITHIN 5 DAYS.  LOWEST DETECTABLE LIMITS FOR URINE DRUG SCREEN Drug Class                     Cutoff (ng/mL) Amphetamine and metabolites    1000 Barbiturate and metabolites    200 Benzodiazepine                 200 Opiates and metabolites        300 Cocaine and metabolites        300 THC                            50 Performed at [facility], [address] [place], [place] [address]    WBC 06/30/2023 6.6  4.0 - 10.5 K/uL Final   RBC 06/30/2023 4.54  3.87 - 5.11 MIL/uL Final   Hemoglobin 06/30/2023 11.9 (L)  12.0 - 15.0 g/dL Final   HCT 03/47/4259 38.5  36.0 - 46.0 % Final   MCV 06/30/2023 84.8  80.0 - 100.0 fL Final   MCH 06/30/2023 26.2  26.0 - 34.0 pg Final   MCHC 06/30/2023 30.9  30.0 - 36.0 g/dL Final   RDW  06/30/2023 15.4  11.5 - 15.5 % Final   Platelets 06/30/2023 338  150 - 400 K/uL Final   nRBC 06/30/2023 0.0  0.0 - 0.2 % Final   Neutrophils Relative % 06/30/2023 60  % Final   Neutro Abs  06/30/2023 4.0  1.7 - 7.7 K/uL Final   Lymphocytes Relative 06/30/2023 29  % Final   Lymphs Abs 06/30/2023 1.9  0.7 - 4.0 K/uL Final   Monocytes Relative 06/30/2023 8  % Final   Monocytes Absolute 06/30/2023 0.5  0.1 - 1.0 K/uL Final   Eosinophils Relative 06/30/2023 2  % Final   Eosinophils Absolute 06/30/2023 0.1  0.0 - 0.5 K/uL Final   Basophils Relative 06/30/2023 1  % Final   Basophils Absolute 06/30/2023 0.1  0.0 - 0.1 K/uL Final   Immature Granulocytes 06/30/2023 0  % Final   Abs Immature Granulocytes 06/30/2023 0.02  0.00 - 0.07 K/uL Final   Performed at [facility] [address] [place] [address] [place], [place] [address]   Preg, Serum 06/30/2023 NEGATIVE  NEGATIVE Final   Comment:        THE SENSITIVITY OF THIS METHODOLOGY IS >10 mIU/mL. Performed at [facility] [place], [address], [place], [place] [address]   Admission on 06/09/2023, Discharged on 06/10/2023  Component Date Value Ref Range Status   WBC 06/09/2023 7.3  4.0 - 10.5 K/uL Final   RBC 06/09/2023 4.97  3.87 - 5.11 MIL/uL Final   Hemoglobin 06/09/2023 12.9  12.0 - 15.0 g/dL Final   HCT 29/56/2130 41.5  36.0 - 46.0 % Final   MCV 06/09/2023 83.5  80.0 - 100.0 fL Final   MCH 06/09/2023 26.0  26.0 - 34.0 pg Final   MCHC 06/09/2023 31.1  30.0 - 36.0 g/dL Final   RDW 86/57/8469 15.4  11.5 - 15.5 % Final   Platelets 06/09/2023 337  150 - 400 K/uL Final   nRBC 06/09/2023 0.0  0.0 - 0.2 % Final   Neutrophils Relative % 06/09/2023 65  % Final   Neutro Abs 06/09/2023 4.8  1.7 - 7.7 K/uL Final   Lymphocytes Relative 06/09/2023 25  % Final   Lymphs Abs 06/09/2023 1.8  0.7 - 4.0 K/uL Final   Monocytes Relative 06/09/2023 8  % Final   Monocytes Absolute 06/09/2023 0.6  0.1 - 1.0 K/uL Final   Eosinophils Relative 06/09/2023 1  % Final   Eosinophils Absolute 06/09/2023 0.0  0.0 - 0.5 K/uL Final   Basophils Relative 06/09/2023 1  % Final   Basophils Absolute 06/09/2023 0.1  0.0 - 0.1 K/uL Final    Immature Granulocytes 06/09/2023 0  % Final   Abs Immature Granulocytes 06/09/2023 0.01  0.00 - 0.07 K/uL Final   Performed at [facility], [address], [place], [place] [address]   Sodium 06/09/2023 136  135 - 145 mmol/L Final   Potassium 06/09/2023 4.1  3.5 - 5.1 mmol/L Final   Chloride 06/09/2023 105  98 - 111 mmol/L Final   CO2 06/09/2023 22  22 - 32 mmol/L Final   Glucose, Bld 06/09/2023 89  70 - 99 mg/dL Final   Glucose reference range applies only to samples taken after fasting for at least 8 hours.   BUN 06/09/2023 6  6 - 20 mg/dL Final   Creatinine, Ser 06/09/2023 0.66  0.44 - 1.00 mg/dL Final   Calcium 84/13/2440 9.5  8.9 - 10.3 mg/dL Final   Total Protein 01/14/2535 7.4  6.5 - 8.1  g/dL Final   Albumin 16/12/9602 4.1  3.5 - 5.0 g/dL Final   AST 54/11/8117 22  15 - 41 U/L Final   ALT 06/09/2023 17  0 - 44 U/L Final   Alkaline Phosphatase 06/09/2023 97  38 - 126 U/L Final   Total Bilirubin 06/09/2023 0.9  0.0 - 1.2 mg/dL Final   GFR, Estimated 06/09/2023 >60  >60 mL/min Final   Comment: (NOTE) Calculated using the CKD-EPI Creatinine Equation (2021)    Anion gap 06/09/2023 9  5 - 15 Final   Performed at [facility], [address] [place], [place] [address]   Hgb A1c MFr Bld 06/09/2023 4.9  4.8 - 5.6 % Final   Comment: (NOTE) Pre diabetes:          5.7%-6.4%  Diabetes:              >6.4%  Glycemic control for   <7.0% adults with diabetes    Mean Plasma Glucose 06/09/2023 93.93  mg/dL Final   Performed at [facility], [address], [place], [place] [address]   Alcohol, Ethyl (B) 06/09/2023 <10  <10 mg/dL Final   Comment: (NOTE) Lowest detectable limit for serum alcohol is 10 mg/dL.  For medical purposes only. Performed at [facility], [address], [place], [place] [address]    Cholesterol 06/09/2023 154  0 - 200 mg/dL Final   Triglycerides 78/46/9629 51  <150 mg/dL Final   HDL 52/84/1324 43  >40 mg/dL Final   Total CHOL/HDL  Ratio 06/09/2023 3.6  RATIO Final   VLDL 06/09/2023 10  0 - 40 mg/dL Final   LDL Cholesterol 06/09/2023 101 (H)  0 - 99 mg/dL Final   Comment:        Total Cholesterol/HDL:CHD Risk Coronary Heart Disease Risk Table                     Men   Women  1/2 Average Risk   3.4   3.3  Average Risk       5.0   4.4  2 X Average Risk   9.6   7.1  3 X Average Risk  23.4   11.0        Use the calculated Patient Ratio above and the CHD Risk Table to determine the patient's CHD Risk.        ATP III CLASSIFICATION (LDL):  <100     mg/dL   Optimal  401-027  mg/dL   Near or Above                    Optimal  130-159  mg/dL   Borderline  253-664  mg/dL   High  >403     mg/dL   Very High Performed at [facility], [address] [place], [place] [address]    RPR Ser Ql 06/09/2023 NON REACTIVE  NON REACTIVE Final   Performed at [facility], [address] [place], [place] [address]   Color, Urine 06/09/2023 YELLOW  YELLOW Final   APPearance 06/09/2023 CLEAR  CLEAR Final   Specific Gravity, Urine 06/09/2023 <1.005 (L)  1.005 - 1.030 Final   pH 06/09/2023 6.5  5.0 - 8.0 Final   Glucose, UA 06/09/2023 NEGATIVE  NEGATIVE mg/dL Final   Hgb urine dipstick 06/09/2023 LARGE (A)  NEGATIVE Final   Bilirubin Urine 06/09/2023 NEGATIVE  NEGATIVE Final   Ketones, ur 06/09/2023 NEGATIVE  NEGATIVE  mg/dL Final   Protein, ur 78/46/9629 NEGATIVE  NEGATIVE mg/dL Final   Nitrite 52/84/1324 NEGATIVE  NEGATIVE Final   Leukocytes,Ua 06/09/2023 NEGATIVE  NEGATIVE Final   Performed at [facility], [address], [place], [place] [address]   Preg Test, Ur 06/09/2023 Negative  Negative Final   POC Amphetamine UR 06/09/2023 None Detected  NONE DETECTED (Cut Off Level 1000 ng/mL) Final   POC Secobarbital (BAR) 06/09/2023 None Detected  NONE DETECTED (Cut Off Level 300 ng/mL) Final   POC Buprenorphine (BUP) 06/09/2023 None Detected  NONE DETECTED (Cut Off Level 10 ng/mL) Final   POC Oxazepam (BZO)  06/09/2023 None Detected  NONE DETECTED (Cut Off Level 300 ng/mL) Final   POC Cocaine UR 06/09/2023 None Detected  NONE DETECTED (Cut Off Level 300 ng/mL) Final   POC Methamphetamine UR 06/09/2023 None Detected  NONE DETECTED (Cut Off Level 1000 ng/mL) Final   POC Morphine  06/09/2023 None Detected  NONE DETECTED (Cut Off Level 300 ng/mL) Final   POC Methadone UR 06/09/2023 None Detected  NONE DETECTED (Cut Off Level 300 ng/mL) Final   POC Oxycodone  UR 06/09/2023 None Detected  NONE DETECTED (Cut Off Level 100 ng/mL) Final   POC Marijuana UR 06/09/2023 Positive (A)  NONE DETECTED (Cut Off Level 50 ng/mL) Final   HIV Screen 4th Generation wRfx 06/09/2023 Non Reactive  Non Reactive Final   Performed at [facility], [address], [place], [place] [address]   TSH 06/09/2023 2.309  0.350 - 4.500 uIU/mL Final   Comment: Performed by a 3rd Generation assay with a functional sensitivity of <=0.01 uIU/mL. Performed at [facility], [address], [place], [place] [address]    RBC / HPF 06/09/2023 0-5  0 - 5 RBC/hpf Final   WBC, UA 06/09/2023 NONE SEEN  0 - 5 WBC/hpf Final   Bacteria, UA 06/09/2023 RARE (A)  NONE SEEN Final   Squamous Epithelial / HPF 06/09/2023 0-5  0 - 5 /HPF Final   Amorphous Crystal 06/09/2023 PRESENT   Final   Performed at [facility], [address], [place], [place] [address]  Admission on 03/29/2023, Discharged on 03/29/2023  Component Date Value Ref Range Status   WBC 03/29/2023 5.4  4.0 - 10.5 K/uL Final   RBC 03/29/2023 4.79  3.87 - 5.11 MIL/uL Final   Hemoglobin 03/29/2023 12.5  12.0 - 15.0 g/dL Final   HCT 95/63/8756 39.9  36.0 - 46.0 % Final   MCV 03/29/2023 83.3  80.0 - 100.0 fL Final   MCH 03/29/2023 26.1  26.0 - 34.0 pg Final   MCHC 03/29/2023 31.3  30.0 - 36.0 g/dL Final   RDW 43/32/9518 14.3  11.5 - 15.5 % Final   Platelets 03/29/2023 266  150 - 400 K/uL Final   nRBC 03/29/2023 0.0  0.0 - 0.2 % Final   Neutrophils Relative % 03/29/2023 50   % Final   Neutro Abs 03/29/2023 2.7  1.7 - 7.7 K/uL Final   Lymphocytes Relative 03/29/2023 39  % Final   Lymphs Abs 03/29/2023 2.1  0.7 - 4.0 K/uL Final   Monocytes Relative 03/29/2023 10  % Final   Monocytes Absolute 03/29/2023 0.5  0.1 - 1.0 K/uL Final   Eosinophils Relative 03/29/2023 1  % Final   Eosinophils Absolute 03/29/2023 0.0  0.0 - 0.5 K/uL Final   Basophils Relative 03/29/2023 0  % Final   Basophils Absolute 03/29/2023 0.0  0.0 - 0.1 K/uL Final  Immature Granulocytes 03/29/2023 0  % Final   Abs Immature Granulocytes 03/29/2023 0.01  0.00 - 0.07 K/uL Final   Performed at [facility], [address] [place], [place] [address]   Sodium 03/29/2023 136  135 - 145 mmol/L Final   Potassium 03/29/2023 3.6  3.5 - 5.1 mmol/L Final   Chloride 03/29/2023 105  98 - 111 mmol/L Final   CO2 03/29/2023 22  22 - 32 mmol/L Final   Glucose, Bld 03/29/2023 101 (H)  70 - 99 mg/dL Final   Glucose reference range applies only to samples taken after fasting for at least 8 hours.   BUN 03/29/2023 5 (L)  6 - 20 mg/dL Final   Creatinine, Ser 03/29/2023 0.76  0.44 - 1.00 mg/dL Final   Calcium 19/14/7829 9.1  8.9 - 10.3 mg/dL Final   Total Protein 56/21/3086 7.0  6.5 - 8.1 g/dL Final   Albumin 57/84/6962 3.6  3.5 - 5.0 g/dL Final   AST 95/28/4132 32  15 - 41 U/L Final   ALT 03/29/2023 34  0 - 44 U/L Final   Alkaline Phosphatase 03/29/2023 114  38 - 126 U/L Final   Total Bilirubin 03/29/2023 0.4  0.0 - 1.2 mg/dL Final   GFR, Estimated 03/29/2023 >60  >60 mL/min Final   Comment: (NOTE) Calculated using the CKD-EPI Creatinine Equation (2021)    Anion gap 03/29/2023 9  5 - 15 Final   Performed at [facility], [address] [place], [place] [address]   Lipase 03/29/2023 43  11 - 51 U/L Final   Performed at [facility], [address], [place], [place] [address]   Color, Urine 03/29/2023 YELLOW  YELLOW Final   APPearance 03/29/2023 CLEAR  CLEAR Final   Specific Gravity, Urine  03/29/2023 1.014  1.005 - 1.030 Final   pH 03/29/2023 7.0  5.0 - 8.0 Final   Glucose, UA 03/29/2023 NEGATIVE  NEGATIVE mg/dL Final   Hgb urine dipstick 03/29/2023 NEGATIVE  NEGATIVE Final   Bilirubin Urine 03/29/2023 NEGATIVE  NEGATIVE Final   Ketones, ur 03/29/2023 NEGATIVE  NEGATIVE mg/dL Final   Protein, ur 66/44/0347 NEGATIVE  NEGATIVE mg/dL Final   Nitrite 42/59/5638 NEGATIVE  NEGATIVE Final   Leukocytes,Ua 03/29/2023 NEGATIVE  NEGATIVE Final   Performed at [facility], [address], [place], [place] [address]   Preg Test, Ur 03/29/2023 NEGATIVE  NEGATIVE Final   Comment:        THE SENSITIVITY OF THIS METHODOLOGY IS >25 mIU/mL. Performed at [facility], [address] [place], [place] [address]   Office Visit on 03/07/2023  Component Date Value Ref Range Status   Hemoglobin A1C 03/07/2023 5.1  4.0 - 5.6 % Final   Glucose-Capillary 03/07/2023 108 (H)  70 - 99 mg/dL Final   Glucose reference range applies only to samples taken after fasting for at least 8 hours.    Allergies: Patient has no known allergies.  Medications:  Facility Ordered Medications  Medication   acetaminophen  (TYLENOL ) tablet 650 mg   alum & mag hydroxide-simeth (MAALOX/MYLANTA) 200-200-20 MG/5ML suspension 30 mL   magnesium  hydroxide (MILK OF MAGNESIA) suspension 30 mL   hydrOXYzine  (ATARAX ) tablet 25 mg   traZODone  (DESYREL ) tablet 50 mg   PTA Medications  Medication Sig   hydrOXYzine  (ATARAX ) 25 MG tablet Take 1 tablet (25 mg total) by mouth 3 (three) times daily as needed for anxiety.   [START ON 08/07/2023] paliperidone  (INVEGA  SUSTENNA)  156 MG/ML SUSY injection Inject 1 mL (156 mg total) into the muscle every 28 (twenty-eight) days. Patient last received on 07/10/23 and is due for next LAI maintenance dose of 156 mg on 08/07/23   polyethylene glycol (MIRALAX  / GLYCOLAX ) 17 g packet Take 17 g by mouth daily.   traZODone  (DESYREL ) 150 MG tablet Take 1 tablet (150 mg total) by mouth at  bedtime as needed for sleep.   FLUoxetine  (PROZAC ) 20 MG capsule Take 1 capsule (20 mg total) by mouth daily.      Medical Decision Making  Patient will be admitted to the obs unit at [id number] for continous assessment with follow-up by psychiatry.     Recommendations  Based on my evaluation the patient does not appear to have an emergency medical condition.  Doreatha Gamer, NP 07/28/23  7:31 AM

## 2023-07-28 NOTE — Progress Notes (Signed)
   07/28/23 0131  [facility] Triage Screening (Walk-ins at [facility] only)  How Did You Hear About Us ? Other (Comment)  What Is the Reason for Your Visit/Call Today? Patient was brought in by mobile crisis for bizarre behavior.Patient observed stripping her clothing and walking around naked in the front lobby by staff. Patient eventually put her clothing back on, then as she was being taken to an assessment room she took off her clothing in the hallway again, and refused to put her clothing back on because she had to temporarily remove her hijab. Patient was provided with scrubs to put on to cover herself and escorted into the assessment room. Patient did not want to talk during the assessment and told the clinician to "use the information from the last assessment and let me know when I can go to my room". She did briefly answer safety questions. She denies HI. She reports passive SI with no plan or intent. She reports auditory hallucinations hearing voices but did not want to elaborate on what she hears. Unable to assess further due to presentation and uncooperation.  How Long Has This Been Causing You Problems? 1 wk - 1 month  Have You Recently Had Any Thoughts About Hurting Yourself? Yes  How long ago did you have thoughts about hurting yourself? passive denied plans or intent  Are You Planning to Commit Suicide/Harm Yourself At This time? No  Have you Recently Had Thoughts About Hurting Someone Marigene Shoulder? No  Are You Planning To Harm Someone At This Time? No  Explanation: denies HI  Physical Abuse  (UTA)  Verbal Abuse  (UTA)  Sexual Abuse  (UTA)  Exploitation of patient/patient's resources  (UTA)  Self-Neglect  (UTA)  Possible abuse reported to:  (UTA)  Are you currently experiencing any auditory, visual or other hallucinations? Yes  Please explain the hallucinations you are currently experiencing: REPORTS HEARING VOICES DID NOT WANT TO ELABORATE  Have You Used Any Alcohol or Drugs in the Past 24 Hours?   (UTA)  What Did You Use and How Much? UTA  Do you have any current medical co-morbidities that require immediate attention?  (UTA)  Clinician description of patient physical appearance/behavior: taking off clothing  What Do You Feel Would Help You the Most Today? Treatment for Depression or other mood problem  If access to [facility] was not available, would you have sought care in the Emergency Department? Yes  Determination of Need Urgent (48 hours)  Options For Referral Inpatient Hospitalization  Determination of Need filed? Yes

## 2023-07-28 NOTE — ED Notes (Signed)
 Patient resting quietly in bed with eyes closed, Respirations equal and unlabored, Vital signs WDL. skin warm and dry, no apparent distress noted. Routine safety checks conducted according to facility protocol. Will continue to monitor for safety.

## 2023-07-29 DIAGNOSIS — F331 Major depressive disorder, recurrent, moderate: Secondary | ICD-10-CM | POA: Diagnosis not present

## 2023-07-29 NOTE — ED Notes (Signed)
 Patient alert and oriented to self and location. Patient's eye contact is minimal and affect is flat. Patient denies A/V/H, S/I, H/I, anxiety, and pain at this time. Patient given snacks/fluids by MHT. Continuing to monitor.

## 2023-07-29 NOTE — ED Notes (Signed)
 Pt observed lying in bed, eyes closed respirations even and none labored. NAD

## 2023-07-29 NOTE — ED Notes (Signed)
 Pt awake, sitting on recliner in flex.   Calm upon approach.  Pt denied SI/Hi at present.  Pt endorsed auditory hallucinations.  Denied command hallucinations ay present/ Pt observed  speaking loudly,  responding to internal stimuli. Pt is redirectable Hourly round for safety continue

## 2023-07-29 NOTE — ED Provider Notes (Signed)
 Behavioral Health Progress Note  Date and Time: 07/29/2023 12:48 PM Name: Montinique Czarny MRN:  [id number]  Subjective:  [name] 28 y.o., female patient admitted to [facility] after presenting from the community and brought in by [facility], where she initially presented voluntarily after disrobing in the community. Patient stated on admission, "I cannot remember what happened before I got here I remember bein gin a cold room, I think being in the hospital helps me, they say I have schizophrenia but I do not know for sure."    Patient seen face to face by this provider, consulted with Dr. Docia Freeman; and chart reviewed on 07/29/23.    During evaluation Alantis Seifert is laying down in the recliner with no noted distress. She is alert/oriented x 3 (person, place, time), calm, cooperative, and inattentive. Her responses were appropriate to assessment questions. Her mood is preoccupied with congruent affect. She spoke in a clear tone at moderate volume, and normal pace, with minimal eye contact.   She denies suicidal/self-harm/homicidal ideation but she does endorse auditory hallucinations. She denies any paranoia at this time. Objectively she appears to be responding to internal stimuli at times. She conversed coherently and answered questions appropriately.   Candas is limited in the amount she shares with provider. She endorses auditory hallucinations as stated above, but she did not share what she was hearing.  Stay Summary: Kameelah has been accepted to [facility], tomorrow, 5/12. She will be transported by safe transport. [facility] has been notified regarding admission and she is agreeable to go to [facility], she states, "I think the hospital helps me."   Diagnosis:  Final diagnoses:  MDD (major depressive disorder), recurrent episode, moderate (HCC)    Total Time spent with patient: 20 minutes  Past Psychiatric History: Schizophrenia, bipolar disorder,  cannibis-induced psychotic disorder; per chart review, she was hospitalized at [facility] 07/13/23, and she was previously at [facility] 07/01/23-07/12/23. She received her last injection of Invega  Sustenna on 07/10/23 while at [facility].  Past Medical History: See H & P Family History: See H & P Family Psychiatric  History: See H & P Social History: She currently resides in [place] with her cousin and two young children. She denies any access to weapons. She denies any alcohol and substance use, but her UDS was positive for marijuana.   Additional Social History:    Pain Medications: SEE MAR Prescriptions: SEE MAR Over the Counter: SEE MAR History of alcohol / drug use?: Yes Longest period of sobriety (when/how long): varies Negative Consequences of Use:  (Patient denies) Withdrawal Symptoms: None Name of Substance 1: UTA 1 - Age of First Use: UTA 1 - Amount (size/oz): UTA 1 - Frequency: UTA 1 - Duration: UTA 1 - Last Use / Amount: UTA 1 - Method of Aquiring: UTA 1- Route of Use: UTA Name of Substance 2: UTA 2 - Age of First Use: uta 2 - Amount (size/oz): uta 2 - Frequency: uta 2 - Duration: uta 2 - Last Use / Amount: uta 2 - Method of Aquiring: UTA 2 - Route of Substance Use: UTA                Sleep: Good  Appetite:  Good  Current Medications:  Current Facility-Administered Medications  Medication Dose Route Frequency Provider Last Rate Last Admin   acetaminophen  (TYLENOL ) tablet 650 mg  650 mg Oral Q6H PRN Ajibola, Ene A, NP       alum &  mag hydroxide-simeth (MAALOX/MYLANTA) 200-200-20 MG/5ML suspension 30 mL  30 mL Oral Q4H PRN Ajibola, Ene A, NP       haloperidol  (HALDOL ) tablet 5 mg  5 mg Oral TID PRN Tor Freed, FNP       And   diphenhydrAMINE  (BENADRYL ) capsule 50 mg  50 mg Oral TID PRN Tor Freed, FNP       haloperidol  lactate (HALDOL ) injection 5 mg  5 mg Intramuscular TID PRN Tor Freed, FNP       And    diphenhydrAMINE  (BENADRYL ) injection 50 mg  50 mg Intramuscular TID PRN Tor Freed, FNP       And   LORazepam  (ATIVAN ) injection 2 mg  2 mg Intramuscular TID PRN Tor Freed, FNP       haloperidol  lactate (HALDOL ) injection 10 mg  10 mg Intramuscular TID PRN Tor Freed, FNP       And   diphenhydrAMINE  (BENADRYL ) injection 50 mg  50 mg Intramuscular TID PRN Tor Freed, FNP       And   LORazepam  (ATIVAN ) injection 2 mg  2 mg Intramuscular TID PRN Allen, Tina L, FNP       hydrOXYzine  (ATARAX ) tablet 25 mg  25 mg Oral TID PRN Ajibola, Ene A, NP       magnesium  hydroxide (MILK OF MAGNESIA) suspension 30 mL  30 mL Oral Daily PRN Ajibola, Ene A, NP       traZODone  (DESYREL ) tablet 50 mg  50 mg Oral QHS PRN Ajibola, Ene A, NP       Current Outpatient Medications  Medication Sig Dispense Refill   FLUoxetine  (PROZAC ) 20 MG capsule Take 1 capsule (20 mg total) by mouth daily. 30 capsule 0   hydrOXYzine  (ATARAX ) 25 MG tablet Take 1 tablet (25 mg total) by mouth 3 (three) times daily as needed for anxiety. 30 tablet 0   traZODone  (DESYREL ) 150 MG tablet Take 1 tablet (150 mg total) by mouth at bedtime as needed for sleep. 30 tablet 0   [START ON 08/07/2023] paliperidone  (INVEGA  SUSTENNA) 156 MG/ML SUSY injection Inject 1 mL (156 mg total) into the muscle every 28 (twenty-eight) days. Patient last received on 07/10/23 and is due for next LAI maintenance dose of 156 mg on 08/07/23 1 mL 2   polyethylene glycol (MIRALAX  / GLYCOLAX ) 17 g packet Take 17 g by mouth daily. (Patient not taking: Reported on 07/28/2023) 30 packet 0    Labs  Lab Results:  Admission on 07/28/2023  Component Date Value Ref Range Status   WBC 07/28/2023 7.2  4.0 - 10.5 K/uL Final   RBC 07/28/2023 4.63  3.87 - 5.11 MIL/uL Final   Hemoglobin 07/28/2023 12.0  12.0 - 15.0 g/dL Final   HCT 19/14/7829 38.8  36.0 - 46.0 % Final   MCV 07/28/2023 83.8  80.0 - 100.0 fL Final   MCH 07/28/2023 25.9 (L)  26.0 - 34.0 pg Final   MCHC  07/28/2023 30.9  30.0 - 36.0 g/dL Final   RDW 56/21/3086 14.9  11.5 - 15.5 % Final   Platelets 07/28/2023 317  150 - 400 K/uL Final   nRBC 07/28/2023 0.0  0.0 - 0.2 % Final   Neutrophils Relative % 07/28/2023 55  % Final   Neutro Abs 07/28/2023 4.0  1.7 - 7.7 K/uL Final   Lymphocytes Relative 07/28/2023 34  % Final   Lymphs Abs 07/28/2023 2.4  0.7 - 4.0 K/uL Final   Monocytes  Relative 07/28/2023 8  % Final   Monocytes Absolute 07/28/2023 0.6  0.1 - 1.0 K/uL Final   Eosinophils Relative 07/28/2023 2  % Final   Eosinophils Absolute 07/28/2023 0.1  0.0 - 0.5 K/uL Final   Basophils Relative 07/28/2023 1  % Final   Basophils Absolute 07/28/2023 0.1  0.0 - 0.1 K/uL Final   Immature Granulocytes 07/28/2023 0  % Final   Abs Immature Granulocytes 07/28/2023 0.01  0.00 - 0.07 K/uL Final   Performed at [facility], [address], [place], [place] [address]   Sodium 07/28/2023 137  135 - 145 mmol/L Final   Potassium 07/28/2023 3.9  3.5 - 5.1 mmol/L Final   Chloride 07/28/2023 106  98 - 111 mmol/L Final   CO2 07/28/2023 19 (L)  22 - 32 mmol/L Final   Glucose, Bld 07/28/2023 105 (H)  70 - 99 mg/dL Final   Glucose reference range applies only to samples taken after fasting for at least 8 hours.   BUN 07/28/2023 8  6 - 20 mg/dL Final   Creatinine, Ser 07/28/2023 0.69  0.44 - 1.00 mg/dL Final   Calcium 60/45/4098 9.6  8.9 - 10.3 mg/dL Final   Total Protein 11/91/4782 7.4  6.5 - 8.1 g/dL Final   Albumin 95/62/1308 3.9  3.5 - 5.0 g/dL Final   AST 65/78/4696 17  15 - 41 U/L Final   ALT 07/28/2023 21  0 - 44 U/L Final   Alkaline Phosphatase 07/28/2023 114  38 - 126 U/L Final   Total Bilirubin 07/28/2023 0.5  0.0 - 1.2 mg/dL Final   GFR, Estimated 07/28/2023 >60  >60 mL/min Final   Comment: (NOTE) Calculated using the CKD-EPI Creatinine Equation (2021)    Anion gap 07/28/2023 12  5 - 15 Final   Performed at [facility], [address], [place], [place] [address]   Hgb A1c MFr Bld  07/28/2023 5.0  4.8 - 5.6 % Final   Comment: (NOTE) Pre diabetes:          5.7%-6.4%  Diabetes:              >6.4%  Glycemic control for   <7.0% adults with diabetes    Mean Plasma Glucose 07/28/2023 96.8  mg/dL Final   Performed at [facility], [address], [place], [place] [address]   Alcohol, Ethyl (B) 07/28/2023 <15  <15 mg/dL Final   Comment: Please note change in reference range. (NOTE) For medical purposes only. Performed at [facility], [address], [place], [place] [address]    Cholesterol 07/28/2023 146  0 - 200 mg/dL Final   Triglycerides 72/53/6644 48  <150 mg/dL Final   HDL 03/47/4259 41  >40 mg/dL Final   Total CHOL/HDL Ratio 07/28/2023 3.6  RATIO Final   VLDL 07/28/2023 10  0 - 40 mg/dL Final   LDL Cholesterol 07/28/2023 95  0 - 99 mg/dL Final   Comment:        Total Cholesterol/HDL:CHD Risk Coronary Heart Disease Risk Table                     Men   Women  1/2 Average Risk   3.4   3.3  Average Risk       5.0   4.4  2 X Average Risk   9.6   7.1  3 X Average Risk  23.4   11.0  Use the calculated Patient Ratio above and the CHD Risk Table to determine the patient's CHD Risk.        ATP III CLASSIFICATION (LDL):  <100     mg/dL   Optimal  161-096  mg/dL   Near or Above                    Optimal  130-159  mg/dL   Borderline  045-409  mg/dL   High  >811     mg/dL   Very High Performed at [facility], [address], [place], [place] [address]    TSH 07/28/2023 3.255  0.350 - 4.500 uIU/mL Final   Comment: Performed by a 3rd Generation assay with a functional sensitivity of <=0.01 uIU/mL. Performed at [facility], [address], [place], [place] [address]    POC Amphetamine UR 07/28/2023 None Detected  NONE DETECTED (Cut Off Level 1000 ng/mL) Final   POC Secobarbital (BAR) 07/28/2023 None Detected  NONE DETECTED (Cut Off Level 300 ng/mL) Final   POC Buprenorphine (BUP) 07/28/2023 None Detected  NONE DETECTED (Cut  Off Level 10 ng/mL) Final   POC Oxazepam (BZO) 07/28/2023 None Detected  NONE DETECTED (Cut Off Level 300 ng/mL) Final   POC Cocaine UR 07/28/2023 None Detected  NONE DETECTED (Cut Off Level 300 ng/mL) Final   POC Methamphetamine UR 07/28/2023 None Detected  NONE DETECTED (Cut Off Level 1000 ng/mL) Final   POC Morphine  07/28/2023 None Detected  NONE DETECTED (Cut Off Level 300 ng/mL) Final   POC Methadone UR 07/28/2023 None Detected  NONE DETECTED (Cut Off Level 300 ng/mL) Final   POC Oxycodone  UR 07/28/2023 None Detected  NONE DETECTED (Cut Off Level 100 ng/mL) Final   POC Marijuana UR 07/28/2023 Positive (A)  NONE DETECTED (Cut Off Level 50 ng/mL) Final   Preg Test, Ur 07/28/2023 NEGATIVE  NEGATIVE Final   Comment:        THE SENSITIVITY OF THIS METHODOLOGY IS >24 mIU/mL    Preg Test, Ur 07/28/2023 NEGATIVE  NEGATIVE Final   Comment:        THE SENSITIVITY OF THIS METHODOLOGY IS >24 mIU/mL   Admission on 07/12/2023, Discharged on 07/13/2023  Component Date Value Ref Range Status   WBC 07/12/2023 8.6  4.0 - 10.5 K/uL Final   RBC 07/12/2023 4.86  3.87 - 5.11 MIL/uL Final   Hemoglobin 07/12/2023 12.6  12.0 - 15.0 g/dL Final   HCT 13/10/6576 40.5  36.0 - 46.0 % Final   MCV 07/12/2023 83.3  80.0 - 100.0 fL Final   MCH 07/12/2023 25.9 (L)  26.0 - 34.0 pg Final   MCHC 07/12/2023 31.1  30.0 - 36.0 g/dL Final   RDW 46/96/2952 14.8  11.5 - 15.5 % Final   Platelets 07/12/2023 335  150 - 400 K/uL Final   nRBC 07/12/2023 0.0  0.0 - 0.2 % Final   Neutrophils Relative % 07/12/2023 67  % Final   Neutro Abs 07/12/2023 5.8  1.7 - 7.7 K/uL Final   Lymphocytes Relative 07/12/2023 23  % Final   Lymphs Abs 07/12/2023 2.0  0.7 - 4.0 K/uL Final   Monocytes Relative 07/12/2023 8  % Final   Monocytes Absolute 07/12/2023 0.6  0.1 - 1.0 K/uL Final   Eosinophils Relative 07/12/2023 1  % Final   Eosinophils Absolute 07/12/2023 0.0  0.0 - 0.5 K/uL Final   Basophils Relative 07/12/2023 1  % Final    Basophils Absolute 07/12/2023  0.1  0.0 - 0.1 K/uL Final   Immature Granulocytes 07/12/2023 0  % Final   Abs Immature Granulocytes 07/12/2023 0.01  0.00 - 0.07 K/uL Final   Performed at [facility], [address], [place], [place] [address]   Sodium 07/12/2023 140  135 - 145 mmol/L Final   Potassium 07/12/2023 4.0  3.5 - 5.1 mmol/L Final   Chloride 07/12/2023 106  98 - 111 mmol/L Final   CO2 07/12/2023 23  22 - 32 mmol/L Final   Glucose, Bld 07/12/2023 104 (H)  70 - 99 mg/dL Final   Glucose reference range applies only to samples taken after fasting for at least 8 hours.   BUN 07/12/2023 6  6 - 20 mg/dL Final   Creatinine, Ser 07/12/2023 0.79  0.44 - 1.00 mg/dL Final   Calcium 19/14/7829 9.9  8.9 - 10.3 mg/dL Final   Total Protein 56/21/3086 7.4  6.5 - 8.1 g/dL Final   Albumin 57/84/6962 3.9  3.5 - 5.0 g/dL Final   AST 95/28/4132 20  15 - 41 U/L Final   ALT 07/12/2023 23  0 - 44 U/L Final   Alkaline Phosphatase 07/12/2023 94  38 - 126 U/L Final   Total Bilirubin 07/12/2023 0.4  0.0 - 1.2 mg/dL Final   GFR, Estimated 07/12/2023 >60  >60 mL/min Final   Comment: (NOTE) Calculated using the CKD-EPI Creatinine Equation (2021)    Anion gap 07/12/2023 11  5 - 15 Final   Performed at [facility], [address], [place], [place] [address]   Magnesium  07/12/2023 2.0  1.7 - 2.4 mg/dL Final   Performed at [facility], [address] [place], [place] [address]   Alcohol, Ethyl (B) 07/12/2023 <15  <15 mg/dL Final   Comment: Please note change in reference range. (NOTE) For medical purposes only. Performed at [facility], [address] [place], [place] [address]    TSH 07/12/2023 2.632  0.350 - 4.500 uIU/mL Final   Comment: Performed by a 3rd Generation assay with a functional sensitivity of <=0.01 uIU/mL. Performed at [facility], [address] [place], [place] [address]    Prolactin 07/12/2023 14.2  4.8 - 33.4 ng/mL Final   Comment: (NOTE) Performed  At: [facility] [address] [place], [place] [id number] Pearlean Botts MD [id number]    POC Amphetamine UR 07/12/2023 None Detected  NONE DETECTED (Cut Off Level 1000 ng/mL) Final   POC Secobarbital (BAR) 07/12/2023 None Detected  NONE DETECTED (Cut Off Level 300 ng/mL) Final   POC Buprenorphine (BUP) 07/12/2023 None Detected  NONE DETECTED (Cut Off Level 10 ng/mL) Final   POC Oxazepam (BZO) 07/12/2023 Positive (A)  NONE DETECTED (Cut Off Level 300 ng/mL) Final   POC Cocaine UR 07/12/2023 None Detected  NONE DETECTED (Cut Off Level 300 ng/mL) Final   POC Methamphetamine UR 07/12/2023 None Detected  NONE DETECTED (Cut Off Level 1000 ng/mL) Final   POC Morphine  07/12/2023 None Detected  NONE DETECTED (Cut Off Level 300 ng/mL) Final   POC Methadone UR 07/12/2023 None Detected  NONE DETECTED (Cut Off Level 300 ng/mL) Final   POC Oxycodone  UR 07/12/2023 None Detected  NONE DETECTED (Cut Off Level 100 ng/mL) Final   POC Marijuana UR 07/12/2023 Positive (A)  NONE DETECTED (Cut Off Level 50 ng/mL) Final   Glucose-Capillary 07/12/2023 135 (H)  70 - 99 mg/dL Final   Glucose reference range applies only to samples taken after fasting  for at least 8 hours.  Admission on 07/01/2023, Discharged on 07/12/2023  Component Date Value Ref Range Status   WBC 07/03/2023 5.8  4.0 - 10.5 K/uL Final   RBC 07/03/2023 4.93  3.87 - 5.11 MIL/uL Final   Hemoglobin 07/03/2023 12.8  12.0 - 15.0 g/dL Final   HCT 56/21/3086 41.7  36.0 - 46.0 % Final   MCV 07/03/2023 84.6  80.0 - 100.0 fL Final   MCH 07/03/2023 26.0  26.0 - 34.0 pg Final   MCHC 07/03/2023 30.7  30.0 - 36.0 g/dL Final   RDW 57/84/6962 15.4  11.5 - 15.5 % Final   Platelets 07/03/2023 363  150 - 400 K/uL Final   nRBC 07/03/2023 0.0  0.0 - 0.2 % Final   Neutrophils Relative % 07/03/2023 54  % Final   Neutro Abs 07/03/2023 3.1  1.7 - 7.7 K/uL Final   Lymphocytes Relative 07/03/2023 34  % Final   Lymphs Abs 07/03/2023 2.0  0.7 - 4.0 K/uL Final    Monocytes Relative 07/03/2023 9  % Final   Monocytes Absolute 07/03/2023 0.5  0.1 - 1.0 K/uL Final   Eosinophils Relative 07/03/2023 2  % Final   Eosinophils Absolute 07/03/2023 0.1  0.0 - 0.5 K/uL Final   Basophils Relative 07/03/2023 1  % Final   Basophils Absolute 07/03/2023 0.1  0.0 - 0.1 K/uL Final   Immature Granulocytes 07/03/2023 0  % Final   Abs Immature Granulocytes 07/03/2023 0.01  0.00 - 0.07 K/uL Final   Performed at [facility], [address] [place], [place] [address]   Sodium 07/03/2023 137  135 - 145 mmol/L Final   Potassium 07/03/2023 3.6  3.5 - 5.1 mmol/L Final   Chloride 07/03/2023 105  98 - 111 mmol/L Final   CO2 07/03/2023 21 (L)  22 - 32 mmol/L Final   Glucose, Bld 07/03/2023 97  70 - 99 mg/dL Final   Glucose reference range applies only to samples taken after fasting for at least 8 hours.   BUN 07/03/2023 6  6 - 20 mg/dL Final   Creatinine, Ser 07/03/2023 0.75  0.44 - 1.00 mg/dL Final   Calcium 13/24/4010 9.5  8.9 - 10.3 mg/dL Final   Total Protein 27/25/3664 7.9  6.5 - 8.1 g/dL Final   Albumin 40/34/7425 4.0  3.5 - 5.0 g/dL Final   AST 95/63/8756 50 (H)  15 - 41 U/L Final   ALT 07/03/2023 86 (H)  0 - 44 U/L Final   Alkaline Phosphatase 07/03/2023 114  38 - 126 U/L Final   Total Bilirubin 07/03/2023 0.8  0.0 - 1.2 mg/dL Final   GFR, Estimated 07/03/2023 >60  >60 mL/min Final   Comment: (NOTE) Calculated using the CKD-EPI Creatinine Equation (2021)    Anion gap 07/03/2023 11  5 - 15 Final   Performed at [facility], [address] [place], [place] [address]   Vitamin B-12 07/03/2023 409  180 - 914 pg/mL Final   Comment: (NOTE) This assay is not validated for testing neonatal or myeloproliferative syndrome specimens for Vitamin B12 levels. Performed at [facility], [address], [place], [place] [address]    Folate 07/03/2023 14.1  >5.9 ng/mL Final   Performed at [facility], [address] [place], [place] [address]  Admission on 06/30/2023, Discharged on 07/01/2023  Component Date Value Ref Range Status   Sodium 06/30/2023 136  135 - 145 mmol/L Final   Potassium 06/30/2023  3.8  3.5 - 5.1 mmol/L Final   Chloride 06/30/2023 106  98 - 111 mmol/L Final   CO2 06/30/2023 24  22 - 32 mmol/L Final   Glucose, Bld 06/30/2023 109 (H)  70 - 99 mg/dL Final   Glucose reference range applies only to samples taken after fasting for at least 8 hours.   BUN 06/30/2023 8  6 - 20 mg/dL Final   Creatinine, Ser 06/30/2023 0.67  0.44 - 1.00 mg/dL Final   Calcium 16/12/9602 9.0  8.9 - 10.3 mg/dL Final   Total Protein 54/11/8117 7.8  6.5 - 8.1 g/dL Final   Albumin 14/78/2956 3.8  3.5 - 5.0 g/dL Final   AST 21/30/8657 95 (H)  15 - 41 U/L Final   ALT 06/30/2023 96 (H)  0 - 44 U/L Final   Alkaline Phosphatase 06/30/2023 115  38 - 126 U/L Final   Total Bilirubin 06/30/2023 0.3  0.0 - 1.2 mg/dL Final   GFR, Estimated 06/30/2023 >60  >60 mL/min Final   Comment: (NOTE) Calculated using the CKD-EPI Creatinine Equation (2021)    Anion gap 06/30/2023 6  5 - 15 Final   Performed at [facility], [address], [place], [place] [address]   Alcohol, Ethyl (B) 06/30/2023 <10  <10 mg/dL Final   Comment: (NOTE) Lowest detectable limit for serum alcohol is 10 mg/dL.  For medical purposes only. Performed at [facility], [address] [place], [place] [address]    Opiates 06/30/2023 NONE DETECTED  NONE DETECTED Final   Cocaine 06/30/2023 NONE DETECTED  NONE DETECTED Final   Benzodiazepines 06/30/2023 NONE DETECTED  NONE DETECTED Final   Amphetamines 06/30/2023 NONE DETECTED  NONE DETECTED Final   Tetrahydrocannabinol 06/30/2023 POSITIVE (A)  NONE DETECTED Final   Barbiturates 06/30/2023 NONE DETECTED  NONE DETECTED Final   Comment: (NOTE) DRUG SCREEN FOR MEDICAL PURPOSES ONLY.  IF CONFIRMATION IS NEEDED FOR ANY PURPOSE, NOTIFY  LAB WITHIN 5 DAYS.  LOWEST DETECTABLE LIMITS FOR URINE DRUG SCREEN Drug Class                     Cutoff (ng/mL) Amphetamine and metabolites    1000 Barbiturate and metabolites    200 Benzodiazepine                 200 Opiates and metabolites        300 Cocaine and metabolites        300 THC                            50 Performed at [facility], [address], [place], [place] [address]    WBC 06/30/2023 6.6  4.0 - 10.5 K/uL Final   RBC 06/30/2023 4.54  3.87 - 5.11 MIL/uL Final   Hemoglobin 06/30/2023 11.9 (L)  12.0 - 15.0 g/dL Final   HCT 40/12/2723 38.5  36.0 - 46.0 % Final   MCV 06/30/2023 84.8  80.0 - 100.0 fL Final   MCH 06/30/2023 26.2  26.0 - 34.0 pg Final   MCHC 06/30/2023 30.9  30.0 - 36.0 g/dL Final   RDW 36/64/4034 15.4  11.5 - 15.5 % Final   Platelets 06/30/2023 338  150 - 400 K/uL Final   nRBC 06/30/2023 0.0  0.0 - 0.2 % Final   Neutrophils Relative % 06/30/2023 60  % Final   Neutro Abs 06/30/2023 4.0  1.7 - 7.7 K/uL  Final   Lymphocytes Relative 06/30/2023 29  % Final   Lymphs Abs 06/30/2023 1.9  0.7 - 4.0 K/uL Final   Monocytes Relative 06/30/2023 8  % Final   Monocytes Absolute 06/30/2023 0.5  0.1 - 1.0 K/uL Final   Eosinophils Relative 06/30/2023 2  % Final   Eosinophils Absolute 06/30/2023 0.1  0.0 - 0.5 K/uL Final   Basophils Relative 06/30/2023 1  % Final   Basophils Absolute 06/30/2023 0.1  0.0 - 0.1 K/uL Final   Immature Granulocytes 06/30/2023 0  % Final   Abs Immature Granulocytes 06/30/2023 0.02  0.00 - 0.07 K/uL Final   Performed at [facility], [address], [place], [place] [address]   Preg, Serum 06/30/2023 NEGATIVE  NEGATIVE Final   Comment:        THE SENSITIVITY OF THIS METHODOLOGY IS >10 mIU/mL. Performed at [facility], [address], [place], [place] [address]   Admission on 06/09/2023, Discharged on 06/10/2023  Component Date Value Ref Range Status   WBC 06/09/2023 7.3   4.0 - 10.5 K/uL Final   RBC 06/09/2023 4.97  3.87 - 5.11 MIL/uL Final   Hemoglobin 06/09/2023 12.9  12.0 - 15.0 g/dL Final   HCT 82/95/6213 41.5  36.0 - 46.0 % Final   MCV 06/09/2023 83.5  80.0 - 100.0 fL Final   MCH 06/09/2023 26.0  26.0 - 34.0 pg Final   MCHC 06/09/2023 31.1  30.0 - 36.0 g/dL Final   RDW 08/65/7846 15.4  11.5 - 15.5 % Final   Platelets 06/09/2023 337  150 - 400 K/uL Final   nRBC 06/09/2023 0.0  0.0 - 0.2 % Final   Neutrophils Relative % 06/09/2023 65  % Final   Neutro Abs 06/09/2023 4.8  1.7 - 7.7 K/uL Final   Lymphocytes Relative 06/09/2023 25  % Final   Lymphs Abs 06/09/2023 1.8  0.7 - 4.0 K/uL Final   Monocytes Relative 06/09/2023 8  % Final   Monocytes Absolute 06/09/2023 0.6  0.1 - 1.0 K/uL Final   Eosinophils Relative 06/09/2023 1  % Final   Eosinophils Absolute 06/09/2023 0.0  0.0 - 0.5 K/uL Final   Basophils Relative 06/09/2023 1  % Final   Basophils Absolute 06/09/2023 0.1  0.0 - 0.1 K/uL Final   Immature Granulocytes 06/09/2023 0  % Final   Abs Immature Granulocytes 06/09/2023 0.01  0.00 - 0.07 K/uL Final   Performed at [facility], [address], [place], [place] [address]   Sodium 06/09/2023 136  135 - 145 mmol/L Final   Potassium 06/09/2023 4.1  3.5 - 5.1 mmol/L Final   Chloride 06/09/2023 105  98 - 111 mmol/L Final   CO2 06/09/2023 22  22 - 32 mmol/L Final   Glucose, Bld 06/09/2023 89  70 - 99 mg/dL Final   Glucose reference range applies only to samples taken after fasting for at least 8 hours.   BUN 06/09/2023 6  6 - 20 mg/dL Final   Creatinine, Ser 06/09/2023 0.66  0.44 - 1.00 mg/dL Final   Calcium 28/41/3244 9.5  8.9 - 10.3 mg/dL Final   Total Protein 03/22/7251 7.4  6.5 - 8.1 g/dL Final   Albumin 66/44/0347 4.1  3.5 - 5.0 g/dL Final   AST 42/59/5638 22  15 - 41 U/L Final   ALT 06/09/2023 17  0 - 44 U/L Final   Alkaline Phosphatase 06/09/2023 97  38 - 126 U/L Final   Total Bilirubin 06/09/2023  0.9  0.0 - 1.2 mg/dL Final   GFR,  Estimated 06/09/2023 >60  >60 mL/min Final   Comment: (NOTE) Calculated using the CKD-EPI Creatinine Equation (2021)    Anion gap 06/09/2023 9  5 - 15 Final   Performed at [facility], [address], [place], [place] [address]   Hgb A1c MFr Bld 06/09/2023 4.9  4.8 - 5.6 % Final   Comment: (NOTE) Pre diabetes:          5.7%-6.4%  Diabetes:              >6.4%  Glycemic control for   <7.0% adults with diabetes    Mean Plasma Glucose 06/09/2023 93.93  mg/dL Final   Performed at [facility], [address] [place], [place] [address]   Alcohol, Ethyl (B) 06/09/2023 <10  <10 mg/dL Final   Comment: (NOTE) Lowest detectable limit for serum alcohol is 10 mg/dL.  For medical purposes only. Performed at [facility], [address] [place], [place] [address]    Cholesterol 06/09/2023 154  0 - 200 mg/dL Final   Triglycerides 91/47/8295 51  <150 mg/dL Final   HDL 62/13/0865 43  >40 mg/dL Final   Total CHOL/HDL Ratio 06/09/2023 3.6  RATIO Final   VLDL 06/09/2023 10  0 - 40 mg/dL Final   LDL Cholesterol 06/09/2023 101 (H)  0 - 99 mg/dL Final   Comment:        Total Cholesterol/HDL:CHD Risk Coronary Heart Disease Risk Table                     Men   Women  1/2 Average Risk   3.4   3.3  Average Risk       5.0   4.4  2 X Average Risk   9.6   7.1  3 X Average Risk  23.4   11.0        Use the calculated Patient Ratio above and the CHD Risk Table to determine the patient's CHD Risk.        ATP III CLASSIFICATION (LDL):  <100     mg/dL   Optimal  784-696  mg/dL   Near or Above                    Optimal  130-159  mg/dL   Borderline  295-284  mg/dL   High  >132     mg/dL   Very High Performed at [facility], [address], [place], [place] [address]    RPR Ser Ql 06/09/2023 NON REACTIVE  NON REACTIVE Final   Performed at [facility], [address], [place], [place] [address]   Color, Urine 06/09/2023 YELLOW  YELLOW Final   APPearance 06/09/2023  CLEAR  CLEAR Final   Specific Gravity, Urine 06/09/2023 <1.005 (L)  1.005 - 1.030 Final   pH 06/09/2023 6.5  5.0 - 8.0 Final   Glucose, UA 06/09/2023 NEGATIVE  NEGATIVE mg/dL Final   Hgb urine dipstick 06/09/2023 LARGE (A)  NEGATIVE Final   Bilirubin Urine 06/09/2023 NEGATIVE  NEGATIVE Final   Ketones, ur 06/09/2023 NEGATIVE  NEGATIVE mg/dL Final   Protein, ur 66/44/0347 NEGATIVE  NEGATIVE mg/dL Final   Nitrite 42/59/5638 NEGATIVE  NEGATIVE Final   Leukocytes,Ua 06/09/2023 NEGATIVE  NEGATIVE Final   Performed at [facility], [address], [place], [place] [address]   Preg Test, Ur 06/09/2023 Negative  Negative Final   POC Amphetamine UR 06/09/2023 None Detected  NONE DETECTED (Cut Off Level 1000 ng/mL) Final   POC Secobarbital (BAR) 06/09/2023 None Detected  NONE DETECTED (Cut Off Level 300 ng/mL) Final   POC Buprenorphine (BUP) 06/09/2023 None Detected  NONE DETECTED (Cut Off Level 10 ng/mL) Final   POC Oxazepam (BZO) 06/09/2023 None Detected  NONE DETECTED (Cut Off Level 300 ng/mL) Final   POC Cocaine UR 06/09/2023 None Detected  NONE DETECTED (Cut Off Level 300 ng/mL) Final   POC Methamphetamine UR 06/09/2023 None Detected  NONE DETECTED (Cut Off Level 1000 ng/mL) Final   POC Morphine  06/09/2023 None Detected  NONE DETECTED (Cut Off Level 300 ng/mL) Final   POC Methadone UR 06/09/2023 None Detected  NONE DETECTED (Cut Off Level 300 ng/mL) Final   POC Oxycodone  UR 06/09/2023 None Detected  NONE DETECTED (Cut Off Level 100 ng/mL) Final   POC Marijuana UR 06/09/2023 Positive (A)  NONE DETECTED (Cut Off Level 50 ng/mL) Final   HIV Screen 4th Generation wRfx 06/09/2023 Non Reactive  Non Reactive Final   Performed at [facility], [address], [place], [place] [address]   TSH 06/09/2023 2.309  0.350 - 4.500 uIU/mL Final   Comment: Performed by a 3rd Generation assay with a functional sensitivity of <=0.01 uIU/mL. Performed at [facility], [address]  [place], [place] [address]    RBC / HPF 06/09/2023 0-5  0 - 5 RBC/hpf Final   WBC, UA 06/09/2023 NONE SEEN  0 - 5 WBC/hpf Final   Bacteria, UA 06/09/2023 RARE (A)  NONE SEEN Final   Squamous Epithelial / HPF 06/09/2023 0-5  0 - 5 /HPF Final   Amorphous Crystal 06/09/2023 PRESENT   Final   Performed at [facility], [address] [place], [place] [address]  Admission on 03/29/2023, Discharged on 03/29/2023  Component Date Value Ref Range Status   WBC 03/29/2023 5.4  4.0 - 10.5 K/uL Final   RBC 03/29/2023 4.79  3.87 - 5.11 MIL/uL Final   Hemoglobin 03/29/2023 12.5  12.0 - 15.0 g/dL Final   HCT 52/84/1324 39.9  36.0 - 46.0 % Final   MCV 03/29/2023 83.3  80.0 - 100.0 fL Final   MCH 03/29/2023 26.1  26.0 - 34.0 pg Final   MCHC 03/29/2023 31.3  30.0 - 36.0 g/dL Final   RDW 40/12/2723 14.3  11.5 - 15.5 % Final   Platelets 03/29/2023 266  150 - 400 K/uL Final   nRBC 03/29/2023 0.0  0.0 - 0.2 % Final   Neutrophils Relative % 03/29/2023 50  % Final   Neutro Abs 03/29/2023 2.7  1.7 - 7.7 K/uL Final   Lymphocytes Relative 03/29/2023 39  % Final   Lymphs Abs 03/29/2023 2.1  0.7 - 4.0 K/uL Final   Monocytes Relative 03/29/2023 10  % Final   Monocytes Absolute 03/29/2023 0.5  0.1 - 1.0 K/uL Final   Eosinophils Relative 03/29/2023 1  % Final   Eosinophils Absolute 03/29/2023 0.0  0.0 - 0.5 K/uL Final   Basophils Relative 03/29/2023 0  % Final   Basophils Absolute 03/29/2023 0.0  0.0 - 0.1 K/uL Final   Immature Granulocytes 03/29/2023 0  % Final   Abs Immature Granulocytes 03/29/2023 0.01  0.00 - 0.07 K/uL Final   Performed at [facility], [address], [place], [place] [address]   Sodium 03/29/2023 136  135 - 145 mmol/L Final   Potassium 03/29/2023 3.6  3.5 - 5.1 mmol/L Final   Chloride 03/29/2023 105  98 - 111 mmol/L Final   CO2 03/29/2023 22  22 - 32 mmol/L Final   Glucose, Bld 03/29/2023 101 (H)  70 - 99 mg/dL Final   Glucose reference range applies only to samples taken after  fasting for at least 8 hours.   BUN 03/29/2023 5 (L)  6 - 20 mg/dL Final   Creatinine, Ser 03/29/2023 0.76  0.44 - 1.00 mg/dL Final   Calcium 19/14/7829 9.1  8.9 - 10.3 mg/dL Final   Total Protein 56/21/3086 7.0  6.5 - 8.1 g/dL Final   Albumin 57/84/6962 3.6  3.5 - 5.0 g/dL Final   AST 95/28/4132 32  15 - 41 U/L Final   ALT 03/29/2023 34  0 - 44 U/L Final   Alkaline Phosphatase 03/29/2023 114  38 - 126 U/L Final   Total Bilirubin 03/29/2023 0.4  0.0 - 1.2 mg/dL Final   GFR, Estimated 03/29/2023 >60  >60 mL/min Final   Comment: (NOTE) Calculated using the CKD-EPI Creatinine Equation (2021)    Anion gap 03/29/2023 9  5 - 15 Final   Performed at [facility], [address] [place], [place] [address]   Lipase 03/29/2023 43  11 - 51 U/L Final   Performed at [facility], [address], [place], [place] [address]   Color, Urine 03/29/2023 YELLOW  YELLOW Final   APPearance 03/29/2023 CLEAR  CLEAR Final   Specific Gravity, Urine 03/29/2023 1.014  1.005 - 1.030 Final   pH 03/29/2023 7.0  5.0 - 8.0 Final   Glucose, UA 03/29/2023 NEGATIVE  NEGATIVE mg/dL Final   Hgb urine dipstick 03/29/2023 NEGATIVE  NEGATIVE Final   Bilirubin Urine 03/29/2023 NEGATIVE  NEGATIVE Final   Ketones, ur 03/29/2023 NEGATIVE  NEGATIVE mg/dL Final   Protein, ur 66/44/0347 NEGATIVE  NEGATIVE mg/dL Final   Nitrite 42/59/5638 NEGATIVE  NEGATIVE Final   Leukocytes,Ua 03/29/2023 NEGATIVE  NEGATIVE Final   Performed at [facility], [address], [place], [place] [address]   Preg Test, Ur 03/29/2023 NEGATIVE  NEGATIVE Final   Comment:        THE SENSITIVITY OF THIS METHODOLOGY IS >25 mIU/mL. Performed at [facility], [address] [place], [place] [address]   Office Visit on 03/07/2023  Component Date Value Ref Range Status   Hemoglobin A1C 03/07/2023 5.1  4.0 - 5.6 % Final   Glucose-Capillary 03/07/2023 108 (H)  70 - 99 mg/dL Final   Glucose reference range applies only to samples  taken after fasting for at least 8 hours.    Blood Alcohol level:  Lab Results  Component Value Date   [facility] <15 07/28/2023   ETH <15 07/12/2023    Metabolic Disorder Labs: Lab Results  Component Value Date   HGBA1C 5.0 07/28/2023   MPG 96.8 07/28/2023   MPG 93.93 06/09/2023   Lab Results  Component Value Date   PROLACTIN 14.2 07/12/2023   Lab Results  Component Value Date   CHOL 146 07/28/2023   TRIG 48 07/28/2023   HDL 41 07/28/2023   CHOLHDL 3.6 07/28/2023   VLDL 10 07/28/2023   LDLCALC 95 07/28/2023   LDLCALC 101 (H) 06/09/2023    Therapeutic Lab Levels: No results found for: "LITHIUM" No results found for: "VALPROATE" No results found for: "CBMZ"  Physical Findings   AIMS    Flowsheet Row [facility]  AIMS Total Score 0      AUDIT    Flowsheet Row Ad[facility] (Discharged[facility] Clinical Support from 05/23/2023 in [facility] Of [place]. [facility]  Alcohol Use Disorder Identification Test Final Score (AUDIT) 6 3      GAD-7    Flowsheet Row Office Visit from 03/07/2023 in [facility] Dept Of [place]. [facility] Office Visit from 01/22/2023 in [facility] - [facility] Dept Of [place]. [facility] Routine Prenatal from [facility]/11/2021 in [facility]  Total GAD-7 Score 14 14 3       PHQ2-9    Flowsheet Row Clinical Support from 05/23/2023 in [facility] Of [place]. [facility] Office Visit from 03/07/2023 in [facility] - A Dept Of [place]. [facility] Office Visit from 01/22/2023 in [facility] - A Dept Of [place]. [facility] Routine Prenatal from [facility]/11/2021 in [facility]  PHQ-2  Total Score 0 4 4 1   PHQ-9 Total Score 1 12 14 9       Flowsheet Row ED from 07/28/2023 in [facility] ED from 07/12/2023 in [facility] Admission (Discharged) [facility]  C-SSRS RISK CATEGORY Low Risk No Risk No Risk        Musculoskeletal  Strength & Muscle Tone: within normal limits Gait & Station: normal Patient leans: N/A  Psychiatric Specialty Exam  Presentation  General Appearance:  Appropriate for Environment; Casual  Eye Contact: Fair  Speech: Clear and Coherent  Speech Volume: Decreased  Handedness: Right   Mood and Affect  Mood: Depressed  Affect: Congruent   Thought Process  Thought Processes: Coherent  Descriptions of Associations:Intact  Orientation:Full (Time, Place and Person)  Thought Content:Logical  Diagnosis of Schizophrenia or Schizoaffective disorder in past: Yes  Duration of Psychotic Symptoms: Greater than six months   Hallucinations:Hallucinations: Auditory Description of Auditory Hallucinations: 'I hear voices a lot of the time" Description of Visual Hallucinations: denies currrently  Ideas of Reference:None  Suicidal Thoughts:Suicidal Thoughts: Yes, Passive SI Active Intent and/or Plan: -- (Refused to disclose plan or intent) SI Passive Intent and/or Plan: Without Intent  Homicidal Thoughts:Homicidal Thoughts: No   Sensorium  Memory: Immediate Fair  Judgment: Fair  Insight: Shallow   Executive Functions  Concentration: Fair  Attention Span: Fair  Recall: Poor  Fund of Knowledge: Fair  Language: Fair   Psychomotor Activity  Psychomotor Activity: Psychomotor Activity: Normal   Assets  Assets: Communication Skills; Desire for Improvement; Housing; Health and safety inspector; Physical Health; Social Support; Resilience   Sleep  Sleep: Sleep: Fair Number of Hours of Sleep: 0  (unable to assess)   Nutritional Assessment (For OBS and FBC admissions only) Has the patient had a weight loss or gain of 10 pounds or more in the last 3 months?: No Has the patient had a decrease in food intake/or appetite?: No Does the patient have dental problems?: No Does the patient have eating habits or behaviors that may be indicators of an eating disorder including binging or inducing vomiting?: No Has the patient recently lost weight without trying?: 0 Has the patient been eating poorly because of a decreased appetite?: 0  Malnutrition Screening Tool Score: 0    Physical Exam  Physical Exam Vitals and nursing note reviewed.  Constitutional:      Appearance: Normal appearance.  HENT:     Head: Normocephalic.     Nose: Nose normal.  Cardiovascular:     Rate and Rhythm: Normal rate.     Pulses: Normal pulses.  Pulmonary:     Effort: Pulmonary effort is normal.  Abdominal:     General: There is no distension.     Tenderness: There is no abdominal tenderness.  Musculoskeletal:        General: Normal range of motion.     Cervical back: Normal range of motion.  Skin:    General: Skin is warm and dry.  Neurological:     General: No focal deficit present.     Mental Status: She is alert and oriented to person, place, and time. Mental status is at baseline.  Psychiatric:        Attention and Perception: She is inattentive. She perceives auditory hallucinations.        Mood and Affect: Mood is anxious and elated.        Speech: Speech normal.        Behavior: Behavior is cooperative.        Thought Content: Thought content is paranoid.        Cognition and Memory: Cognition is impaired. Memory is impaired.        Judgment: Judgment is impulsive.    Review of Systems  Constitutional: Negative.   HENT: Negative.    Eyes: Negative.   Respiratory: Negative.    Cardiovascular: Negative.   Gastrointestinal: Negative.   Genitourinary: Negative.   Musculoskeletal:  Negative.   Skin: Negative.   Neurological: Negative.   Psychiatric/Behavioral:  Positive for hallucinations and substance abuse. The patient is nervous/anxious.    Blood pressure 118/79, pulse 78, temperature 98 F (36.7 C), temperature source Oral, resp. rate 18, last menstrual period 06/01/2023, SpO2 98%, unknown if currently breastfeeding. There is no height or weight on file to calculate BMI.  Treatment Plan Summary: Daily contact with patient to assess and evaluate symptoms and progress in treatment, Medication management, and Plan   Holliann has been accepted to [facility], 07/30/23. She will be transported via safe transport since she remains voluntary at this time.  Phoebe Breed, NP 07/29/2023 12:48 PM

## 2023-07-29 NOTE — ED Notes (Signed)
 Pt showered and had lunch.  No other observations of RIS noted since earlier in shift.  Pt  has been otherwise calm and polite

## 2023-07-29 NOTE — ED Notes (Signed)
 Pt provided dinner.

## 2023-07-29 NOTE — ED Notes (Signed)
 Patient had stripped her clothes off again walking around the unit exposed expressing that the scrubs had touched the floor and she was no longer able to wear them. Patient was again instructed/advised that she was unable to walk around the unit exposed. Scrubs were given by MHT and patient was able to be verbally directed to dress. Continuing to monitor.

## 2023-07-29 NOTE — ED Notes (Signed)
 Pt has been calm and cooperative.  Behaviors appropriate. Hourly observations for safety in place

## 2023-07-29 NOTE — ED Notes (Signed)
 Patient observed/assessed in bed/chair resting quietly appearing in no distress and verbalizing no complaints at this time. Will continue to monitor.

## 2023-07-30 DIAGNOSIS — F331 Major depressive disorder, recurrent, moderate: Secondary | ICD-10-CM | POA: Diagnosis not present

## 2023-07-30 NOTE — ED Notes (Signed)
 RN gave report to Kendal Pauling RN at [facility] at approx 930am r/t upcoming discharge today.

## 2023-07-30 NOTE — ED Provider Notes (Signed)
 [facility]BC/OBS ASAP Discharge Summary  Date and Time: 07/30/2023 10:59 AM  Name: Carly Brooks  MRN:  [id number]   Discharge Diagnoses:  Final diagnoses:  MDD (major depressive disorder), recurrent episode, moderate (HCC)  HPI on admission 07/28/2023 [facility]: "Carly Brooks is a 28 year old female with a history of schizophrenia, cannabis abuse, and depression who was brought in for a mental health evaluation by mobile crisis. Upon initial contact, she was observed walking in the hallway without any clothes. She was redirected to the assessment room and provided clothing. The patient reports experiencing passive suicidal ideation and auditory hallucinations, which she attributes to "a lot of triggers," but she declines to elaborate or provide further details. Throughout the assessment, she consistently refused to participate, repeatedly stating, "I'm tired of repeating myself," despite this provider explaining the importance of gathering information for her care".   Patient recommended for inpatient psychiatric admission and admitted to the continuous assessment unit while awaiting bed availability.  Patient is a face-to-face by this provider, chart review, and case consulted with Dr. He will on 07/30/2023.  UDS on admission positive for marijuana.  EtOH negative.  Subjective:   Currently patient is observed walking back to her bed on the unit.  She is bizarre and makes minimal eye contact.  She is withdrawn.  She pauses often when answering questions and appears to be having some thought blocking.  She continues to endorse depression and identifies being a mother as one of them.  Reports she has a 47 and 73-year-old children at home and it is difficult, she has minimal support.  She continues to endorse suicidal ideations without any specific plan.  She denies homicidal ideations.  She endorses auditory hallucinations of hearing a woman's voice talk to her.  Reports this voice is more  conversational and is not bothersome nor does it scare her.  She denies visual hallucinations.  She currently does not appear to be responding to internal/external stimuli.  Discussed inpatient psychiatric admission and patient continues to be in agreement.  Stay Summary:   Patient recommended for inpatient psychiatric admission and has been accepted at old [facility].  Total Time spent with patient: 20 minutes  Past Psychiatric History: See H&P Past Medical History: See H&P Family History: See H&P Family Psychiatric History: See H&P Social History: See H&P Tobacco Cessation:  N/A, patient does not currently use tobacco products  Current Medications:  Current Facility-Administered Medications  Medication Dose Route Frequency Provider Last Rate Last Admin   acetaminophen  (TYLENOL ) tablet 650 mg  650 mg Oral Q6H PRN Ajibola, Ene A, NP       alum & mag hydroxide-simeth (MAALOX/MYLANTA) 200-200-20 MG/5ML suspension 30 mL  30 mL Oral Q4H PRN Ajibola, Ene A, NP       haloperidol  (HALDOL ) tablet 5 mg  5 mg Oral TID PRN Tor Freed, FNP       And   diphenhydrAMINE  (BENADRYL ) capsule 50 mg  50 mg Oral TID PRN Tor Freed, FNP       haloperidol  lactate (HALDOL ) injection 5 mg  5 mg Intramuscular TID PRN Tor Freed, FNP       And   diphenhydrAMINE  (BENADRYL ) injection 50 mg  50 mg Intramuscular TID PRN Tor Freed, FNP       And   LORazepam  (ATIVAN ) injection 2 mg  2 mg Intramuscular TID PRN Tor Freed, FNP       haloperidol  lactate (HALDOL ) injection 10 mg  10 mg Intramuscular TID PRN Tor Freed, FNP       And   diphenhydrAMINE  (BENADRYL ) injection 50 mg  50 mg Intramuscular TID PRN Tor Freed, FNP       And   LORazepam  (ATIVAN ) injection 2 mg  2 mg Intramuscular TID PRN Allen, Tina L, FNP       hydrOXYzine  (ATARAX ) tablet 25 mg  25 mg Oral TID PRN Ajibola, Ene A, NP   25 mg at 07/29/23 2025   magnesium  hydroxide (MILK OF MAGNESIA) suspension 30 mL  30 mL Oral Daily  PRN Ajibola, Ene A, NP       traZODone  (DESYREL ) tablet 50 mg  50 mg Oral QHS PRN Ajibola, Ene A, NP   50 mg at 07/29/23 2025   Current Outpatient Medications  Medication Sig Dispense Refill   FLUoxetine  (PROZAC ) 20 MG capsule Take 1 capsule (20 mg total) by mouth daily. 30 capsule 0   hydrOXYzine  (ATARAX ) 25 MG tablet Take 1 tablet (25 mg total) by mouth 3 (three) times daily as needed for anxiety. 30 tablet 0   traZODone  (DESYREL ) 150 MG tablet Take 1 tablet (150 mg total) by mouth at bedtime as needed for sleep. 30 tablet 0   [START ON 08/07/2023] paliperidone  (INVEGA  SUSTENNA) 156 MG/ML SUSY injection Inject 1 mL (156 mg total) into the muscle every 28 (twenty-eight) days. Patient last received on 07/10/23 and is due for next LAI maintenance dose of 156 mg on 08/07/23 1 mL 2   polyethylene glycol (MIRALAX  / GLYCOLAX ) 17 g packet Take 17 g by mouth daily. (Patient not taking: Reported on 07/28/2023) 30 packet 0    PTA Medications:  Facility Ordered Medications  Medication   acetaminophen  (TYLENOL ) tablet 650 mg   alum & mag hydroxide-simeth (MAALOX/MYLANTA) 200-200-20 MG/5ML suspension 30 mL   magnesium  hydroxide (MILK OF MAGNESIA) suspension 30 mL   hydrOXYzine  (ATARAX ) tablet 25 mg   traZODone  (DESYREL ) tablet 50 mg   haloperidol  (HALDOL ) tablet 5 mg   And   diphenhydrAMINE  (BENADRYL ) capsule 50 mg   haloperidol  lactate (HALDOL ) injection 5 mg   And   diphenhydrAMINE  (BENADRYL ) injection 50 mg   And   LORazepam  (ATIVAN ) injection 2 mg   haloperidol  lactate (HALDOL ) injection 10 mg   And   diphenhydrAMINE  (BENADRYL ) injection 50 mg   And   LORazepam  (ATIVAN ) injection 2 mg   PTA Medications  Medication Sig   hydrOXYzine  (ATARAX ) 25 MG tablet Take 1 tablet (25 mg total) by mouth 3 (three) times daily as needed for anxiety.   traZODone  (DESYREL ) 150 MG tablet Take 1 tablet (150 mg total) by mouth at bedtime as needed for sleep.   FLUoxetine  (PROZAC ) 20 MG capsule Take 1 capsule  (20 mg total) by mouth daily.   [START ON 08/07/2023] paliperidone  (INVEGA  SUSTENNA) 156 MG/ML SUSY injection Inject 1 mL (156 mg total) into the muscle every 28 (twenty-eight) days. Patient last received on 07/10/23 and is due for next LAI maintenance dose of 156 mg on 08/07/23   polyethylene glycol (MIRALAX  / GLYCOLAX ) 17 g packet Take 17 g by mouth daily. (Patient not taking: Reported on 07/28/2023)       05/23/2023   10:48 AM 03/07/2023    4:33 PM 01/22/2023    3:41 PM  Depression screen PHQ 2/9  Decreased Interest 0 2 2  Down, Depressed, Hopeless 0 2 2  PHQ - 2 Score 0 4 4  Altered sleeping 0 2 2  Tired, decreased energy 0 2 2  Change in appetite 0 2 2  Feeling bad or failure about yourself  0 2 2  Trouble concentrating 1 0 2  Moving slowly or fidgety/restless 0 0 0  Suicidal thoughts 0 0 0  PHQ-9 Score 1 12 14   Difficult doing work/chores Not difficult at all Very difficult Somewhat difficult    Flowsheet Row ED from [facility] in [facility] from 07/12/2023 in [facility] (Discharged[facility]  C-SSRS RISK CATEGORY Low Risk No Risk No Risk       Musculoskeletal  Strength & Muscle Tone: within normal limits Gait & Station: normal Patient leans: Right  Psychiatric Specialty Exam  Presentation  General Appearance:  Casual  Eye Contact: Fair  Speech: Clear and Coherent; Normal Rate  Speech Volume: Normal  Handedness: Right   Mood and Affect  Mood: Anxious; Depressed  Affect: Congruent   Thought Process  Thought Processes: Coherent  Descriptions of Associations:Intact  Orientation:Full (Time, Place and Person)  Thought Content:Logical  Diagnosis of Schizophrenia or Schizoaffective disorder in past: Yes  Duration of Psychotic Symptoms: Greater than six months   Hallucinations:Hallucinations: Auditory Description of Auditory  Hallucinations: womans voice that I talk to - not scary  Ideas of Reference:None  Suicidal Thoughts:Suicidal Thoughts: Yes, Passive SI Passive Intent and/or Plan: With Intent; Without Plan  Homicidal Thoughts:Homicidal Thoughts: No   Sensorium  Memory: Immediate Fair; Recent Fair; Remote Fair  Judgment: Poor  Insight: Poor   Executive Functions  Concentration: Fair  Attention Span: Fair  Recall: [employer] of Knowledge: Fair  Language: Fair   Psychomotor Activity  Psychomotor Activity: Psychomotor Activity: Normal   Assets  Assets: Manufacturing systems engineer; Desire for Improvement; Physical Health; Resilience   Sleep  Sleep: Sleep: Fair   No data recorded  Physical Exam  Physical Exam Constitutional:      Appearance: Normal appearance.  Eyes:     General:        Right eye: No discharge.        Left eye: No discharge.  Cardiovascular:     Rate and Rhythm: Normal rate.  Pulmonary:     Effort: Pulmonary effort is normal.  Musculoskeletal:        General: Normal range of motion.  Neurological:     Mental Status: She is alert and oriented to person, place, and time.  Psychiatric:        Mood and Affect: Mood is depressed.        Speech: Speech is delayed (thought blocking with pauses).        Behavior: Behavior is withdrawn. Behavior is cooperative.        Thought Content: Thought content includes suicidal ideation. Thought content includes suicidal plan.        Cognition and Memory: Cognition normal.        Judgment: Judgment is impulsive.    Review of Systems  Constitutional:  Negative for chills and fever.  Respiratory:  Negative for cough and shortness of breath.   Cardiovascular:  Negative for chest pain.  Musculoskeletal: Negative.   Neurological:  Negative for tremors.  Psychiatric/Behavioral:  Positive for depression, hallucinations and suicidal ideas.    Blood pressure 114/84, pulse 88, temperature 97.9 F (36.6 C), temperature  source Oral, resp. rate 18, last menstrual period 06/01/2023, SpO2 100%, unknown if currently breastfeeding. There is no height or weight on file to  calculate BMI.  Disposition:   Patient recommended for inpatient psychiatric admission has been accepted to old [facility].  Medications: Patient was not started on scheduled medications while at facility.  Costella Dirks, NP 07/30/2023, 10:59 AM

## 2023-07-30 NOTE — ED Notes (Signed)
 Patient observed/assessed in bed/chair resting quietly appearing in no distress and verbalizing no complaints at this time. Will continue to monitor.

## 2023-08-15 ENCOUNTER — Emergency Department (HOSPITAL_COMMUNITY)
Admission: EM | Admit: 2023-08-15 | Discharge: 2023-08-16 | Disposition: A | Discharge status: Other Acute Inpatient Hospital | Attending: Emergency Medicine | Admitting: Emergency Medicine

## 2023-08-15 DIAGNOSIS — F319 Bipolar disorder, unspecified: Secondary | ICD-10-CM | POA: Diagnosis not present

## 2023-08-15 DIAGNOSIS — Z79899 Other long term (current) drug therapy: Secondary | ICD-10-CM | POA: Diagnosis not present

## 2023-08-15 DIAGNOSIS — F209 Schizophrenia, unspecified: Secondary | ICD-10-CM | POA: Insufficient documentation

## 2023-08-15 DIAGNOSIS — F12959 Cannabis use, unspecified with psychotic disorder, unspecified: Secondary | ICD-10-CM | POA: Insufficient documentation

## 2023-08-15 DIAGNOSIS — F419 Anxiety disorder, unspecified: Secondary | ICD-10-CM | POA: Diagnosis not present

## 2023-08-15 DIAGNOSIS — R45851 Suicidal ideations: Secondary | ICD-10-CM | POA: Diagnosis not present

## 2023-08-15 DIAGNOSIS — R4689 Other symptoms and signs involving appearance and behavior: Secondary | ICD-10-CM | POA: Diagnosis present

## 2023-08-15 DIAGNOSIS — F32A Depression, unspecified: Secondary | ICD-10-CM | POA: Insufficient documentation

## 2023-08-15 DIAGNOSIS — F12259 Cannabis dependence with psychotic disorder, unspecified: Secondary | ICD-10-CM | POA: Diagnosis present

## 2023-08-15 DIAGNOSIS — F203 Undifferentiated schizophrenia: Secondary | ICD-10-CM | POA: Diagnosis not present

## 2023-08-15 LAB — RAPID URINE DRUG SCREEN, HOSP PERFORMED
Amphetamines: NOT DETECTED
Barbiturates: NOT DETECTED
Benzodiazepines: NOT DETECTED
Cocaine: NOT DETECTED
Opiates: NOT DETECTED
Tetrahydrocannabinol: POSITIVE — AB

## 2023-08-15 LAB — COMPREHENSIVE METABOLIC PANEL WITH GFR
ALT: 18 U/L (ref 0–44)
AST: 20 U/L (ref 15–41)
Albumin: 3.8 g/dL (ref 3.5–5.0)
Alkaline Phosphatase: 116 U/L (ref 38–126)
Anion gap: 9 (ref 5–15)
BUN: 8 mg/dL (ref 6–20)
CO2: 24 mmol/L (ref 22–32)
Calcium: 9.6 mg/dL (ref 8.9–10.3)
Chloride: 104 mmol/L (ref 98–111)
Creatinine, Ser: 0.85 mg/dL (ref 0.44–1.00)
GFR, Estimated: >60 mL/min (ref >60)
Glucose, Bld: 102 mg/dL — ABNORMAL HIGH (ref 70–99)
Potassium: 3.9 mmol/L (ref 3.5–5.1)
Sodium: 137 mmol/L (ref 135–145)
Total Bilirubin: 0.6 mg/dL (ref 0.0–1.2)
Total Protein: 8.1 g/dL (ref 6.5–8.1)

## 2023-08-15 LAB — CBC WITH DIFFERENTIAL/PLATELET
Abs Immature Granulocytes: 0.01 10*3/uL (ref 0.00–0.07)
Basophils Absolute: 0.1 10*3/uL (ref 0.0–0.1)
Basophils Relative: 1 %
Eosinophils Absolute: 0.2 10*3/uL (ref 0.0–0.5)
Eosinophils Relative: 3 %
HCT: 41.8 % (ref 36.0–46.0)
Hemoglobin: 12.9 g/dL (ref 12.0–15.0)
Immature Granulocytes: 0 %
Lymphocytes Relative: 28 %
Lymphs Abs: 2 10*3/uL (ref 0.7–4.0)
MCH: 25.7 pg — ABNORMAL LOW (ref 26.0–34.0)
MCHC: 30.9 g/dL (ref 30.0–36.0)
MCV: 83.4 fL (ref 80.0–100.0)
Monocytes Absolute: 0.7 10*3/uL (ref 0.1–1.0)
Monocytes Relative: 9 %
Neutro Abs: 4.1 10*3/uL (ref 1.7–7.7)
Neutrophils Relative %: 59 %
Platelets: 360 10*3/uL (ref 150–400)
RBC: 5.01 MIL/uL (ref 3.87–5.11)
RDW: 14.3 % (ref 11.5–15.5)
WBC: 7.1 10*3/uL (ref 4.0–10.5)
nRBC: 0 % (ref 0.0–0.2)

## 2023-08-15 LAB — HCG, SERUM, QUALITATIVE: Preg, Serum: NEGATIVE

## 2023-08-15 LAB — ETHANOL: Alcohol, Ethyl (B): <15 mg/dL (ref <15)

## 2023-08-15 MED ORDER — NICOTINE 21 MG/24HR TD PT24
21.0000 mg | MEDICATED_PATCH | Freq: Every day | TRANSDERMAL | Status: DC
  Administered 2023-08-15 – 2023-08-16 (×2): 21 mg via TRANSDERMAL
  Filled 2023-08-15 (×2): qty 1

## 2023-08-15 MED ORDER — ACETAMINOPHEN 325 MG PO TABS: 650.0000 mg | ORAL_TABLET | ORAL | Status: DC | PRN

## 2023-08-15 MED ORDER — RISPERIDONE 0.5 MG PO TBDP
2.0000 mg | ORAL_TABLET | Freq: Three times a day (TID) | ORAL | Status: DC | PRN
  Administered 2023-08-15: 2 mg via ORAL
  Filled 2023-08-15: qty 4

## 2023-08-15 MED ORDER — LORAZEPAM 1 MG PO TABS
1.0000 mg | ORAL_TABLET | ORAL | Status: AC | PRN
  Administered 2023-08-15: 1 mg via ORAL
  Filled 2023-08-15: qty 1

## 2023-08-15 MED ORDER — ZOLPIDEM TARTRATE 5 MG PO TABS
5.0000 mg | ORAL_TABLET | Freq: Every evening | ORAL | Status: DC | PRN
  Administered 2023-08-15: 5 mg via ORAL
  Filled 2023-08-15: qty 1

## 2023-08-15 MED ORDER — ONDANSETRON HCL 4 MG PO TABS: 4.0000 mg | ORAL_TABLET | Freq: Three times a day (TID) | ORAL | Status: DC | PRN

## 2023-08-15 MED ORDER — ZIPRASIDONE MESYLATE 20 MG IM SOLR: 20.0000 mg | INTRAMUSCULAR | Status: DC | PRN

## 2023-08-15 MED ORDER — ALUM & MAG HYDROXIDE-SIMETH 200-200-20 MG/5ML PO SUSP: 30.0000 mL | Freq: Four times a day (QID) | ORAL | Status: DC | PRN

## 2023-08-15 NOTE — ED Provider Notes (Signed)
 [facility]PARTMENT AT [facility] Provider Note   CSN: [id number] Arrival date & time: 08/15/23  1628     History  No chief complaint on file.   Carly Brooks is a 28 y.o. female.  The history is provided by the patient and medical records. No language interpreter was used.     28 year old female history of presenting disorder, schizophrenia, bipolar, depression, anxiety, brought here via EMS with complaints of suicidal ideation patient report for the past week she has had both auditory and visual hallucination with command hallucination.  She is having suicidal thoughts without any specific plan.  She attributed to "life".  She denies having homicidal ideation.  She denies self-medicating with drugs or alcohol.  States she is sleeping less and eating more.  She is here requesting for help.  Denies any recent medication changes.  mention last menstruation was a month ago.  Home Medications Prior to Admission medications   Medication Sig Start Date End Date Taking? Authorizing Provider  FLUoxetine  (PROZAC ) 20 MG capsule Take 1 capsule (20 mg total) by mouth daily. 07/13/23   Zouev, Dmitri, MD  hydrOXYzine  (ATARAX ) 25 MG tablet Take 1 tablet (25 mg total) by mouth 3 (three) times daily as needed for anxiety. 07/12/23   Zouev, Dmitri, MD  paliperidone  (INVEGA  SUSTENNA) 156 MG/ML SUSY injection Inject 1 mL (156 mg total) into the muscle every 28 (twenty-eight) days. Patient last received on 07/10/23 and is due for next LAI maintenance dose of 156 mg on 08/07/23 08/07/23   Zouev, Dmitri, MD  polyethylene glycol (MIRALAX  / GLYCOLAX ) 17 g packet Take 17 g by mouth daily. Patient not taking: Reported on 07/28/2023 07/12/23   Zouev, Dmitri, MD  traZODone  (DESYREL ) 150 MG tablet Take 1 tablet (150 mg total) by mouth at bedtime as needed for sleep. 07/12/23   Zouev, Dmitri, MD      Allergies    Patient has no known allergies.    Review of Systems   Review of Systems   All other systems reviewed and are negative.   Physical Exam Updated Vital Signs BP 132/85 (BP Location: Left Arm)   Pulse (!) 116   Temp 98.3 F (36.8 C) (Oral)   Resp 18   Ht 5\' 9"  (1.753 m)   Wt 120 kg   SpO2 97%   BMI 39.07 kg/m  Physical Exam Vitals and nursing note reviewed.  Constitutional:      General: She is not in acute distress.    Appearance: She is well-developed.  HENT:     Head: Atraumatic.  Eyes:     Conjunctiva/sclera: Conjunctivae normal.  Cardiovascular:     Rate and Rhythm: Normal rate and regular rhythm.     Pulses: Normal pulses.     Heart sounds: Normal heart sounds.  Pulmonary:     Effort: Pulmonary effort is normal.  Abdominal:     Palpations: Abdomen is soft.     Tenderness: There is no abdominal tenderness.  Musculoskeletal:        General: Normal range of motion.     Cervical back: Neck supple.  Skin:    Findings: No rash.  Neurological:     Mental Status: She is alert and oriented to person, place, and time.     GCS: GCS eye subscore is 4. GCS verbal subscore is 5. GCS motor subscore is 6.  Psychiatric:        Mood and Affect: Mood normal.  Speech: Speech normal.        Behavior: Behavior is cooperative.        Thought Content: Thought content includes suicidal ideation. Thought content does not include homicidal ideation.     ED Results / Procedures / Treatments   Labs (all labs ordered are listed, but only abnormal results are displayed) Labs Reviewed  COMPREHENSIVE METABOLIC PANEL WITH GFR - Abnormal; Notable for the following components:      Result Value   Glucose, Bld 102 (*)    All other components within normal limits  RAPID URINE DRUG SCREEN, HOSP PERFORMED - Abnormal; Notable for the following components:   Tetrahydrocannabinol POSITIVE (*)    All other components within normal limits  CBC WITH DIFFERENTIAL/PLATELET - Abnormal; Notable for the following components:   MCH 25.7 (*)    All other components  within normal limits  ETHANOL  HCG, SERUM, QUALITATIVE    EKG None  Radiology No results found.  Procedures Procedures    Medications Ordered in ED Medications  risperiDONE  (RISPERDAL  M-TABS) disintegrating tablet 2 mg (has no administration in time range)    And  LORazepam  (ATIVAN ) tablet 1 mg (has no administration in time range)    And  ziprasidone (GEODON) injection 20 mg (has no administration in time range)  acetaminophen  (TYLENOL ) tablet 650 mg (has no administration in time range)  zolpidem  (AMBIEN ) tablet 5 mg (has no administration in time range)  ondansetron  (ZOFRAN ) tablet 4 mg (has no administration in time range)  alum & mag hydroxide-simeth (MAALOX/MYLANTA) 200-200-20 MG/5ML suspension 30 mL (has no administration in time range)  nicotine (NICODERM CQ - dosed in mg/24 hours) patch 21 mg (21 mg Transdermal Patch Applied 08/15/23 2010)    ED Course/ Medical Decision Making/ A&P                                 Medical Decision Making Amount and/or Complexity of Data Reviewed Labs: ordered.  Risk OTC drugs. Prescription drug management.   BP 132/85 (BP Location: Left Arm)   Pulse (!) 116   Temp 98.3 F (36.8 C) (Oral)   Resp 18   Ht 5\' 9"  (1.753 m)   Wt 120 kg   SpO2 97%   BMI 39.07 kg/m   29:5 PM  28 year old female history of presenting disorder, schizophrenia, bipolar, depression, anxiety, brought here via EMS with complaints of suicidal ideation patient report for the past week she has had both auditory and visual hallucination with command hallucination.  She is having suicidal thoughts without any specific plan.  She attributed to "life".  She denies having homicidal ideation.  She denies self-medicating with drugs or alcohol.  States she is sleeping less and eating more.  She is here requesting for help.  Denies any recent medication changes.  mention last menstruation was a month ago.  On exam, patient is laying in a prone position.  She does  not make eye contact.  She does not appear to be in any acute distress.  -Labs ordered, independently viewed and interpreted by me.  Labs remarkable for UDS positive for [facility] -The patient was maintained on a cardiac monitor.  I personally viewed and interpreted the cardiac monitored which showed an underlying rhythm of: Sinus tachycardia -Imaging not considered -This patient presents to the ED for concern of suicidal ideation, this involves an extensive number of treatment options, and is a complaint that carries with  it a high risk of complications and morbidity.  The differential diagnosis includes schizophrenia, bipolar, depression, anxiety, metabolic derangement -Co morbidities that complicate the patient evaluation includes COPD, bipolar, depression, anxiety -Treatment includes supportive care -Reevaluation of the patient after these medicines showed that the patient improved -PCP office notes or outside notes reviewed -TTS and psychiatry consult have been placed -Escalation to admission/observation considered: patient is here voluntarily for SI  Pt is medically cleared         Final Clinical Impression(s) / ED Diagnoses Final diagnoses:  Suicidal ideation    Rx / DC Orders ED Discharge Orders     None         Debbra Fairy, PA-C 08/15/23 2028    Dalene Duck, MD 08/15/23 [phone]

## 2023-08-15 NOTE — ED Triage Notes (Signed)
 Patient BIB EMS from home for Suicidal Ideation without a plan. Patient reported to EMS that she has been suicidal since last Thursday 08/09/23.
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
## 2023-08-16 ENCOUNTER — Inpatient Hospital Stay
Admission: AD | Admit: 2023-08-16 | Discharge: 2023-08-26 | DRG: 885 | Disposition: A | Discharge status: Home / Self Care | Source: Intra-hospital | Attending: Psychiatry | Admitting: Psychiatry

## 2023-08-16 DIAGNOSIS — R45851 Suicidal ideations: Secondary | ICD-10-CM | POA: Diagnosis present

## 2023-08-16 DIAGNOSIS — Z818 Family history of other mental and behavioral disorders: Secondary | ICD-10-CM | POA: Diagnosis not present

## 2023-08-16 DIAGNOSIS — Z79899 Other long term (current) drug therapy: Secondary | ICD-10-CM

## 2023-08-16 DIAGNOSIS — F6089 Other specific personality disorders: Secondary | ICD-10-CM | POA: Diagnosis present

## 2023-08-16 DIAGNOSIS — Z5941 Food insecurity: Secondary | ICD-10-CM | POA: Diagnosis not present

## 2023-08-16 DIAGNOSIS — F600 Paranoid personality disorder: Secondary | ICD-10-CM | POA: Diagnosis present

## 2023-08-16 DIAGNOSIS — F203 Undifferentiated schizophrenia: Secondary | ICD-10-CM

## 2023-08-16 DIAGNOSIS — Z5982 Transportation insecurity: Secondary | ICD-10-CM | POA: Diagnosis not present

## 2023-08-16 DIAGNOSIS — F411 Generalized anxiety disorder: Secondary | ICD-10-CM | POA: Diagnosis present

## 2023-08-16 DIAGNOSIS — F209 Schizophrenia, unspecified: Secondary | ICD-10-CM | POA: Diagnosis present

## 2023-08-16 DIAGNOSIS — Z5948 Other specified lack of adequate food: Secondary | ICD-10-CM

## 2023-08-16 DIAGNOSIS — Z5986 Financial insecurity: Secondary | ICD-10-CM

## 2023-08-16 DIAGNOSIS — Z833 Family history of diabetes mellitus: Secondary | ICD-10-CM

## 2023-08-16 DIAGNOSIS — F200 Paranoid schizophrenia: Secondary | ICD-10-CM | POA: Diagnosis not present

## 2023-08-16 MED ORDER — OLANZAPINE 10 MG PO TABS
10.0000 mg | ORAL_TABLET | Freq: Every day | ORAL | Status: DC
  Administered 2023-08-17: 10 mg via ORAL
  Filled 2023-08-16: qty 1

## 2023-08-16 MED ORDER — LORAZEPAM 2 MG/ML IJ SOLN: 2.0000 mg | Freq: Three times a day (TID) | INTRAMUSCULAR | Status: DC | PRN

## 2023-08-16 MED ORDER — NICOTINE 21 MG/24HR TD PT24
21.0000 mg | MEDICATED_PATCH | Freq: Every day | TRANSDERMAL | Status: DC
  Administered 2023-08-17 – 2023-08-25 (×8): 21 mg via TRANSDERMAL
  Filled 2023-08-16 (×10): qty 1

## 2023-08-16 MED ORDER — DIPHENHYDRAMINE HCL 50 MG/ML IJ SOLN: 50.0000 mg | Freq: Three times a day (TID) | INTRAMUSCULAR | Status: DC | PRN

## 2023-08-16 MED ORDER — TRAZODONE HCL 50 MG PO TABS: 50.0000 mg | ORAL_TABLET | Freq: Every evening | ORAL | Status: DC | PRN

## 2023-08-16 MED ORDER — FLUOXETINE HCL 20 MG PO CAPS
20.0000 mg | ORAL_CAPSULE | Freq: Every day | ORAL | Status: DC
  Administered 2023-08-17 – 2023-08-24 (×8): 20 mg via ORAL
  Filled 2023-08-16 (×8): qty 1

## 2023-08-16 MED ORDER — ALUM & MAG HYDROXIDE-SIMETH 200-200-20 MG/5ML PO SUSP: 30.0000 mL | ORAL | Status: DC | PRN

## 2023-08-16 MED ORDER — HYDROXYZINE HCL 25 MG PO TABS: 25.0000 mg | ORAL_TABLET | Freq: Three times a day (TID) | ORAL | Status: DC | PRN

## 2023-08-16 MED ORDER — MAGNESIUM HYDROXIDE 400 MG/5ML PO SUSP: 30.0000 mL | Freq: Every day | ORAL | Status: DC | PRN

## 2023-08-16 MED ORDER — TRAZODONE HCL 50 MG PO TABS
50.0000 mg | ORAL_TABLET | Freq: Every evening | ORAL | Status: DC | PRN
  Administered 2023-08-17 – 2023-08-25 (×7): 50 mg via ORAL
  Filled 2023-08-16 (×7): qty 1

## 2023-08-16 MED ORDER — DIPHENHYDRAMINE HCL 25 MG PO CAPS
50.0000 mg | ORAL_CAPSULE | Freq: Three times a day (TID) | ORAL | Status: DC | PRN
  Administered 2023-08-17 – 2023-08-21 (×2): 50 mg via ORAL
  Filled 2023-08-16 (×2): qty 2

## 2023-08-16 MED ORDER — RISPERIDONE 1 MG PO TABS
3.0000 mg | ORAL_TABLET | Freq: Two times a day (BID) | ORAL | Status: DC
  Administered 2023-08-17: 3 mg via ORAL
  Filled 2023-08-16: qty 3

## 2023-08-16 MED ORDER — HALOPERIDOL 5 MG PO TABS
5.0000 mg | ORAL_TABLET | Freq: Three times a day (TID) | ORAL | Status: DC | PRN
  Administered 2023-08-17 – 2023-08-21 (×2): 5 mg via ORAL
  Filled 2023-08-16 (×2): qty 1

## 2023-08-16 MED ORDER — HALOPERIDOL LACTATE 5 MG/ML IJ SOLN: 10.0000 mg | Freq: Three times a day (TID) | INTRAMUSCULAR | Status: DC | PRN

## 2023-08-16 MED ORDER — HYDROXYZINE HCL 25 MG PO TABS
25.0000 mg | ORAL_TABLET | Freq: Three times a day (TID) | ORAL | Status: DC | PRN
  Administered 2023-08-17 – 2023-08-24 (×7): 25 mg via ORAL
  Filled 2023-08-16 (×7): qty 1

## 2023-08-16 MED ORDER — ACETAMINOPHEN 325 MG PO TABS: 650.0000 mg | ORAL_TABLET | Freq: Four times a day (QID) | ORAL | Status: DC | PRN

## 2023-08-16 MED ORDER — OLANZAPINE 10 MG PO TABS
10.0000 mg | ORAL_TABLET | Freq: Every day | ORAL | Status: DC
  Administered 2023-08-16: 10 mg via ORAL
  Filled 2023-08-16: qty 1

## 2023-08-16 MED ORDER — HALOPERIDOL LACTATE 5 MG/ML IJ SOLN: 5.0000 mg | Freq: Three times a day (TID) | INTRAMUSCULAR | Status: DC | PRN

## 2023-08-16 MED ORDER — RISPERIDONE 2 MG PO TABS
3.0000 mg | ORAL_TABLET | Freq: Two times a day (BID) | ORAL | Status: DC
  Administered 2023-08-16 (×2): 3 mg via ORAL
  Filled 2023-08-16 (×2): qty 1

## 2023-08-16 MED ORDER — FLUOXETINE HCL 20 MG PO CAPS
20.0000 mg | ORAL_CAPSULE | Freq: Every day | ORAL | Status: DC
  Administered 2023-08-16: 20 mg via ORAL
  Filled 2023-08-16: qty 1

## 2023-08-16 NOTE — Consult Note (Signed)
 [facility] Psychiatric Consult Initial  Patient Name: .Carly Brooks  MRN: [id number]  DOB: 1995-10-12  Consult Order details:  Orders (From admission, onward)     Start     Ordered   08/15/23 1948  CONSULT TO CALL ACT TEAM       Ordering Provider: Debbra Fairy, PA-C  Provider:  (Not yet assigned)  Question:  Reason for Consult?  Answer:  Psych consult   08/15/23 1948             Mode of Visit: In person    Psychiatry Consult Evaluation  Service Date: Aug 16, 2023 LOS:  LOS: 0 days  Chief Complaint  Suicidal Ideation without a plan   Primary Psychiatric Diagnoses  Schizophrenia 2.   Depression 3.   Cannabis induced psychotic disorder   Assessment  Carly Brooks is a 28 y.o. female admitted: Presented to the ED on 08/15/2023  4:28 PM for suicidal Ideation without a plan. She carries the psychiatric diagnoses of schizophrenia, and depression and has a past medical history of anemia.   Her current presentation of passive suicidal ideation and auditory hallucinations, is most consistent with decompensating mental illness. She meets criteria for inpatient psychiatric admission based on current presentation. Current outpatient psychotropic medications include Prozac , Zyprexa  and Risperdal  and historically she has had a beneficial response to these medications. She was noncompliant with medications prior to admission as evidenced by patient report. On initial examination, patient bizarre, but cooperative. 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 Patient is under voluntary admission status at this time; please IVC if attempts to leave hospital.     ## Behavioral / Environmental: -To minimize splitting of staff, assign one staff person to communicate all information from the team when feasible. or Utilize compassion and acknowledge the patient's experiences while setting clear and realistic expectations for care.                ## Safety and Observation Level:  - Based on my clinical evaluation, I estimate the patient to be at low risk of self harm in the current setting. - At this time, we recommend  routine. This decision is based on my review of the chart including patient's history and current presentation, interview of the patient, mental status examination, and consideration of suicide risk including evaluating suicidal ideation, plan, intent, suicidal or self-harm behaviors, risk factors, and protective factors. This judgment is based on our ability to directly address suicide risk, implement suicide prevention strategies, and develop a safety plan while the patient is in the clinical setting. Please contact our team if there is a concern that risk level has changed.   CSSR Risk Category:C-SSRS RISK CATEGORY: No Risk   Suicide Risk Assessment: Patient has following modifiable risk factors for suicide: recklessness, medication noncompliance and social isolation, which we are addressing by recommending inpatient psychiatric hospitalization.. Patient has following non-modifiable or demographic risk  factors for suicide: separation or divorce and psychiatric hospitalization Patient has the following protective factors against suicide: Supportive family and Supportive friends   Thank you for this consult request. Recommendations have been  communicated to the primary team.  We will recommend inpatient psychiatric hospitalization at this time.   Chandra Come, PMHNP       History of Present Illness  Relevant Aspects of Hospital ED Course:  Admitted on 08/15/2023 for suicidal ideation patient and auditory and visual hallucination with command hallucination.   Patient Report:  Carly Brooks, 28 y.o., female patient seen face to face by this provider, consulted with Dr. Deborah Falling; and chart reviewed on 08/16/23.  On evaluation Carly Brooks patient is laying in bed, with eyes closed. She is bizarre and makes minimal eye contact.  She is withdrawn.  She pauses often when answering questions and appears to be having some thought blocking.  She continues to endorse depression and identifies being a mother as one of them.  Reports she has a 39 and 72-year-old children at home and it is difficult, she has minimal support, says they are currently with her significant other [name]).  She continues to endorse suicidal ideations without any specific plan.  She denies homicidal ideations.  She endorses auditory hallucinations, but unable to elaborate.  She denies visual hallucinations.  She currently does not appear to be responding to internal/external stimuli. Discussed inpatient psychiatric admission and patient continues to be in agreement.  After assessment, and I was standing at the nurses desk, talking with the RN, patient was observed walking in the hallway without any clothes. She was redirected to the assessment room and provided clothing.   Psych ROS:  Depression: Endorses Anxiety:   Endorses Mania (lifetime and current): Lifetime  Psychosis: (lifetime and current): Yes  Collateral information:  Contacted patient Godmother Fairy Homer, she states her goddaughter is "going through something" she states she is not the goddaughter she knows, says she recently called her and patient was telling her she  was married and had more children. She states that after patient had her last child, she thinks she had postpartum, and did not mentally take care of herself, did not receive any therapy.   Attempted to call patient significant other [name]), no answer.   ROS   Psychiatric and Social History  Psychiatric History:  Information collected from patient chart and Mar[facility]a   Prev Dx/Sx: Schizophrenia  Current Psych Provider: Denies  Home Meds (current): see above  Previous Med Trials: Yes Therapy: Denies   Prior Psych Hospitalization: Yes  Prior Self Harm: Yes Prior Violence: Denies   Family Psych History: Yes Family Hx suicide: Denies   Social History:  Developmental Hx: deferred Educational Hx: graduated high school  Occupational Hx: Unemployed  Legal Hx: Denies  Living Situation: Lives with significant other Child psychotherapist) Spiritual Hx: Yes Access to weapons/lethal means: Denies    Substance History Alcohol: Yes  Type of alcohol Unknown  Last Drink Unknown  Number of drinks per day Unknown  History of alcohol withdrawal seizures Unknown  History of DT's Unknown  Tobacco: Denies  Illicit drugs: Yes Prescription drug abuse: Denies  Rehab hx: Denies   Exam Findings  Physical Exam:  Vital Signs:  Temp:  [98 F (36.7 C)-98.3 F (36.8 C)] 98 F (36.7 C) (05/29 0639) Pulse Rate:  [87-116] 89 (05/29 0639) Resp:  [16-18] 16 (05/29 0639) BP: (127-132)/(82-87) 127/82 (05/29 0639) SpO2:  [97 %-100 %] 100 % (05/29  1191) Weight:  [120 kg] 120 kg (05/28 1644) Blood pressure 127/82, pulse 89, temperature 98 F (36.7 C), resp. rate 16, height 5\' 9"  (1.753 m), weight 120 kg, SpO2 100%, unknown if currently breastfeeding. Body mass index is 39.07 kg/m.  Physical Exam  Mental Status Exam: General Appearance: Bizarre  Orientation:  self, environment   Memory:  Immediate;   Fair Remote;   Poor  Concentration:  Concentration: Poor and Attention Span: Poor  Recall:   Fair  Attention  Poor  Eye Contact:  Minimal  Speech:  Clear and Coherent  Language:  Fair  Volume:  Decreased  Mood: flat  Affect:  Flat and Restricted  Thought Process:  Disorganized  Thought Content:  Illogical and Hallucinations: Auditory  Suicidal Thoughts:  No  Homicidal Thoughts:  No  Judgement:  Impaired  Insight:  Lacking  Psychomotor Activity:  Normal  Akathisia:  NA  Fund of Knowledge:  Poor      Assets:  Communication Skills Desire for Improvement Housing Social Support  Cognition:  Impaired,  Mild  ADL's:  Impaired  AIMS (if indicated):        Other History   These have been pulled in through the EMR, reviewed, and updated if appropriate.  Family History:  The patient's family history includes Diabetes in her mother.  Medical History: Past Medical History:  Diagnosis Date   Anxiety    Cluster B personality disorder (HCC)    Depression    Medical history non-contributory     Surgical History: Past Surgical History:  Procedure Laterality Date   CESAREAN SECTION     CESAREAN SECTION  04/18/2021   CESAREAN SECTION WITH BILATERAL TUBAL LIGATION N/A 04/18/2021   Procedure: CESAREAN SECTION     Medications:   Current Facility-Administered Medications:    acetaminophen  (TYLENOL ) tablet 650 mg, 650 mg, Oral, Q4H PRN, Tran, Bowie, PA-C   alum & mag hydroxide-simeth (MAALOX/MYLANTA) 200-200-20 MG/5ML suspension 30 mL, 30 mL, Oral, Q6H PRN, Tran, Bowie, PA-C   FLUoxetine  (PROZAC ) capsule 20 mg, 20 mg, Oral, Daily, Trifan, Janalyn Me, MD, 20 mg at 08/16/23 4782   hydrOXYzine  (ATARAX ) tablet 25 mg, 25 mg, Oral, Q8H PRN, Trifan, Janalyn Me, MD   nicotine  (NICODERM CQ  - dosed in mg/24 hours) patch 21 mg, 21 mg, Transdermal, Daily, Tran, Bowie, PA-C, 21 mg at 08/16/23 0907   OLANZapine  (ZYPREXA ) tablet 10 mg, 10 mg, Oral, QHS, Trifan, Janalyn Me, MD   ondansetron  (ZOFRAN ) tablet 4 mg, 4 mg, Oral, Q8H PRN, Tran, Bowie, PA-C   risperiDONE  (RISPERDAL ) tablet 3 mg,  3 mg, Oral, BID, Trifan, Janalyn Me, MD, 3 mg at 08/16/23 9562   traZODone  (DESYREL ) tablet 50 mg, 50 mg, Oral, QHS PRN, Trifan, Matthew J, MD  Current Outpatient Medications:    ARIPiprazole  (ABILIFY ) 20 MG tablet, Take 20 mg by mouth daily. (Patient not taking: Reported on 08/16/2023), Disp: , Rfl:    famotidine (PEPCID) 20 MG tablet, Take 20 mg by mouth daily as needed for heartburn. (Patient not taking: Reported on 08/16/2023), Disp: , Rfl:    FLUoxetine  (PROZAC ) 20 MG capsule, Take 1 capsule (20 mg total) by mouth daily. (Patient not taking: Reported on 08/16/2023), Disp: 30 capsule, Rfl: 0   hydrOXYzine  (VISTARIL ) 25 MG capsule, Take 25 mg by mouth every 6 (six) hours as needed. (Patient not taking: Reported on 08/16/2023), Disp: , Rfl:    OLANZapine  (ZYPREXA ) 10 MG tablet, Take 10 mg by mouth at bedtime. (Patient not taking: Reported on  08/16/2023), Disp: , Rfl:    paliperidone  (INVEGA  SUSTENNA) 156 MG/ML SUSY injection, Inject 1 mL (156 mg total) into the muscle every 28 (twenty-eight) days. Patient last received on 07/10/23 and is due for next LAI maintenance dose of 156 mg on 08/07/23 (Patient not taking: Reported on 08/16/2023), Disp: 1 mL, Rfl: 2   QUEtiapine  (SEROQUEL ) 200 MG tablet, Take 200 mg by mouth at bedtime. (Patient not taking: Reported on 08/16/2023), Disp: , Rfl:    risperiDONE  (RISPERDAL ) 3 MG tablet, Take 3 mg by mouth 2 (two) times daily. (Patient not taking: Reported on 08/16/2023), Disp: , Rfl:    traZODone  (DESYREL ) 50 MG tablet, Take 50 mg by mouth at bedtime as needed for sleep. (Patient not taking: Reported on 08/16/2023), Disp: , Rfl:   Allergies: No Known Allergies  Vegas Coffin MOTLEY-MANGRUM, PMHNP

## 2023-08-16 NOTE — Progress Notes (Addendum)
 Pt has been accepted to [facility]. Bed assignment:303 Pt meets inpatient criteria per Jadeka Mangrum, NP  Attending Physician will be Dr. Margurette Shillings.  Report can be called [phone]    Pt can arrive after 8PM  Care Team Notified: [facility] Bevin Bucks, RN, Sela Daft, NP, Willeen Harold, RN

## 2023-08-16 NOTE — ED Notes (Signed)
 Second call to give report nurse to return call to [phone]

## 2023-08-16 NOTE — ED Notes (Addendum)
 Attempted to call report to [facility] adult psych at [phone] . No answer but will call again in 20 minutes.

## 2023-08-16 NOTE — ED Provider Notes (Signed)
 Patient is here voluntarily for SI  Recent psych medications from earlier this month re-ordered while pending TTS evaluation   Yosmar Ryker, Janalyn Me, MD 08/16/23 [phone]

## 2023-08-17 ENCOUNTER — Other Ambulatory Visit: Payer: Self-pay

## 2023-08-17 ENCOUNTER — Encounter: Payer: Self-pay | Admitting: Psychiatry

## 2023-08-17 DIAGNOSIS — F209 Schizophrenia, unspecified: Secondary | ICD-10-CM

## 2023-08-17 NOTE — H&P (Signed)
 Psychiatric Admission Assessment Adult  Patient Identification: Carly Brooks MRN:  [id number] Date of Evaluation:  08/17/2023 Chief Complaint:  Schizophrenia (HCC) [F20.9]   History of Present Illness: Carly Brooks is a 28 y.o. female admitted: Presented to the ED on 08/15/2023  4:28 PM for suicidal Ideation without a plan. She carries the psychiatric diagnoses of schizophrenia, and depression Patient is admitted to adult psych unit with Q15 min safety monitoring. Multidisciplinary team approach is offered. Medication management; group/milieu therapy is offered.  Patient is noted to be resting in her bed.  She reports having auditory hallucinations telling her to kill herself.  She reports that she was just discharged from old [place] last Thursday and she stayed there for 2 weeks for similar complaints.  She states that she felt she was not ready for discharge and thus why she brought herself to the emergency room again.  Reports command type auditory hallucinations telling her to kill herself.  She reports visual hallucinations of seeing cats and dogs.  Patient reports that she does not feel safe outside the hospital as police officers might be locking her up.  She reports that she has a court hearing on June 17 for simple assault charges on her baby's dad.  She reports she currently living with the baby's dad and they have a 77-year-old and 66-year-old together.  She endorses depression with poor appetite and sleep, feeling hopeless and worthless.  She has fair energy and motivation.  She has ongoing suicidal ideation with no intent or plan, denies homicidal ideation.  She endorses generalized anxiety but denies panic attacks.  No history of physical or sexual abuse reported and denies ongoing nightmares or flashbacks.  She denies current or recent episodes of mania/hypomania  Total Time spent with patient: 1 hour Sleep  Sleep:Sleep: Fair  Past Psychiatric History:  Psychiatric  History:  Information collected from patient/chart  Prev Dx/Sx: Depression and anxiety Current Psych Provider: None reported Home Meds (current): Multiple psychotropic medications patient is unable to recall Previous Med Trials: Abilify , Risperdal , Invega  Sustenna, Zyprexa  Therapy: None reported  Prior Psych Hospitalization: Multiple times Prior Self Harm: None reported Prior Violence: Denies  Family Psych History: Mom with depression and anxiety Family Hx suicide: Denies  Social History:  Developmental Hx: Unknown Educational Hx: High school Occupational Hx: On disability Legal Hx: Has court hearing for simple assault Living Situation: With the father of her children Spiritual Hx: Denies Access to weapons/lethal means: Denies  Substance History Alcohol: None reported  Tobacco: None reported Illicit drugs: None reported Prescription drug abuse: None reported Rehab hx: None reported Is the patient at risk to self? Yes.    Has the patient been a risk to self in the past 6 months? Yes.    Has the patient been a risk to self within the distant past? No.  Is the patient a risk to others? No.  Has the patient been a risk to others in the past 6 months? No.  Has the patient been a risk to others within the distant past? No.   [place] Scale:  Flowsheet Row Admission (Current) from 08/16/2023 in [facility] INPAT[facility] BEH[facility] ED from 08/15/2023 in [facility] Emergency Department at [facility] ED from 07/28/2023 in [facility]  C-SSRS RISK CATEGORY Low Risk Low Risk Low Risk        Past Medical History:  Past Medical History:  Diagnosis Date   Anxiety    Cluster B personality disorder (HCC)  Depression    Medical history non-contributory     Past Surgical History:  Procedure Laterality Date   CESAREAN SECTION     CESAREAN SECTION  04/18/2021   CESAREAN SECTION WITH BILATERAL TUBAL LIGATION N/A 04/18/2021   Procedure:  CESAREAN SECTION   Family History:  Family History  Problem Relation Age of Onset   Diabetes Mother     Social History:  Social History   Substance and Sexual Activity  Alcohol Use Not Currently   Alcohol/week: 2.0 standard drinks of alcohol   Types: 2 Glasses of wine per week     Social History   Substance and Sexual Activity  Drug Use Never      Allergies:  No Known Allergies Lab Results: No results found for this or any previous visit (from the past 48 hours).  Blood Alcohol level:  Lab Results  Component Value Date   [facility] <15 08/15/2023   ETH <15 07/28/2023    Metabolic Disorder Labs:  Lab Results  Component Value Date   HGBA1C 5.0 07/28/2023   MPG 96.8 07/28/2023   MPG 93.93 06/09/2023   Lab Results  Component Value Date   PROLACTIN 14.2 07/12/2023   Lab Results  Component Value Date   CHOL 146 07/28/2023   TRIG 48 07/28/2023   HDL 41 07/28/2023   CHOLHDL 3.6 07/28/2023   VLDL 10 07/28/2023   LDLCALC 95 07/28/2023   LDLCALC 101 (H) 06/09/2023    Current Medications: Current Facility-Administered Medications  Medication Dose Route Frequency Provider Last Rate Last Admin   acetaminophen  (TYLENOL ) tablet 650 mg  650 mg Oral Q6H PRN Motley-Mangrum, Jadeka A, PMHNP       alum & mag hydroxide-simeth (MAALOX/MYLANTA) 200-200-20 MG/5ML suspension 30 mL  30 mL Oral Q4H PRN Motley-Mangrum, Jadeka A, PMHNP       haloperidol  (HALDOL ) tablet 5 mg  5 mg Oral TID PRN Motley-Mangrum, Jadeka A, PMHNP   5 mg at 08/17/23 1706   And   diphenhydrAMINE  (BENADRYL ) capsule 50 mg  50 mg Oral TID PRN Motley-Mangrum, Jadeka A, PMHNP   50 mg at 08/17/23 1706   haloperidol  lactate (HALDOL ) injection 5 mg  5 mg Intramuscular TID PRN Motley-Mangrum, Jadeka A, PMHNP       And   diphenhydrAMINE  (BENADRYL ) injection 50 mg  50 mg Intramuscular TID PRN Motley-Mangrum, Jadeka A, PMHNP       And   LORazepam  (ATIVAN ) injection 2 mg  2 mg Intramuscular TID PRN Motley-Mangrum, Jadeka A,  PMHNP       haloperidol  lactate (HALDOL ) injection 10 mg  10 mg Intramuscular TID PRN Motley-Mangrum, Jadeka A, PMHNP       And   diphenhydrAMINE  (BENADRYL ) injection 50 mg  50 mg Intramuscular TID PRN Motley-Mangrum, Jadeka A, PMHNP       And   LORazepam  (ATIVAN ) injection 2 mg  2 mg Intramuscular TID PRN Motley-Mangrum, Jadeka A, PMHNP       FLUoxetine  (PROZAC ) capsule 20 mg  20 mg Oral Daily Motley-Mangrum, Jadeka A, PMHNP   20 mg at 08/17/23 1100   hydrOXYzine  (ATARAX ) tablet 25 mg  25 mg Oral Q8H PRN Motley-Mangrum, Jadeka A, PMHNP   25 mg at 08/17/23 1706   magnesium  hydroxide (MILK OF MAGNESIA) suspension 30 mL  30 mL Oral Daily PRN Motley-Mangrum, Jadeka A, PMHNP       nicotine (NICODERM CQ - dosed in mg/24 hours) patch 21 mg  21 mg Transdermal Daily Motley-Mangrum, Jadeka A, PMHNP  21 mg at 08/17/23 1100   OLANZapine  (ZYPREXA ) tablet 10 mg  10 mg Oral QHS Motley-Mangrum, Jadeka A, PMHNP       traZODone  (DESYREL ) tablet 50 mg  50 mg Oral QHS PRN Motley-Mangrum, Jadeka A, PMHNP       PTA Medications: Medications Prior to Admission  Medication Sig Dispense Refill Last Dose/Taking   ARIPiprazole  (ABILIFY ) 20 MG tablet Take 20 mg by mouth daily. (Patient not taking: Reported on 08/16/2023)      famotidine (PEPCID) 20 MG tablet Take 20 mg by mouth daily as needed for heartburn. (Patient not taking: Reported on 08/16/2023)      FLUoxetine  (PROZAC ) 20 MG capsule Take 1 capsule (20 mg total) by mouth daily. (Patient not taking: Reported on 08/16/2023) 30 capsule 0    hydrOXYzine  (VISTARIL ) 25 MG capsule Take 25 mg by mouth every 6 (six) hours as needed. (Patient not taking: Reported on 08/16/2023)      OLANZapine  (ZYPREXA ) 10 MG tablet Take 10 mg by mouth at bedtime. (Patient not taking: Reported on 08/16/2023)      paliperidone  (INVEGA  SUSTENNA) 156 MG/ML SUSY injection Inject 1 mL (156 mg total) into the muscle every 28 (twenty-eight) days. Patient last received on 07/10/23 and is due for next LAI  maintenance dose of 156 mg on 08/07/23 (Patient not taking: Reported on 08/16/2023) 1 mL 2    QUEtiapine  (SEROQUEL ) 200 MG tablet Take 200 mg by mouth at bedtime. (Patient not taking: Reported on 08/16/2023)      risperiDONE  (RISPERDAL ) 3 MG tablet Take 3 mg by mouth 2 (two) times daily. (Patient not taking: Reported on 08/16/2023)      traZODone  (DESYREL ) 50 MG tablet Take 50 mg by mouth at bedtime as needed for sleep. (Patient not taking: Reported on 08/16/2023)       Psychiatric Specialty Exam:  Presentation  General Appearance:  Casual  Eye Contact: Minimal  Speech: Blocked  Speech Volume: Decreased    Mood and Affect  Mood: Dysphoric  Affect: Flat; Depressed   Thought Process  Thought Processes: Coherent  Descriptions of Associations:Intact  Orientation:Full (Time, Place and Person)  Thought Content:Illogical; Paranoid Ideation  Hallucinations:Hallucinations: Auditory; Command; Visual Description of Command Hallucinations: telling her to hurt self Description of Visual Hallucinations: seeing demons  Ideas of Reference:Paranoia  Suicidal Thoughts:Suicidal Thoughts: No SI Passive Intent and/or Plan: Without Intent; Without Plan  Homicidal Thoughts:Homicidal Thoughts: No   Sensorium  Memory: Immediate Fair; Recent Fair; Remote Fair  Judgment: Impaired  Insight: Shallow   Executive Functions  Concentration: Poor  Attention Span: Poor  Recall: Fair  Fund of Knowledge: Fair  Language: Fair   Psychomotor Activity  Psychomotor Activity: Psychomotor Activity: Normal   Assets  Assets: Desire for Improvement; Physical Health; Social Support    Musculoskeletal: Strength & Muscle Tone: within normal limits Gait & Station: normal  Physical Exam: Physical Exam Vitals and nursing note reviewed.  HENT:     Mouth/Throat:     Mouth: Mucous membranes are moist.  Cardiovascular:     Rate and Rhythm: Normal rate.     Pulses: Normal  pulses.  Pulmonary:     Effort: Pulmonary effort is normal.  Abdominal:     Palpations: Abdomen is soft.  Skin:    General: Skin is warm.  Neurological:     Mental Status: She is alert.    Review of Systems  Constitutional: Negative.   Eyes: Negative.   Cardiovascular: Negative.   Gastrointestinal: Negative.   Skin:  Negative.    Blood pressure 120/77, pulse (!) 115, temperature 97.7 F (36.5 C), resp. rate 20, height 5\' 8"  (1.727 m), weight 116.6 kg, SpO2 100%, unknown if currently breastfeeding. Body mass index is 39.08 kg/m.  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  She was recently discharged from inpatient psychiatric facility with poor response to medication.  She needs to be monitored closely.  Treatment Plan Summary:  Safety and Monitoring:             -- Voluntary admission to inpatient psychiatric unit for safety, stabilization and treatment             -- Daily contact with patient to assess and evaluate symptoms and progress in treatment             -- Patient's case to be discussed in multi-disciplinary team meeting             -- Observation Level: q15 minute checks             -- Vital signs:  q12 hours             -- Precautions: suicide, elopement, and assault   2. Psychiatric Diagnoses and Treatment:              Patient received Invega  Sustenna Aug 07, 2023.  To reduce polypharmacy discontinued Risperdal  3 mg twice daily.  Continued Zyprexa  10 mg daily for mood stabilization.  Continue Prozac  20 mg daily     -- The risks/benefits/side-effects/alternatives to this medication were discussed in detail with the patient and time was given for questions. The patient  consents to medication trial.                -- Metabolic profile and EKG monitoring obtained while on an atypical antipsychotic (BMI: Lipid Panel: HbgA1c: QTc:)              -- Encouraged patient to participate in unit milieu and in scheduled group therapies                            3. Medical Issues Being Addressed:    No urgent medical needs identified at this time 4. Discharge Planning:              -- Social work and case management to assist with discharge planning and identification of hospital follow-up needs prior to discharge             -- Estimated LOS: 5-7 days             -- Discharge Concerns: Need to establish a safety plan; Medication compliance and effectiveness             -- Discharge Goals: Return home with outpatient referrals follow ups  Physician Treatment Plan for Primary Diagnosis: Schizophrenia (HCC) Long Term Goal(s): Improvement in symptoms so as ready for discharge  Short Term Goals: Ability to identify changes in lifestyle to reduce recurrence of condition will improve, Ability to verbalize feelings will improve, Ability to disclose and discuss suicidal ideas, and Ability to demonstrate self-control will improve  Physician Treatment Plan for Secondary Diagnosis: Principal Problem:   Schizophrenia (HCC)  Long Term Goal(s): Improvement in symptoms so  as ready for discharge  Short Term Goals: Ability to identify changes in lifestyle to reduce recurrence of condition will improve, Ability to verbalize feelings will improve, Ability to disclose and discuss suicidal ideas, Ability to demonstrate self-control will improve, and Ability to identify and develop effective coping behaviors will improve  I certify that inpatient services furnished can reasonably be expected to improve the patient's condition.    Brach Birdsall, MD 5/30/20255:49 PM

## 2023-08-17 NOTE — Progress Notes (Signed)
 Patient sleeping. Will administer morning medications when patient awakens.

## 2023-08-17 NOTE — Plan of Care (Signed)
   Problem: Education: Goal: Emotional status will improve Outcome: Not Progressing Goal: Mental status will improve Outcome: Not Progressing Goal: Verbalization of understanding the information provided will improve Outcome: Not Progressing

## 2023-08-17 NOTE — Group Note (Signed)
 Date:  08/17/2023 Time:  4:10 PM  Group Topic/Focus:  Activity Group: The focus of the group is to promote activity for the patients and encourage them to go outside to the courtyard and get some fresh air and some exercise.    Participation Level:  Did Not Attend   Carly Brooks Carly Brooks 08/17/2023, 4:10 PM

## 2023-08-17 NOTE — Progress Notes (Signed)
   08/17/23 1100  Psych Admission Type (Psych Patients Only)  Admission Status Voluntary  Psychosocial Assessment  Patient Complaints None  Eye Contact Avoids  Facial Expression Flat;Sullen  Affect Flat  Speech Logical/coherent;Soft  Interaction Guarded;Isolative  Motor Activity Slow  Appearance/Hygiene Unremarkable  Behavior Characteristics Cooperative;Guarded  Mood Sullen  Thought Process  Coherency Blocking  Content Paranoia (Patient verablly reports paranoia)  Delusions None reported or observed  Perception Hallucinations  Hallucination Auditory  Judgment Poor  Confusion Mild  Danger to Self  Current suicidal ideation? Denies  Self-Injurious Behavior No self-injurious ideation or behavior indicators observed or expressed   Danger to Others  Danger to Others None reported or observed

## 2023-08-17 NOTE — Plan of Care (Signed)
   Problem: Education: Goal: Verbalization of understanding the information provided will improve Outcome: Progressing

## 2023-08-17 NOTE — Plan of Care (Signed)
  Problem: Education: Goal: Emotional status will improve Outcome: Not Progressing Goal: Mental status will improve Outcome: Progressing   Problem: Activity: Goal: Interest or engagement in activities will improve Outcome: Not Progressing Goal: Sleeping patterns will improve Outcome: Not Progressing   Problem: Health Behavior/Discharge Planning: Goal: Compliance with treatment plan for underlying cause of condition will improve Outcome: Progressing   Problem: Physical Regulation: Goal: Ability to maintain clinical measurements within normal limits will improve Outcome: Progressing   Problem: Safety: Goal: Periods of time without injury will increase Outcome: Progressing

## 2023-08-17 NOTE — Progress Notes (Signed)
 Patient admitted from [facility] ED under voluntary committment after voicing suicidal ideations and auditory hallucinations. She currently denies SI and she is able to contract for safety. She endorses depression although 5/10 and anxiety. She denies SI, and she is pleasant on cooperative on approach.

## 2023-08-17 NOTE — Progress Notes (Signed)
 Patient walked to the dayroom without pants on. She reports she took her other scrub pants off because they were to small. Patient escorted back to her room and provided with another pair of scrub pants.

## 2023-08-17 NOTE — Progress Notes (Signed)
 Patient approached the nurses station wearing only underwear. Patient had on no top, bra or pants. Patient reports she was coming to get scrubs. Of note, patient was wearing scrubs earlier today in her room. Patient escorted back to her room and scrubs were provided which patient did put on.

## 2023-08-17 NOTE — Group Note (Signed)
 Recreation Therapy Group Note   Group Topic:Leisure Education  Group Date: 08/17/2023 Start Time: 1040 End Time: 1140 Facilitators: Deatrice Factor, LRT, CTRS Location: Craft Room  Group Description: Leisure. Patients were given the option to choose from singing karaoke, coloring mandalas, using oil pastels, journaling, or playing with play-doh. LRT and pts discussed the meaning of leisure, the importance of participating in leisure during their free time/when they're outside of the hospital, as well as how our leisure interests can also serve as coping skills.   Goal Area(s) Addressed:  Patient will identify a current leisure interest.  Patient will learn the definition of "leisure". Patient will practice making a positive decision. Patient will have the opportunity to try a new leisure activity. Patient will communicate with peers and LRT.    Affect/Mood: N/A   Participation Level: Did not attend    Clinical Observations/Individualized Feedback: Patient did not attend group.   Plan: Continue to engage patient in RT group sessions 2-3x/week.   Deatrice Factor, LRT, CTRS 08/17/2023 1:28 PM
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
## 2023-08-17 NOTE — Group Note (Signed)
 Date:  08/17/2023 Time:  10:43 AM  Group Topic/Focus:  Personal Choices and Values:   The focus of this group is to help patients assess and explore the importance of values in their lives, how their values affect their decisions, how they express their values and what opposes their expression.    Participation Level:  Did Not Attend   [name] Mersadie Kavanaugh 08/17/2023, 10:43 AM

## 2023-08-17 NOTE — BHH Suicide Risk Assessment (Signed)
 [facility] Admission Suicide Risk Assessment   Nursing information obtained from:  Patient Demographic factors:  Low socioeconomic status Current Mental Status:  NA Loss Factors:  NA Historical Factors:  NA Risk Reduction Factors:  Living with another person, especially a relative  Total Time spent with patient: 30 minutes Principal Problem: Schizophrenia (HCC) Diagnosis:  Principal Problem:   Schizophrenia (HCC)  Subjective Data: Carly Brooks is a 28 y.o. female admitted: Presented to the ED on 08/15/2023  4:28 PM for suicidal Ideation without a plan, auditory hallucinations. She carries the psychiatric diagnoses of schizophrenia, and depression Patient is admitted to adult psych unit with Q15 min safety monitoring. Multidisciplinary team approach is offered. Medication management; group/milieu therapy is offered.   Continued Clinical Symptoms:  Alcohol Use Disorder Identification Test Final Score (AUDIT): 5 The "Alcohol Use Disorders Identification Test", Guidelines for Use in Primary Care, Second Edition.  World Science writer [facility] Ed). Score between 0-7:  no or low risk or alcohol related problems. Score between 8-15:  moderate risk of alcohol related problems. Score between 16-19:  high risk of alcohol related problems. Score 20 or above:  warrants further diagnostic evaluation for alcohol dependence and treatment.   CLINICAL FACTORS:   Depression:   Hopelessness   Musculoskeletal: Strength & Muscle Tone: within normal limits Gait & Station: normal Patient leans: N/A  Psychiatric Specialty Exam:  Presentation  General Appearance:  Casual  Eye Contact: Minimal  Speech: Blocked  Speech Volume: Decreased  Handedness: Right   Mood and Affect  Mood: Dysphoric  Affect: Flat; Depressed   Thought Process  Thought Processes: Coherent  Descriptions of Associations:Intact  Orientation:Full (Time, Place and Person)  Thought Content:Illogical;  Paranoid Ideation  History of Schizophrenia/Schizoaffective disorder:Yes  Duration of Psychotic Symptoms:Greater than six months  Hallucinations:Hallucinations: Auditory; Command; Visual Description of Command Hallucinations: telling her to hurt self Description of Visual Hallucinations: seeing demons  Ideas of Reference:Paranoia  Suicidal Thoughts:Suicidal Thoughts: No SI Passive Intent and/or Plan: Without Intent; Without Plan  Homicidal Thoughts:Homicidal Thoughts: No   Sensorium  Memory: Immediate Fair; Recent Fair; Remote Fair  Judgment: Impaired  Insight: Shallow   Executive Functions  Concentration: Poor  Attention Span: Poor  Recall: Fair  Fund of Knowledge: Fair  Language: Fair   Psychomotor Activity  Psychomotor Activity: Psychomotor Activity: Normal   Assets  Assets: Desire for Improvement; Physical Health; Social Support   Sleep  Sleep: Sleep: Fair    Physical Exam: Physical Exam ROS Blood pressure 120/77, pulse (!) 115, temperature 97.7 F (36.5 C), resp. rate 20, height 5\' 8"  (1.727 m), weight 116.6 kg, SpO2 100%, unknown if currently breastfeeding. Body mass index is 39.08 kg/m.   COGNITIVE FEATURES THAT CONTRIBUTE TO RISK:  None    SUICIDE RISK:   Moderate:  Frequent suicidal ideation with limited intensity, and duration, some specificity in terms of plans, no associated intent, good self-control, limited dysphoria/symptomatology, some risk factors present, and identifiable protective factors, including available and accessible social support.  PLAN OF CARE: Patient is admitted to adult psych unit with Q15 min safety monitoring. Multidisciplinary team approach is offered. Medication management; group/milieu therapy is offered.   I certify that inpatient services furnished can reasonably be expected to improve the patient's condition.   Aurelia Blotter, MD 08/17/2023, 5:46 PM

## 2023-08-17 NOTE — BH IP Treatment Plan (Signed)
 Interdisciplinary Treatment and Diagnostic Plan Update  08/17/2023 Time of Session: 10:13 AM Carly Brooks MRN: [id number]  Principal Diagnosis: Schizophrenia [facility])  Secondary Diagnoses: Principal Problem:   Schizophrenia (HCC)   Current Medications:  Current Facility-Administered Medications  Medication Dose Route Frequency Provider Last Rate Last Admin   acetaminophen  (TYLENOL ) tablet 650 mg  650 mg Oral Q6H PRN Motley-Mangrum, Jadeka A, PMHNP       alum & mag hydroxide-simeth (MAALOX/MYLANTA) 200-200-20 MG/5ML suspension 30 mL  30 mL Oral Q4H PRN Motley-Mangrum, Jadeka A, PMHNP       haloperidol  (HALDOL ) tablet 5 mg  5 mg Oral TID PRN Motley-Mangrum, Jadeka A, PMHNP       And   diphenhydrAMINE  (BENADRYL ) capsule 50 mg  50 mg Oral TID PRN Motley-Mangrum, Jadeka A, PMHNP       haloperidol  lactate (HALDOL ) injection 5 mg  5 mg Intramuscular TID PRN Motley-Mangrum, Jadeka A, PMHNP       And   diphenhydrAMINE  (BENADRYL ) injection 50 mg  50 mg Intramuscular TID PRN Motley-Mangrum, Jadeka A, PMHNP       And   LORazepam  (ATIVAN ) injection 2 mg  2 mg Intramuscular TID PRN Motley-Mangrum, Jadeka A, PMHNP       haloperidol  lactate (HALDOL ) injection 10 mg  10 mg Intramuscular TID PRN Motley-Mangrum, Jadeka A, PMHNP       And   diphenhydrAMINE  (BENADRYL ) injection 50 mg  50 mg Intramuscular TID PRN Motley-Mangrum, Jadeka A, PMHNP       And   LORazepam  (ATIVAN ) injection 2 mg  2 mg Intramuscular TID PRN Motley-Mangrum, Jadeka A, PMHNP       FLUoxetine  (PROZAC ) capsule 20 mg  20 mg Oral Daily Motley-Mangrum, Jadeka A, PMHNP   20 mg at 08/17/23 1100   hydrOXYzine  (ATARAX ) tablet 25 mg  25 mg Oral Q8H PRN Motley-Mangrum, Jadeka A, PMHNP       magnesium  hydroxide (MILK OF MAGNESIA) suspension 30 mL  30 mL Oral Daily PRN Motley-Mangrum, Jadeka A, PMHNP       nicotine (NICODERM CQ - dosed in mg/24 hours) patch 21 mg  21 mg Transdermal Daily Motley-Mangrum, Jadeka A, PMHNP   21 mg at  08/17/23 1100   OLANZapine  (ZYPREXA ) tablet 10 mg  10 mg Oral QHS Motley-Mangrum, Jadeka A, PMHNP       risperiDONE  (RISPERDAL ) tablet 3 mg  3 mg Oral BID Motley-Mangrum, Jadeka A, PMHNP   3 mg at 08/17/23 1100   traZODone  (DESYREL ) tablet 50 mg  50 mg Oral QHS PRN Motley-Mangrum, Jadeka A, PMHNP       PTA Medications: Medications Prior to Admission  Medication Sig Dispense Refill Last Dose/Taking   ARIPiprazole  (ABILIFY ) 20 MG tablet Take 20 mg by mouth daily. (Patient not taking: Reported on 08/16/2023)      famotidine (PEPCID) 20 MG tablet Take 20 mg by mouth daily as needed for heartburn. (Patient not taking: Reported on 08/16/2023)      FLUoxetine  (PROZAC ) 20 MG capsule Take 1 capsule (20 mg total) by mouth daily. (Patient not taking: Reported on 08/16/2023) 30 capsule 0    hydrOXYzine  (VISTARIL ) 25 MG capsule Take 25 mg by mouth every 6 (six) hours as needed. (Patient not taking: Reported on 08/16/2023)      OLANZapine  (ZYPREXA ) 10 MG tablet Take 10 mg by mouth at bedtime. (Patient not taking: Reported on 08/16/2023)      paliperidone  (INVEGA  SUSTENNA) 156 MG/ML SUSY injection Inject 1 mL (156 mg total) into the  muscle every 28 (twenty-eight) days. Patient last received on 07/10/23 and is due for next LAI maintenance dose of 156 mg on 08/07/23 (Patient not taking: Reported on 08/16/2023) 1 mL 2    QUEtiapine  (SEROQUEL ) 200 MG tablet Take 200 mg by mouth at bedtime. (Patient not taking: Reported on 08/16/2023)      risperiDONE  (RISPERDAL ) 3 MG tablet Take 3 mg by mouth 2 (two) times daily. (Patient not taking: Reported on 08/16/2023)      traZODone  (DESYREL ) 50 MG tablet Take 50 mg by mouth at bedtime as needed for sleep. (Patient not taking: Reported on 08/16/2023)       Patient Stressors:    Patient Strengths:    Treatment Modalities: Medication Management, Group therapy, Case management,  1 to 1 session with clinician, Psychoeducation, Recreational therapy.   Physician Treatment Plan for  Primary Diagnosis: Schizophrenia (HCC) Long Term Goal(s):     Short Term Goals:    Medication Management: Evaluate patient's response, side effects, and tolerance of medication regimen.  Therapeutic Interventions: 1 to 1 sessions, Unit Group sessions and Medication administration.  Evaluation of Outcomes: Not Met  Physician Treatment Plan for Secondary Diagnosis: Principal Problem:   Schizophrenia (HCC)  Long Term Goal(s):     Short Term Goals:       Medication Management: Evaluate patient's response, side effects, and tolerance of medication regimen.  Therapeutic Interventions: 1 to 1 sessions, Unit Group sessions and Medication administration.  Evaluation of Outcomes: Not Met   RN Treatment Plan for Primary Diagnosis: Schizophrenia (HCC) Long Term Goal(s): Knowledge of disease and therapeutic regimen to maintain health will improve  Short Term Goals: Ability to verbalize frustration and anger appropriately will improve, Ability to demonstrate self-control, Ability to participate in decision making will improve, Ability to verbalize feelings will improve, Ability to disclose and discuss suicidal ideas, Ability to identify and develop effective coping behaviors will improve, and Compliance with prescribed medications will improve  Medication Management: RN will administer medications as ordered by provider, will assess and evaluate patient's response and provide education to patient for prescribed medication. RN will report any adverse and/or side effects to prescribing provider.  Therapeutic Interventions: 1 on 1 counseling sessions, Psychoeducation, Medication administration, Evaluate responses to treatment, Monitor vital signs and CBGs as ordered, Perform/monitor CIWA, COWS, AIMS and Fall Risk screenings as ordered, Perform wound care treatments as ordered.  Evaluation of Outcomes: Not Met   LCSW Treatment Plan for Primary Diagnosis: Schizophrenia (HCC) Long Term Goal(s):  Safe transition to appropriate next level of care at discharge, Engage patient in therapeutic group addressing interpersonal concerns.  Short Term Goals: Engage patient in aftercare planning with referrals and resources, Increase social support, Increase ability to appropriately verbalize feelings, Increase emotional regulation, Facilitate acceptance of mental health diagnosis and concerns, Facilitate patient progression through stages of change regarding substance use diagnoses and concerns, Identify triggers associated with mental health/substance abuse issues, and Increase skills for wellness and recovery  Therapeutic Interventions: Assess for all discharge needs, 1 to 1 time with Social worker, Explore available resources and support systems, Assess for adequacy in community support network, Educate family and significant other(s) on suicide prevention, Complete Psychosocial Assessment, Interpersonal group therapy.  Evaluation of Outcomes: Not Met   Progress in Treatment: Attending groups: No. Participating in groups: No. Taking medication as prescribed: Yes. Toleration medication: Yes. Family/Significant other contact made: No, will contact:  CSW to contact once permission is granted. Patient understands diagnosis: Yes. Discussing patient identified problems/goals with staff:  Yes. Medical problems stabilized or resolved: Yes. Denies suicidal/homicidal ideation: Yes. Issues/concerns per patient self-inventory: No. Other: None  New problem(s) identified: No, Describe:  None  New Short Term/Long Term Goal(s):detox, elimination of symptoms of psychosis, medication management for mood stabilization; elimination of SI thoughts; development of comprehensive mental wellness/sobriety plan.    Patient Goals:  "Be less paranoid so I can go out into the world."      Discharge Plan or Barriers: CSW to assist with the development of appropriate discharge plan.   Reason for Continuation of  Hospitalization: Anxiety Depression Hallucinations Suicidal ideation  Estimated Length of Stay: 1-7 days.   Last 3 [place] Suicide Severity Risk Score: Flowsheet Row Admission (Current) from 08/16/2023 in [facility] ED from 08/15/2023 in [facility] Emergency Department at [facility] ED from 07/28/2023 in [facility]  C-SSRS RISK CATEGORY Low Risk Low Risk Low Risk       Last [facility] 2/9 Scores:    05/23/2023   10:48 AM 03/07/2023    4:33 PM 01/22/2023    3:41 PM  Depression screen PHQ 2/9  Decreased Interest 0 2 2  Down, Depressed, Hopeless 0 2 2  PHQ - 2 Score 0 4 4  Altered sleeping 0 2 2  Tired, decreased energy 0 2 2  Change in appetite 0 2 2  Feeling bad or failure about yourself  0 2 2  Trouble concentrating 1 0 2  Moving slowly or fidgety/restless 0 0 0  Suicidal thoughts 0 0 0  PHQ-9 Score 1 12 14   Difficult doing work/chores Not difficult at all Very difficult Somewhat difficult    Scribe for Treatment Team: Roselle Conner, LCSW 08/17/2023 2:06 PM

## 2023-08-18 DIAGNOSIS — F200 Paranoid schizophrenia: Secondary | ICD-10-CM

## 2023-08-18 MED ORDER — OLANZAPINE 10 MG PO TABS: 20.0000 mg | ORAL_TABLET | Freq: Every day | ORAL | Status: DC

## 2023-08-18 NOTE — Progress Notes (Signed)
 Patient presents with flat affect and preoccupied. Patient is med compliant this morning and states sleeping fine. Patient denies any pain or discomforts. Patient denies SI,HI, and A/V/H with no plan or intent but appears preoccupied with minimal eye contact. Patient remains cooperative in unit at this time.

## 2023-08-18 NOTE — Plan of Care (Signed)
   Problem: Education: Goal: Knowledge of [place] [facility] Education information/materials will improve Outcome: Progressing Goal: Emotional status will improve Outcome: Progressing Goal: Mental status will improve Outcome: Progressing
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
## 2023-08-18 NOTE — Plan of Care (Signed)
   Problem: Coping: Goal: Ability to demonstrate self-control will improve Outcome: Progressing   Problem: Health Behavior/Discharge Planning: Goal: Compliance with treatment plan for underlying cause of condition will improve Outcome: Progressing   Problem: Safety: Goal: Periods of time without injury will increase Outcome: Progressing

## 2023-08-18 NOTE — Progress Notes (Signed)
   08/17/23 2000  Psych Admission Type (Psych Patients Only)  Admission Status Voluntary  Psychosocial Assessment  Patient Complaints None  Eye Contact Avoids  Facial Expression Flat  Affect Flat  Speech Logical/coherent  Interaction Guarded;Isolative  Motor Activity Slow  Appearance/Hygiene Unremarkable  Behavior Characteristics Guarded  Mood Sullen  Thought Process  Coherency Blocking  Content Paranoia  Delusions None reported or observed  Perception Hallucinations  Hallucination Auditory  Judgment Poor  Confusion Mild  Danger to Self  Current suicidal ideation? Denies  Agreement Not to Harm Self Yes  Description of Agreement verbal  Danger to Others  Danger to Others None reported or observed

## 2023-08-18 NOTE — Progress Notes (Signed)
 [facility] MD PROGRESS NOTE  MRN:  [id number] Date of Evaluation:  08/18/2023 Chief Complaint:  Schizophrenia (HCC) [F20.9]     History of Present Illness: Carly Brooks is a 28 y.o. female admitted: Presented to the ED on 08/15/2023  4:28 PM for suicidal Ideation without a plan. She carries the psychiatric diagnoses of schizophrenia, and depression Patient is admitted to adult psych unit with Q15 min safety monitoring. Multidisciplinary team approach is offered. Medication management; group/milieu therapy is offered.    Patient was seen today for reassessment. Patient remains acutely psychotic, appears internally preoccupied , patient has been withdrawn to room so unsure if she is responding to internal stimuli.Overnight patient had outburst where she took off her clothes and attempted to hit staff, she required PRN thorazine. Patient is slightly improved today, though she continues to endorse auditory and visual hallucinations she is able to have meaningful conversation with out agitation. Patient reports that she continues to have auditory hallucinations of two female voices. She states she does not recognize the voices, is not compelled to act on the voices, and does not trust these voices.She states that these voices demanded that she come to the hospital.She reports visual hallucinations of cats and dogs.Patient is having delusions of grandeur, believing she is a God and that she can read minds. Staff reports that patient has medication compliant, required PRNS overnight, slept 16.75 hours continue to monitor closely.     Total Time spent with patient: 1 hour Sleep  Sleep:Sleep: Fair   Past Psychiatric History: See h&p Access to weapons/lethal means: Denies   Substance History Alcohol: None reported   Tobacco: None reported Illicit drugs: None reported Prescription drug abuse: None reported Rehab hx: None reported Is the patient at risk to self? Yes.    Has the patient been a risk to  self in the past 6 months? Yes.    Has the patient been a risk to self within the distant past? No.  Is the patient a risk to others? No.  Has the patient been a risk to others in the past 6 months? No.  Has the patient been a risk to others within the distant past? No.    [place] Scale:  Flowsheet Row Admission (Current) from 08/16/2023 in [facility] INPATIENT BEHAV[facility]AL MEDICINE ED from 08/15/2023 in [facility] Emergency Department at [facility] ED from 07/28/2023 in [facility]  C-SSRS RISK CATEGORY Low Risk Low Risk Low Risk           Past Medical History:      Past Medical History:  Diagnosis Date   Anxiety     Cluster B personality disorder (HCC)     Depression     Medical history non-contributory               Past Surgical History:  Procedure Laterality Date   CESAREAN SECTION       CESAREAN SECTION   04/18/2021   CESAREAN SECTION WITH BILATERAL TUBAL LIGATION N/A 04/18/2021    Procedure: CESAREAN SECTION        Family History:       Family History  Problem Relation Age of Onset   Diabetes Mother            Social History:  Social History        Substance and Sexual Activity  Alcohol Use Not Currently   Alcohol/week: 2.0 standard drinks of alcohol   Types: 2 Glasses of wine per week  Social History       Substance and Sexual Activity  Drug Use Never        Allergies:   Allergies  No Known Allergies   Lab Results:  Lab Results Last 48 Hours  No results found for this or any previous visit (from the past 48 hours).     Blood Alcohol level:  Recent Labs       Lab Results  Component Value Date    [facility] <15 08/15/2023    ETH <15 07/28/2023        Metabolic Disorder Labs:  Recent Labs       Lab Results  Component Value Date    HGBA1C 5.0 07/28/2023    MPG 96.8 07/28/2023    MPG 93.93 06/09/2023      Recent Labs       Lab Results  Component Value Date    PROLACTIN 14.2 07/12/2023       Recent Labs       Lab Results  Component Value Date    CHOL 146 07/28/2023    TRIG 48 07/28/2023    HDL 41 07/28/2023    CHOLHDL 3.6 07/28/2023    VLDL 10 07/28/2023    LDLCALC 95 07/28/2023    LDLCALC 101 (H) 06/09/2023        Current Medications:          Current Facility-Administered Medications  Medication Dose Route Frequency Provider Last Rate Last Admin   acetaminophen  (TYLENOL ) tablet 650 mg  650 mg Oral Q6H PRN Motley-Mangrum, Jadeka A, PMHNP     alum & mag hydroxide-simeth (MAALOX/MYLANTA) 200-200-20 MG/5ML suspension 30 mL  30 mL Oral Q4H PRN Motley-Mangrum, Jadeka A, PMHNP     haloperidol  (HALDOL ) tablet 5 mg  5 mg Oral TID PRN Motley-Mangrum, Jadeka A, PMHNP  5 mg at 08/17/23 1706    And   diphenhydrAMINE  (BENADRYL ) capsule 50 mg  50 mg Oral TID PRN Motley-Mangrum, Jadeka A, PMHNP  50 mg at 08/17/23 1706   haloperidol  lactate (HALDOL ) injection 5 mg  5 mg Intramuscular TID PRN Motley-Mangrum, Jadeka A, PMHNP      And   diphenhydrAMINE  (BENADRYL ) injection 50 mg  50 mg Intramuscular TID PRN Motley-Mangrum, Jadeka A, PMHNP      And   LORazepam  (ATIVAN ) injection 2 mg  2 mg Intramuscular TID PRN Motley-Mangrum, Jadeka A, PMHNP     haloperidol  lactate (HALDOL ) injection 10 mg  10 mg Intramuscular TID PRN Motley-Mangrum, Jadeka A, PMHNP      And   diphenhydrAMINE  (BENADRYL ) injection 50 mg  50 mg Intramuscular TID PRN Motley-Mangrum, Jadeka A, PMHNP      And   LORazepam  (ATIVAN ) injection 2 mg  2 mg Intramuscular TID PRN Motley-Mangrum, Jadeka A, PMHNP     FLUoxetine  (PROZAC ) capsule 20 mg  20 mg Oral Daily Motley-Mangrum, Jadeka A, PMHNP  20 mg at 08/18/23 0905   hydrOXYzine  (ATARAX ) tablet 25 mg  25 mg Oral Q8H PRN Motley-Mangrum, Jadeka A, PMHNP  25 mg at 08/18/23 0905   magnesium  hydroxide (MILK OF MAGNESIA) suspension 30 mL  30 mL Oral Daily PRN Motley-Mangrum, Jadeka A, PMHNP     nicotine  (NICODERM CQ  - dosed in mg/24 hours) patch 21 mg  21 mg Transdermal Daily  Motley-Mangrum, Jadeka A, PMHNP  21 mg at 08/18/23 0905   OLANZapine  (ZYPREXA ) tablet 20 mg  20 mg Oral QHS Arkeem Harts, MD     traZODone  (DESYREL ) tablet 50 mg  50 mg  Oral QHS PRN Motley-Mangrum, Jadeka A, PMHNP  50 mg at 08/17/23 2115        PTA Medications:        Medications Prior to Admission  Medication Sig Dispense Refill Last Dose/Taking   ARIPiprazole  (ABILIFY ) 20 MG tablet Take 20 mg by mouth daily. (Patient not taking: Reported on 08/16/2023)         famotidine (PEPCID) 20 MG tablet Take 20 mg by mouth daily as needed for heartburn. (Patient not taking: Reported on 08/16/2023)         FLUoxetine  (PROZAC ) 20 MG capsule Take 1 capsule (20 mg total) by mouth daily. (Patient not taking: Reported on 08/16/2023) 30 capsule 0     hydrOXYzine  (VISTARIL ) 25 MG capsule Take 25 mg by mouth every 6 (six) hours as needed. (Patient not taking: Reported on 08/16/2023)         OLANZapine  (ZYPREXA ) 10 MG tablet Take 10 mg by mouth at bedtime. (Patient not taking: Reported on 08/16/2023)         paliperidone  (INVEGA  SUSTENNA) 156 MG/ML SUSY injection Inject 1 mL (156 mg total) into the muscle every 28 (twenty-eight) days. Patient last received on 07/10/23 and is due for next LAI maintenance dose of 156 mg on 08/07/23 (Patient not taking: Reported on 08/16/2023) 1 mL 2     QUEtiapine  (SEROQUEL ) 200 MG tablet Take 200 mg by mouth at bedtime. (Patient not taking: Reported on 08/16/2023)         risperiDONE  (RISPERDAL ) 3 MG tablet Take 3 mg by mouth 2 (two) times daily. (Patient not taking: Reported on 08/16/2023)         traZODone  (DESYREL ) 50 MG tablet Take 50 mg by mouth at bedtime as needed for sleep. (Patient not taking: Reported on 08/16/2023)                Psychiatric Specialty Exam:   Presentation  General Appearance:  Casual   Eye Contact: Minimal   Speech: Blocked   Speech Volume: Decreased       Mood and Affect  Mood: Dysphoric   Affect: Flat; Depressed     Thought  Process  Thought Processes: Coherent   Descriptions of Associations:Intact   Orientation:Full (Time, Place and Person)   Thought Content:Illogical; Paranoid Ideation   Hallucinations:Hallucinations: Auditory; Command; Visual Description of Command Hallucinations: telling her to hurt self Description of Visual Hallucinations: seeing demons   Ideas of Reference:Paranoia   Suicidal Thoughts:Suicidal Thoughts: No SI Passive Intent and/or Plan: Without Intent; Without Plan   Homicidal Thoughts:Homicidal Thoughts: No     Sensorium  Memory: Immediate Fair; Recent Fair; Remote Fair   Judgment: Impaired   Insight: Shallow     Executive Functions  Concentration: Poor   Attention Span: Poor   Recall: Fair   Fund of Knowledge: Fair   Language: Fair     Psychomotor Activity  Psychomotor Activity: Psychomotor Activity: Normal     Assets  Assets: Desire for Improvement; Physical Health; Social Support       Musculoskeletal: Strength & Muscle Tone: within normal limits Gait & Station: normal   Physical Exam: Physical Exam Vitals and nursing note reviewed.  HENT:     Mouth/Throat:     Mouth: Mucous membranes are moist.  Cardiovascular:     Rate and Rhythm: Normal rate.     Pulses: Normal pulses.  Pulmonary:     Effort: Pulmonary effort is normal.  Abdominal:     Palpations: Abdomen is soft.  Skin:  General: Skin is warm.  Neurological:     Mental Status: She is alert.  Psychiatric:        Attention and Perception: She perceives auditory and visual hallucinations.        Mood and Affect: Affect is blunt.        Speech: Speech normal.        Behavior: Behavior normal.        Thought Content: Thought content is delusional.        Cognition and Memory: Cognition and memory normal.        Judgment: Judgment normal.      Review of Systems  Constitutional: Negative.   HENT: Negative.    Eyes: Negative.   Respiratory: Negative.     Cardiovascular: Negative.   Gastrointestinal: Negative.   Genitourinary: Negative.   Musculoskeletal: Negative.   Skin: Negative.   Neurological: Negative.   Psychiatric/Behavioral:  Positive for hallucinations.     Blood pressure (!) 139/93, pulse 72, temperature 97.9 F (36.6 C), resp. rate 18, height 5\' 8"  (1.727 m), weight 116.6 kg, SpO2 100%, unknown if currently breastfeeding. Body mass index is 39.08 kg/m.   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  She was recently discharged from inpatient psychiatric facility with poor response to medication.  She needs to be monitored closely.   Treatment Plan Summary:   Patient continues to endorse AVH, appears internally preoccupied, and delusional. Overnight patient  had agitation and aggression and required PRN medications, today there is no aggression noted and is able to have more meaningful conversation.  Increase  ZYPREXA  FROM 10 TO 20mg  HS po due to continued psychosis. Monitor closely.      Safety and Monitoring:             -- Voluntary admission to inpatient psychiatric unit for safety, stabilization and treatment             -- Daily contact with patient to assess and evaluate symptoms and progress in treatment             -- Patient's case to be discussed in multi-disciplinary team meeting             -- Observation Level: q15 minute checks             -- Vital signs:  q12 hours             -- Precautions: suicide, elopement, and assault   2. Psychiatric Diagnoses and Treatment:              Patient received Invega  Sustenna Aug 07, 2023. Increase Zyprexa  to 20 mg daily for continued psychosis.  Continue Prozac  20 mg daily      -- The risks/benefits/side-effects/alternatives to this medication were discussed in detail with the patient and time was given for questions. The patient consents to medication trial.                -- Metabolic profile and EKG monitoring obtained while on an atypical antipsychotic (BMI: Lipid Panel: HbgA1c: QTc:)              -- Encouraged patient to participate in unit milieu and in scheduled group therapies  3. Medical Issues Being Addressed:    No urgent medical needs identified at this time 4. Discharge Planning:              -- Social work and case management to assist with discharge planning and identification of hospital follow-up needs prior to discharge             -- Estimated LOS: 5-7 days             -- Discharge Concerns: Need to establish a safety plan; Medication compliance and effectiveness             -- Discharge Goals: Return home with outpatient referrals follow ups   Physician Treatment Plan for Primary Diagnosis: Schizophrenia (HCC) Long Term Goal(s): Improvement in symptoms so as ready for discharge   Short Term Goals: Ability to identify changes in lifestyle to reduce recurrence of condition will improve, Ability to verbalize feelings will improve, Ability to disclose and discuss suicidal ideas, and Ability to demonstrate self-control will improve   Physician Treatment Plan for Secondary Diagnosis: Principal Problem:   Schizophrenia (HCC)   Long Term Goal(s): Improvement in symptoms so as ready for discharge   Short Term Goals: Ability to identify changes in lifestyle to reduce recurrence of condition will improve, Ability to verbalize feelings will improve, Ability to disclose and discuss suicidal ideas, Ability to demonstrate self-control will improve, and Ability to identify and develop effective coping behaviors will improve   I certify that inpatient services furnished can reasonably be expected to improve the patient's condition.

## 2023-08-18 NOTE — Group Note (Signed)
 Date:  08/18/2023 Time:  1:12 PM  Group Topic/Focus:  Activity Group: The focus of the group is to promote activity for the patients and encourage them to go outside to the courtyard and get some exercise and some fresh air.    Participation Level:  Did Not Attend   [name] Jacia Sickman 08/18/2023, 1:12 PM

## 2023-08-18 NOTE — Group Note (Signed)
 Date:  08/18/2023 Time:  1:06 PM  Group Topic/Focus:  Managing Feelings:   The focus of this group is to identify what feelings patients have difficulty handling and develop a plan to handle them in a healthier way upon discharge.    Participation Level:  Did Not Attend   [name] [name] 08/18/2023, 1:06 PM

## 2023-08-19 DIAGNOSIS — F200 Paranoid schizophrenia: Secondary | ICD-10-CM | POA: Diagnosis not present

## 2023-08-19 MED ORDER — OLANZAPINE 10 MG PO TABS
10.0000 mg | ORAL_TABLET | Freq: Two times a day (BID) | ORAL | Status: DC
  Administered 2023-08-19 – 2023-08-26 (×15): 10 mg via ORAL
  Filled 2023-08-19 (×16): qty 1

## 2023-08-19 NOTE — Progress Notes (Signed)
 Patient denies si,hi and avh. Patient is pleasant and cooperative. Patient remains safe on the unit q15 safety checks are still being performed.

## 2023-08-19 NOTE — Progress Notes (Signed)
 [facility] MD PROGRESS NOTE  MRN:  [id number] Date of Evaluation:  08/19/2023 Chief Complaint:  Schizophrenia (HCC) [F20.9]     History of Present Illness: Carly Brooks is a 28 y.o. female admitted: Presented to the ED on 08/15/2023  4:28 PM for suicidal Ideation without a plan. She carries the psychiatric diagnoses of schizophrenia, and depression Patient is admitted to adult psych unit with Q15 min safety monitoring. Multidisciplinary team approach is offered. Medication management; group/milieu therapy is offered.    Patient was seen today for reassessment. Patient remains acutely psychotic, appears internally preoccupied , patient has been withdrawn to room so unsure if she is responding to internal stimuli. Staff  reports that patient remained withdrawn  Overnight, and refused to take HS medications. On assessment she continues to endorse AVH. Stating she hears 2 female voices, that she does trust and recognize, though they are not commanding that she harms  herself or others. She continues to report VH of cats and  dogs. Patient continues to have delusions of grandeur believing she is a god and being able to read minds. Staff reports no further behavioral outbursts overnight.      Total Time spent with patient: 1 hour Sleep  Sleep:Sleep: Fair   Past Psychiatric History: See h&p Access to weapons/lethal means: Denies   Substance History Alcohol: None reported   Tobacco: None reported Illicit drugs: None reported Prescription drug abuse: None reported Rehab hx: None reported Is the patient at risk to self? Yes.    Has the patient been a risk to self in the past 6 months? Yes.    Has the patient been a risk to self within the distant past? No.  Is the patient a risk to others? No.  Has the patient been a risk to others in the past 6 months? No.  Has the patient been a risk to others within the distant past? No.    [place] Scale:  Flowsheet Row Admission (Current) from 08/16/2023  in [facility] INPATIENT BEHAVIORAL MEDICINE ED from 08/15/2023 in [facility] Emergency Department at [facility] ED from 07/28/2023 in [facility]  C-SSRS RISK CATEGORY Low Risk Low Risk Low Risk           Past Medical History:      Past Medical History:  Diagnosis Date   Anxiety     Cluster B personality disorder (HCC)     Depression     Medical history non-contributory               Past Surgical History:  Procedure Laterality Date   CESAREAN SECTION       CESAREAN SECTION   04/18/2021   CESAREAN SECTION WITH BILATERAL TUBAL LIGATION N/A 04/18/2021    Procedure: CESAREAN SECTION        Family History:       Family History  Problem Relation Age of Onset   Diabetes Mother            Social History:  Social History        Substance and Sexual Activity  Alcohol Use Not Currently   Alcohol/week: 2.0 standard drinks of alcohol   Types: 2 Glasses of wine per week     Social History       Substance and Sexual Activity  Drug Use Never        Allergies:   Allergies  No Known Allergies   Lab Results:  Lab Results Last 48 Hours  No  results found for this or any previous visit (from the past 48 hours).     Blood Alcohol level:  Recent Labs       Lab Results  Component Value Date    [facility] <15 08/15/2023    ETH <15 07/28/2023        Metabolic Disorder Labs:  Recent Labs       Lab Results  Component Value Date    HGBA1C 5.0 07/28/2023    MPG 96.8 07/28/2023    MPG 93.93 06/09/2023      Recent Labs       Lab Results  Component Value Date    PROLACTIN 14.2 07/12/2023      Recent Labs       Lab Results  Component Value Date    CHOL 146 07/28/2023    TRIG 48 07/28/2023    HDL 41 07/28/2023    CHOLHDL 3.6 07/28/2023    VLDL 10 07/28/2023    LDLCALC 95 07/28/2023    LDLCALC 101 (H) 06/09/2023        Current Medications:          Current Facility-Administered Medications  Medication Dose Route Frequency  Provider Last Rate Last Admin   acetaminophen  (TYLENOL ) tablet 650 mg  650 mg Oral Q6H PRN Motley-Mangrum, Jadeka A, PMHNP     alum & mag hydroxide-simeth (MAALOX/MYLANTA) 200-200-20 MG/5ML suspension 30 mL  30 mL Oral Q4H PRN Motley-Mangrum, Jadeka A, PMHNP     haloperidol  (HALDOL ) tablet 5 mg  5 mg Oral TID PRN Motley-Mangrum, Jadeka A, PMHNP  5 mg at 08/17/23 1706    And   diphenhydrAMINE  (BENADRYL ) capsule 50 mg  50 mg Oral TID PRN Motley-Mangrum, Jadeka A, PMHNP  50 mg at 08/17/23 1706   haloperidol  lactate (HALDOL ) injection 5 mg  5 mg Intramuscular TID PRN Motley-Mangrum, Jadeka A, PMHNP      And   diphenhydrAMINE  (BENADRYL ) injection 50 mg  50 mg Intramuscular TID PRN Motley-Mangrum, Jadeka A, PMHNP      And   LORazepam  (ATIVAN ) injection 2 mg  2 mg Intramuscular TID PRN Motley-Mangrum, Jadeka A, PMHNP     haloperidol  lactate (HALDOL ) injection 10 mg  10 mg Intramuscular TID PRN Motley-Mangrum, Jadeka A, PMHNP      And   diphenhydrAMINE  (BENADRYL ) injection 50 mg  50 mg Intramuscular TID PRN Motley-Mangrum, Jadeka A, PMHNP      And   LORazepam  (ATIVAN ) injection 2 mg  2 mg Intramuscular TID PRN Motley-Mangrum, Jadeka A, PMHNP     FLUoxetine  (PROZAC ) capsule 20 mg  20 mg Oral Daily Motley-Mangrum, Jadeka A, PMHNP  20 mg at 08/18/23 1610   hydrOXYzine  (ATARAX ) tablet 25 mg  25 mg Oral Q8H PRN Motley-Mangrum, Jadeka A, PMHNP  25 mg at 08/18/23 0905   magnesium  hydroxide (MILK OF MAGNESIA) suspension 30 mL  30 mL Oral Daily PRN Motley-Mangrum, Jadeka A, PMHNP     nicotine  (NICODERM CQ  - dosed in mg/24 hours) patch 21 mg  21 mg Transdermal Daily Motley-Mangrum, Jadeka A, PMHNP  21 mg at 08/18/23 9604   OLANZapine  (ZYPREXA ) tablet 20 mg  20 mg Oral QHS Coni Homesley, MD     traZODone  (DESYREL ) tablet 50 mg  50 mg Oral QHS PRN Motley-Mangrum, Jadeka A, PMHNP  50 mg at 08/17/23 2115        PTA Medications:        Medications Prior to Admission  Medication Sig Dispense Refill Last  Dose/Taking   ARIPiprazole  (  ABILIFY ) 20 MG tablet Take 20 mg by mouth daily. (Patient not taking: Reported on 08/16/2023)         famotidine (PEPCID) 20 MG tablet Take 20 mg by mouth daily as needed for heartburn. (Patient not taking: Reported on 08/16/2023)         FLUoxetine  (PROZAC ) 20 MG capsule Take 1 capsule (20 mg total) by mouth daily. (Patient not taking: Reported on 08/16/2023) 30 capsule 0     hydrOXYzine  (VISTARIL ) 25 MG capsule Take 25 mg by mouth every 6 (six) hours as needed. (Patient not taking: Reported on 08/16/2023)         OLANZapine  (ZYPREXA ) 10 MG tablet Take 10 mg by mouth at bedtime. (Patient not taking: Reported on 08/16/2023)         paliperidone  (INVEGA  SUSTENNA) 156 MG/ML SUSY injection Inject 1 mL (156 mg total) into the muscle every 28 (twenty-eight) days. Patient last received on 07/10/23 and is due for next LAI maintenance dose of 156 mg on 08/07/23 (Patient not taking: Reported on 08/16/2023) 1 mL 2     QUEtiapine  (SEROQUEL ) 200 MG tablet Take 200 mg by mouth at bedtime. (Patient not taking: Reported on 08/16/2023)         risperiDONE  (RISPERDAL ) 3 MG tablet Take 3 mg by mouth 2 (two) times daily. (Patient not taking: Reported on 08/16/2023)         traZODone  (DESYREL ) 50 MG tablet Take 50 mg by mouth at bedtime as needed for sleep. (Patient not taking: Reported on 08/16/2023)                Psychiatric Specialty Exam:   Presentation  General Appearance:  Casual   Eye Contact: Minimal   Speech: Blocked   Speech Volume: Decreased       Mood and Affect  Mood: Dysphoric   Affect: Flat; Depressed     Thought Process  Thought Processes: Coherent   Descriptions of Associations:Intact   Orientation:Full (Time, Place and Person)   Thought Content:Illogical; Paranoid Ideation   Hallucinations:Hallucinations: Auditory; Command; Visual Description of Command Hallucinations: telling her to hurt self Description of Visual Hallucinations: seeing demons    Ideas of Reference:Paranoia   Suicidal Thoughts:Suicidal Thoughts: No SI Passive Intent and/or Plan: Without Intent; Without Plan   Homicidal Thoughts:Homicidal Thoughts: No     Sensorium  Memory: Immediate Fair; Recent Fair; Remote Fair   Judgment: Impaired   Insight: Shallow     Executive Functions  Concentration: Poor   Attention Span: Poor   Recall: Fair   Fund of Knowledge: Fair   Language: Fair     Psychomotor Activity  Psychomotor Activity: Psychomotor Activity: Normal     Assets  Assets: Desire for Improvement; Physical Health; Social Support       Musculoskeletal: Strength & Muscle Tone: within normal limits Gait & Station: normal   Physical Exam: Physical Exam Vitals and nursing note reviewed.  HENT:     Mouth/Throat:     Mouth: Mucous membranes are moist.  Cardiovascular:     Rate and Rhythm: Normal rate.     Pulses: Normal pulses.  Pulmonary:     Effort: Pulmonary effort is normal.  Abdominal:     Palpations: Abdomen is soft.  Skin:    General: Skin is warm.  Neurological:     Mental Status: She is alert.  Psychiatric:        Attention and Perception: She perceives auditory and visual hallucinations.  Mood and Affect: Affect is blunt.        Speech: Speech normal.        Behavior: Behavior normal.        Thought Content: Thought content is delusional.        Cognition and Memory: Cognition and memory normal.        Judgment: Judgment normal.      Review of Systems  Constitutional: Negative.   HENT: Negative.    Eyes: Negative.   Respiratory: Negative.    Cardiovascular: Negative.   Gastrointestinal: Negative.   Genitourinary: Negative.   Musculoskeletal: Negative.   Skin: Negative.   Neurological: Negative.   Psychiatric/Behavioral:  Positive for hallucinations.     Blood pressure (!) 139/93, pulse 72, temperature 97.9 F (36.6 C), resp. rate 18, height 5\' 8"  (1.727 m), weight 116.6 kg, SpO2 100%, unknown  if currently breastfeeding. Body mass index is 39.08 kg/m.   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  She was recently discharged from inpatient psychiatric facility with poor response to medication.  She needs to be monitored closely.   Treatment Plan Summary:   Patient continues to endorse AVH, appears internally preoccupied, and delusional. Overnight patient slept most of day  and refused night time meds. Though no further aggression noted and is able to have more meaningful conversation.    Transition  ZYPREXA  FROM 20mg  HS to 10mg  BID po due to continued psychosis. Monitor closely.      Safety and Monitoring:             -- Voluntary admission to inpatient psychiatric unit for safety, stabilization and treatment             -- Daily contact with patient to assess and evaluate symptoms and progress in treatment             -- Patient's case to be discussed in multi-disciplinary team meeting             -- Observation Level: q15 minute checks             -- Vital signs:  q12 hours             -- Precautions: suicide, elopement, and assault   2. Psychiatric Diagnoses and Treatment:              Patient received Invega  Sustenna Aug 07, 2023.Transition  ZYPREXA  FROM 20mg  HS to 10mg  BID po due to continued psychosis.  Continue Prozac  20 mg daily     -- The risks/benefits/side-effects/alternatives to this medication were discussed in detail with the patient and time was given for questions. The patient consents to medication trial.                -- Metabolic profile and EKG monitoring obtained while on an atypical antipsychotic (BMI: Lipid Panel: HbgA1c: QTc:)              -- Encouraged  patient to participate in unit milieu and in scheduled group therapies                            3. Medical Issues Being Addressed:    No urgent medical needs identified at this time 4.  Discharge Planning:              -- Social work and case management to assist with discharge planning and identification of hospital follow-up needs prior to discharge             -- Estimated LOS: 5-7 days             -- Discharge Concerns: Need to establish a safety plan; Medication compliance and effectiveness             -- Discharge Goals: Return home with outpatient referrals follow ups   Physician Treatment Plan for Primary Diagnosis: Schizophrenia (HCC) Long Term Goal(s): Improvement in symptoms so as ready for discharge   Short Term Goals: Ability to identify changes in lifestyle to reduce recurrence of condition will improve, Ability to verbalize feelings will improve, Ability to disclose and discuss suicidal ideas, and Ability to demonstrate self-control will improve   Physician Treatment Plan for Secondary Diagnosis: Principal Problem:   Schizophrenia (HCC)   Long Term Goal(s): Improvement in symptoms so as ready for discharge   Short Term Goals: Ability to identify changes in lifestyle to reduce recurrence of condition will improve, Ability to verbalize feelings will improve, Ability to disclose and discuss suicidal ideas, Ability to demonstrate self-control will improve, and Ability to identify and develop effective coping behaviors will improve   I certify that inpatient services furnished can reasonably be expected to improve the patient's condition.

## 2023-08-19 NOTE — [facility] Suicide Risk Assessment (Signed)
 [facility] INPATIENT:  Family/Significant Other Suicide Prevention Education  Suicide Prevention Education:  Education Completed; [name], [phone], Partner,  has been identified by the patient as the family member/significant other with whom the patient will be residing, and identified as the person(s) who will aid the patient in the event of a mental health crisis (suicidal ideations/suicide attempt).  With written consent from the patient, the family member/significant other has been provided the following suicide prevention education, prior to the and/or following the discharge of the patient.  The suicide prevention education provided includes the following: Suicide risk factors Suicide prevention and interventions National Suicide Hotline telephone number [facility] assessment telephone number [facility] Emergency Assistance 911 [facility] and/or Residential Mobile Crisis Unit telephone number  Request made of family/significant other to: Remove weapons (e.g., guns, rifles, knives), all items previously/currently identified as safety concern.   Remove drugs/medications (over-the-counter, prescriptions, illicit drugs), all items previously/currently identified as a safety concern.  The family member/significant other verbalizes understanding of the suicide prevention education information provided.  The family member/significant other agrees to remove the items of safety concern listed above.   The LCSWA contacted the patient partner to provide SPI. The patient partner informed the LCSWA that there were no guns or weapons in the home. He stated that he had not concerns for the patient. The LCSWA provided the partner with who to call in case of crisis. The partner will be there to support the patient after discharge.  Santina Cull 08/19/2023, 11:00 AM

## 2023-08-19 NOTE — Plan of Care (Signed)
   Problem: Activity: Goal: Interest or engagement in activities will improve Outcome: Not Progressing Goal: Sleeping patterns will improve Outcome: Progressing   Problem: Health Behavior/Discharge Planning: Goal: Compliance with treatment plan for underlying cause of condition will improve Outcome: Progressing   Problem: Safety: Goal: Periods of time without injury will increase Outcome: Progressing

## 2023-08-19 NOTE — Progress Notes (Signed)
 Patient presents with flat affect and states she slept well. Patient is med compliant and denies SI,HI, and A/V/H with no plan or intent but appears preoccupied or thought blocking. Patient denies any pain or discomfort and remains cooperative at this time.

## 2023-08-19 NOTE — Group Note (Signed)
 Date:  08/19/2023 Time:  4:07 PM  Group Topic/Focus:   Goals Group:   The focus of this group is to help patients establish daily goals to achieve during treatment and discuss how the patient can incorporate goal setting into their daily lives to aide in recovery.   Participation Level:  Did Not Attend   Amber Williard A Tereso Unangst 08/19/2023, 4:07 PM

## 2023-08-19 NOTE — Progress Notes (Signed)
 Patient asked for some underwear to shower after taking scheduled medication. Patient given hygiene supplies. Shortly after, patient observed walking down the hallway close to her room naked. Patient redirected immediately by staff and guided back to room before reaching any major dayroom area. Patient stated "I was just going to shower." Patient made aware that she cannot step outside her room without clothes on. Patient verbalized understanding and got into shower.

## 2023-08-20 DIAGNOSIS — F209 Schizophrenia, unspecified: Secondary | ICD-10-CM | POA: Diagnosis not present

## 2023-08-20 NOTE — Progress Notes (Signed)
 Patient observed coming out of room naked this evening and had to be redirected back into room with staff. Patient asked the reason for walking out of room naked and patient replied "I was going to use the phone" but patient walked past phone naked appearing to head towards dayroom area. When patient asked if she was okay with people seeing her naked she smiled and replied "yes." Patient then proceeded to put clothes back on with encouragement from staff. Scheduled zyprexa  given and patient was med compliant. Patient made her phone call and is currently back in room.

## 2023-08-20 NOTE — Group Note (Signed)
 [facility] LCSW Group Therapy Note    Group Date: 08/20/2023 Start Time: 1320 End Time: 1350  Type of Therapy and Topic:  Group Therapy:  Overcoming Obstacles  Participation Level:  [facility] PARTICIPATION LEVEL: Did Not Attend   Description of Group:   In this group patients will be encouraged to explore what they see as obstacles to their own wellness and recovery. They will be guided to discuss their thoughts, feelings, and behaviors related to these obstacles. The group will process together ways to cope with barriers, with attention given to specific choices patients can make. Each patient will be challenged to identify changes they are motivated to make in order to overcome their obstacles. This group will be process-oriented, with patients participating in exploration of their own experiences as well as giving and receiving support and challenge from other group members.  Therapeutic Goals: 1. Patient will identify personal and current obstacles as they relate to admission. 2. Patient will identify barriers that currently interfere with their wellness or overcoming obstacles.  3. Patient will identify feelings, thought process and behaviors related to these barriers. 4. Patient will identify two changes they are willing to make to overcome these obstacles:    Summary of Patient Progress Patient did not attend group.    Therapeutic Modalities:   Cognitive Behavioral Therapy Solution Focused Therapy Motivational Interviewing Relapse Prevention Therapy   Randolm Butte, LCSW

## 2023-08-20 NOTE — Plan of Care (Signed)
   Problem: Education: Goal: Knowledge of [facility] Education information/materials will improve Outcome: Progressing Goal: Emotional status will improve Outcome: Progressing Goal: Mental status will improve Outcome: Progressing Goal: Verbalization of understanding the information provided will improve Outcome: Progressing

## 2023-08-20 NOTE — Progress Notes (Signed)
   08/19/23 2023  Psych Admission Type (Psych Patients Only)  Admission Status Voluntary  Psychosocial Assessment  Patient Complaints None  Eye Contact Brief  Facial Expression Flat  Affect Flat  Speech Logical/coherent  Interaction Minimal  Motor Activity Slow  Appearance/Hygiene Unremarkable  Behavior Characteristics Cooperative  Mood Preoccupied  Aggressive Behavior  Effect No apparent injury  Thought Process  Coherency Blocking  Content Paranoia  Delusions None reported or observed  Perception WDL  Hallucination None reported or observed  Judgment Poor  Confusion Mild  Danger to Self  Current suicidal ideation? Denies  Self-Injurious Behavior No self-injurious ideation or behavior indicators observed or expressed   Agreement Not to Harm Self Yes  Description of Agreement  (VERBAL`)  Danger to Others  Danger to Others None reported or observed

## 2023-08-20 NOTE — Group Note (Signed)
 Date:  08/20/2023 Time:  9:46 PM  Group Topic/Focus:  Wrap-Up Group:   The focus of this group is to help patients review their daily goal of treatment and discuss progress on daily workbooks.    Participation Level:  Did Not Attend  Participation Quality:  NONE  Affect:  NONE  Cognitive:  NONE  Insight: None  Engagement in Group:  NONE  Modes of Intervention:  NONE  Additional Comments:  NONE   Fabiola Holy 08/20/2023, 9:46 PM

## 2023-08-20 NOTE — Group Note (Signed)
 Recreation Therapy Group Note   Group Topic:General Recreation  Group Date: 08/20/2023 Start Time: 1500 End Time: 1600 Facilitators: Deatrice Factor, LRT, CTRS Location: Courtyard  Group Description: [employer]. LRT and patients played games of basketball, drew with chalk, and played corn hole while outside in the courtyard while getting fresh air and sunlight. Music was being played in the background. LRT and peers conversed about different games they have played before, what they do in their free time and anything else that is on their minds. LRT encouraged pts to drink water  after being outside, sweating and getting their heart rate up.  Goal Area(s) Addressed: Patient will build on frustration tolerance skills. Patients will partake in a competitive play game with peers. Patients will gain knowledge of new leisure interest/hobby.    Affect/Mood: N/A   Participation Level: Did not attend    Clinical Observations/Individualized Feedback: Patient did not attend group.   Plan: Continue to engage patient in RT group sessions 2-3x/week.   [address], LRT, CTRS 08/20/2023 5:02 PM

## 2023-08-20 NOTE — Group Note (Signed)
 Recreation Therapy Group Note   Group Topic:Health and Wellness  Group Date: 08/20/2023 Start Time: 1000 End Time: 1050 Facilitators: Deatrice Factor, LRT, CTRS Location: Craft Room  Activity Description/Intervention: Therapeutic Drumming. Patients with peers and staff were given the opportunity to engage in a leader facilitated [facility] Group Empowerment Drumming Circle with staff from the [employer], in partnership with [employer]. Teaching laboratory technician and trained [employer], Kathlyne Parchment leading with LRT observing and documenting intervention and pt response. This evidenced-based practice targets 7 areas of health and wellbeing in the human experience including: stress-reduction, exercise, self-expression, camaraderie/support, nurturing, spirituality, and music-making (leisure).    Goal Area(s) Addresses:  Patient will engage in pro-social way in music group.  Patient will follow directions of drum leader on the first prompt. Patient will demonstrate no behavioral issues during group.  Patient will identify if a reduction in stress level occurs as a result of participation in therapeutic drum circle.     Affect/Mood: N/A   Participation Level: Did not attend    Clinical Observations/Individualized Feedback: Patient did not attend group.   Plan: Continue to engage patient in RT group sessions 2-3x/week.   Deatrice Factor, LRT, CTRS 08/20/2023 11:43 AM

## 2023-08-20 NOTE — [facility] Counselor (Signed)
 CSW touched base with patient's partner [name], [phone], Partner to engage in safe discharge planning.   The partner has confirmed that patient CAN return to the home.    CSW has communicated this to the patient and the team.    Tommi Fraise, MSW, [facility] 08/20/2023 3:45 PM

## 2023-08-20 NOTE — [facility] Counselor (Signed)
 CPS reports made due to possible financial and neglect concerns.   CPS social worker forwarded report for screening.    CSW team to continue to assess.    Daeshon Grammatico, MSW, LCSWA 08/20/2023 9:59 AM

## 2023-08-20 NOTE — [facility] Counselor (Signed)
 [employer] [facility] referral sent on patient's behalf.   CSW to continue to assess.    Tomoya Ringwald, MSW, LCSWA 08/20/2023 2:50 PM

## 2023-08-20 NOTE — Plan of Care (Signed)
  Problem: Activity: Goal: Sleeping patterns will improve Outcome: Progressing   Problem: Health Behavior/Discharge Planning: Goal: Compliance with treatment plan for underlying cause of condition will improve Outcome: Progressing   Problem: Safety: Goal: Periods of time without injury will increase Outcome: Progressing   

## 2023-08-20 NOTE — Progress Notes (Signed)
 [facility] MD Progress Note  08/20/2023 3:08 PM [name]  MRN:  [id number]   Subjective:  Carly Brooks is a 28 y.o. female admitted: Presented to the ED on 08/15/2023  4:28 PM for suicidal Ideation without a plan. She carries the psychiatric diagnoses of schizophrenia, and depression Patient is admitted to adult psych unit with Q15 min safety monitoring. Multidisciplinary team approach is offered. Medication management; group/milieu therapy is offered. Patient continues to have delusions of grandeur believing she is a god and being able to read minds.   On follow-up today patient was found in bed.  He continued appear acutely psychotic being internally preoccupied however they do deny hallucinations today.  They continue to be withdrawn.  They were compliant with medications overnight and did not require as needed medications.  They denied SI and HI.  They were guarded when discussing hallucinations and denied current.   Sleep: Fair  Appetite:  Fair  Past Psychiatric History: see h&P Family History:  Family History  Problem Relation Age of Onset   Diabetes Mother    Social History:  Social History   Substance and Sexual Activity  Alcohol Use Not Currently   Alcohol/week: 2.0 standard drinks of alcohol   Types: 2 Glasses of wine per week     Social History   Substance and Sexual Activity  Drug Use Never    Social History   Socioeconomic History   Marital status: Single    Spouse name: Not on file   Number of children: Not on file   Years of education: Not on file   Highest education level: Not on file  Occupational History   Not on file  Tobacco Use   Smoking status: Never   Smokeless tobacco: Never   Tobacco comments:    "I smoke weed everyday"  Vaping Use   Vaping status: Never Used  Substance and Sexual Activity   Alcohol use: Not Currently    Alcohol/week: 2.0 standard drinks of alcohol    Types: 2 Glasses of wine per week   Drug use: Never    Sexual activity: Yes    Partners: Male  Other Topics Concern   Not on file  Social History Narrative   Not on file   Social Drivers of Health   Financial Resource Strain: High Risk (05/23/2023)   Overall Financial Resource Strain (CARDIA)    Difficulty of Paying Living Expenses: Very hard  Food Insecurity: Patient Declined (08/17/2023)   Hunger Vital Sign    Worried About Running Out of Food in the Last Year: Patient declined    Ran Out of Food in the Last Year: Patient declined  Recent Concern: Food Insecurity - Food Insecurity Present (07/12/2023)   Hunger Vital Sign    Worried About Running Out of Food in the Last Year: Sometimes true    Ran Out of Food in the Last Year: Sometimes true  Transportation Needs: Patient Declined (08/17/2023)   PRAPARE - Administrator, Civil Service (Medical): Patient declined    Lack of Transportation (Non-Medical): Patient declined  Recent Concern: Transportation Needs - Unmet Transportation Needs (07/12/2023)   PRAPARE - Transportation    Lack of Transportation (Medical): Yes    Lack of Transportation (Non-Medical): Yes  Physical Activity: Inactive (05/23/2023)   Exercise Vital Sign    Days of Exercise per Week: 0 days    Minutes of Exercise per Session: 0 min  Stress: No Stress Concern Present (05/23/2023)   [employer]  of Occupational Health - Occupational Stress Questionnaire    Feeling of Stress : Not at all  Social Connections: Patient Declined (08/17/2023)   Social Connection and Isolation Panel [NHANES]    Frequency of Communication with Friends and Family: Patient declined    Frequency of Social Gatherings with Friends and Family: Patient declined    Attends Religious Services: Patient declined    Database administrator or Organizations: Patient declined    Attends Banker Meetings: Patient declined    Marital Status: Patient declined  Recent Concern: Social Connections - Moderately Isolated (05/23/2023)   Social  Connection and Isolation Panel [NHANES]    Frequency of Communication with Friends and Family: Never    Frequency of Social Gatherings with Friends and Family: Never    Attends Religious Services: More than 4 times per year    Active Member of [employer] or Organizations: Yes    Attends Engineer, structural: More than 4 times per year    Marital Status: Never married   Past Medical History:  Past Medical History:  Diagnosis Date   Anxiety    Cluster B personality disorder (HCC)    Depression    Medical history non-contributory     Past Surgical History:  Procedure Laterality Date   CESAREAN SECTION     CESAREAN SECTION  04/18/2021   CESAREAN SECTION WITH BILATERAL TUBAL LIGATION N/A 04/18/2021   Procedure: CESAREAN SECTION    Current Medications: Current Facility-Administered Medications  Medication Dose Route Frequency Provider Last Rate Last Admin   acetaminophen  (TYLENOL ) tablet 650 mg  650 mg Oral Q6H PRN Motley-Mangrum, Jadeka A, PMHNP       alum & mag hydroxide-simeth (MAALOX/MYLANTA) 200-200-20 MG/5ML suspension 30 mL  30 mL Oral Q4H PRN Motley-Mangrum, Jadeka A, PMHNP       haloperidol  (HALDOL ) tablet 5 mg  5 mg Oral TID PRN Motley-Mangrum, Jadeka A, PMHNP   5 mg at 08/17/23 1706   And   diphenhydrAMINE  (BENADRYL ) capsule 50 mg  50 mg Oral TID PRN Motley-Mangrum, Jadeka A, PMHNP   50 mg at 08/17/23 1706   haloperidol  lactate (HALDOL ) injection 5 mg  5 mg Intramuscular TID PRN Motley-Mangrum, Jadeka A, PMHNP       And   diphenhydrAMINE  (BENADRYL ) injection 50 mg  50 mg Intramuscular TID PRN Motley-Mangrum, Jadeka A, PMHNP       And   LORazepam  (ATIVAN ) injection 2 mg  2 mg Intramuscular TID PRN Motley-Mangrum, Jadeka A, PMHNP       haloperidol  lactate (HALDOL ) injection 10 mg  10 mg Intramuscular TID PRN Motley-Mangrum, Jadeka A, PMHNP       And   diphenhydrAMINE  (BENADRYL ) injection 50 mg  50 mg Intramuscular TID PRN Motley-Mangrum, Jadeka A, PMHNP       And    LORazepam  (ATIVAN ) injection 2 mg  2 mg Intramuscular TID PRN Motley-Mangrum, Jadeka A, PMHNP       FLUoxetine  (PROZAC ) capsule 20 mg  20 mg Oral Daily Motley-Mangrum, Jadeka A, PMHNP   20 mg at 08/20/23 0843   hydrOXYzine  (ATARAX ) tablet 25 mg  25 mg Oral Q8H PRN Motley-Mangrum, Jadeka A, PMHNP   25 mg at 08/20/23 0843   magnesium  hydroxide (MILK OF MAGNESIA) suspension 30 mL  30 mL Oral Daily PRN Motley-Mangrum, Jadeka A, PMHNP       nicotine  (NICODERM CQ  - dosed in mg/24 hours) patch 21 mg  21 mg Transdermal Daily Motley-Mangrum, Jadeka A, PMHNP   21  mg at 08/20/23 0845   OLANZapine  (ZYPREXA ) tablet 10 mg  10 mg Oral BID Shrivastava, Aryendra, MD   10 mg at 08/20/23 0843   traZODone  (DESYREL ) tablet 50 mg  50 mg Oral QHS PRN Motley-Mangrum, Jadeka A, PMHNP   50 mg at 08/17/23 2115    Lab Results: No results found for this or any previous visit (from the past 48 hours).  Blood Alcohol level:  Lab Results  Component Value Date   [facility] <15 08/15/2023   ETH <15 07/28/2023    Metabolic Disorder Labs: Lab Results  Component Value Date   HGBA1C 5.0 07/28/2023   MPG 96.8 07/28/2023   MPG 93.93 06/09/2023   Lab Results  Component Value Date   PROLACTIN 14.2 07/12/2023   Lab Results  Component Value Date   CHOL 146 07/28/2023   TRIG 48 07/28/2023   HDL 41 07/28/2023   CHOLHDL 3.6 07/28/2023   VLDL 10 07/28/2023   LDLCALC 95 07/28/2023   LDLCALC 101 (H) 06/09/2023    Physical Findings: AIMS:  , ,  ,  ,    CIWA:    COWS:      Psychiatric Specialty Exam:  Presentation  General Appearance:  Casual  Eye Contact: Fair  Speech: Clear and Coherent  Speech Volume: Normal    Mood and Affect  Mood: Euthymic  Affect: Congruent   Thought Process  Thought Processes: Coherent  Descriptions of Associations:Intact  Orientation:Full (Time, Place and Person)  Thought Content:Paranoid Ideation  Hallucinations:Hallucinations: None  Ideas of  Reference:Paranoia  Suicidal Thoughts:Suicidal Thoughts: No  Homicidal Thoughts:Homicidal Thoughts: No   Sensorium  Memory: Immediate Fair; Recent Fair  Judgment: Poor  Insight: Shallow   Executive Functions  Concentration: Poor  Attention Span: Poor  Recall: [employer] of Knowledge: Fair  Language: Fair   Psychomotor Activity  Psychomotor Activity: Psychomotor Activity: Normal  Musculoskeletal: Strength & Muscle Tone: within normal limits Gait & Station: normal Assets  Assets: Manufacturing systems engineer; Desire for Improvement    Physical Exam: Physical Exam HENT:     Head: Atraumatic.  Eyes:     Extraocular Movements: Extraocular movements intact.  Pulmonary:     Effort: Pulmonary effort is normal.  Neurological:     Mental Status: She is alert.    Review of Systems  Psychiatric/Behavioral:  Positive for hallucinations. Negative for depression, substance abuse and suicidal ideas. The patient is nervous/anxious. The patient does not have insomnia.    Blood pressure 123/87, pulse 96, temperature 97.7 F (36.5 C), resp. rate 20, height 5\' 8"  (1.727 m), weight 116.6 kg, SpO2 100%, unknown if currently breastfeeding. Body mass index is 39.08 kg/m.  Diagnosis: Principal Problem:   Schizophrenia (HCC)   PLAN: Safety and Monitoring:  -- Voluntary admission to inpatient psychiatric unit for safety, stabilization and treatment  -- Daily contact with patient to assess and evaluate symptoms and progress in treatment  -- Patient's case to be discussed in multi-disciplinary team meeting  -- Observation Level : q15 minute checks  -- Vital signs:  q12 hours  -- Precautions: suicide, elopement, and assault -- Encouraged patient to participate in unit milieu and in scheduled group therapies  2. Psychiatric Diagnoses and Treatment:         Patient received Invega  Sustenna Aug 07, 2023.  Continue zyprexa  10 mg bid were transition yesterday given on going  psychosis.  Continue Prozac  20 mg daily     -- The risks/benefits/side-effects/alternatives to this medication were discussed in detail with  the patient and time was given for questions. The patient consents to medication trial.                -- Metabolic profile and EKG monitoring obtained while on an atypical antipsychotic (BMI: Lipid Panel: HbgA1c: QTc:)              -- Encouraged patient to participate in unit milieu and in scheduled group therapies      3. Medical Issues Being Addressed:     No urgent medical needs identified at this time  4. Discharge Planning:              -- Social work and case management to assist with discharge planning and identification of hospital follow-up needs prior to discharge             -- Estimated LOS: 5-7 days             -- Discharge Concerns: Need to establish a safety plan; Medication compliance and effectiveness             -- Discharge Goals: Return home with outpatient referrals follow ups  Fay Hoop, PA-C 08/20/2023, 3:08 PM

## 2023-08-21 DIAGNOSIS — F209 Schizophrenia, unspecified: Secondary | ICD-10-CM | POA: Diagnosis not present

## 2023-08-21 NOTE — Group Note (Signed)
 Recreation Therapy Group Note   Group Topic:Other  Group Date: 08/21/2023 Start Time: 1500 End Time: 1600 Facilitators: Deatrice Factor, LRT, CTRS Location: Courtyard  Group Description: [employer]. LRT and patients played games of basketball, drew with chalk, and played corn hole while outside in the courtyard while getting fresh air and sunlight. Music was being played in the background. LRT and peers conversed about different games they have played before, what they do in their free time and anything else that is on their minds. LRT encouraged pts to drink water  after being outside, sweating and getting their heart rate up.  Goal Area(s) Addressed: Patient will build on frustration tolerance skills. Patients will partake in a competitive play game with peers. Patients will gain knowledge of new leisure interest/hobby.    Affect/Mood: N/A   Participation Level: Did not attend    Clinical Observations/Individualized Feedback: Patient did not attend group.   Plan: Continue to engage patient in RT group sessions 2-3x/week.   Deatrice Factor, LRT, CTRS 08/21/2023 4:45 PM

## 2023-08-21 NOTE — Group Note (Signed)
 [facility] LCSW Group Therapy Note   Group Date: 08/21/2023 Start Time: 1300 End Time: 1400  Type of Therapy/Topic:  Group Therapy:  Feelings about Diagnosis  Participation Level:  Did Not Attend   Description of Group:    This group will allow patients to explore their thoughts and feelings about diagnoses they have received. Patients will be guided to explore their level of understanding and acceptance of these diagnoses. Facilitator will encourage patients to process their thoughts and feelings about the reactions of others to their diagnosis, and will guide patients in identifying ways to discuss their diagnosis with significant others in their lives. This group will be process-oriented, with patients participating in exploration of their own experiences as well as giving and receiving support and challenge from other group members.   Therapeutic Goals: 1. Patient will demonstrate understanding of diagnosis as evidence by identifying two or more symptoms of the disorder:  2. Patient will be able to express two feelings regarding the diagnosis 3. Patient will demonstrate ability to communicate their needs through discussion and/or role plays  Summary of Patient Progress: Patient declined to attend group.  Therapeutic Modalities:   Cognitive Behavioral Therapy Brief Therapy Feelings Identification    Larri Ply, LCSW

## 2023-08-21 NOTE — Progress Notes (Signed)
 Patient walked out of room with pants and underwear off. Patient reports she needed new underwear. As this nurse approached, patient turned around and started heading back to her room. Patient re-educated that she cannot walk out of her room without clothes on and that she needed to keep her old clothes on while asking for replacement.  While this nurse was going to get patient requested underwear, towels and washcloth patient again came out of room completely naked. Patient redirected back to room and again re-educated that she cannot come out of the room naked.

## 2023-08-21 NOTE — Progress Notes (Signed)
 [facility] MD Progress Note  08/21/2023 8:21 PM Carly Brooks  MRN:  [id number]   Subjective:  Carly Brooks is a 28 y.o. female admitted: Presented to the ED on 08/15/2023  4:28 PM for suicidal Ideation without a plan. She carries the psychiatric diagnoses of schizophrenia, and depression Patient is admitted to adult psych unit with Q15 min safety monitoring. Multidisciplinary team approach is offered. Medication management; group/milieu therapy is offered. Patient continues to have delusions of grandeur believing she is a god and being able to read minds.    On follow-up patient was found in day room.  They were sitting quietly and not interacting with peers.  They are alert and oriented they are cooperative on exam.  Required as needed Atarax  and trazodone  overnight.  They report stable mood and appetite.  Continue to be withdrawn.  They denied SI and HI.  He denied hallucinations.  They continue to have some issues with impulse control as noted by nursing and walked out of the room without close.  They were able to be redirected easily.    Sleep: Fair  Appetite:  Fair  Past Psychiatric History: see h&P Family History:  Family History  Problem Relation Age of Onset   Diabetes Mother    Social History:  Social History   Substance and Sexual Activity  Alcohol Use Not Currently   Alcohol/week: 2.0 standard drinks of alcohol   Types: 2 Glasses of wine per week     Social History   Substance and Sexual Activity  Drug Use Never    Social History   Socioeconomic History   Marital status: Single    Spouse name: Not on file   Number of children: Not on file   Years of education: Not on file   Highest education level: Not on file  Occupational History   Not on file  Tobacco Use   Smoking status: Never   Smokeless tobacco: Never   Tobacco comments:    "I smoke weed everyday"  Vaping Use   Vaping status: Never Used  Substance and Sexual Activity   Alcohol use:  Not Currently    Alcohol/week: 2.0 standard drinks of alcohol    Types: 2 Glasses of wine per week   Drug use: Never   Sexual activity: Yes    Partners: Male  Other Topics Concern   Not on file  Social History Narrative   Not on file   Social Drivers of Health   Financial Resource Strain: High Risk (05/23/2023)   Overall Financial Resource Strain (CARDIA)    Difficulty of Paying Living Expenses: Very hard  Food Insecurity: Patient Declined (08/17/2023)   Hunger Vital Sign    Worried About Running Out of Food in the Last Year: Patient declined    Ran Out of Food in the Last Year: Patient declined  Recent Concern: Food Insecurity - Food Insecurity Present (07/12/2023)   Hunger Vital Sign    Worried About Running Out of Food in the Last Year: Sometimes true    Ran Out of Food in the Last Year: Sometimes true  Transportation Needs: Patient Declined (08/17/2023)   PRAPARE - Administrator, Civil Service (Medical): Patient declined    Lack of Transportation (Non-Medical): Patient declined  Recent Concern: Transportation Needs - Unmet Transportation Needs (07/12/2023)   PRAPARE - Transportation    Lack of Transportation (Medical): Yes    Lack of Transportation (Non-Medical): Yes  Physical Activity: Inactive (05/23/2023)   Exercise Vital  Sign    Days of Exercise per Week: 0 days    Minutes of Exercise per Session: 0 min  Stress: No Stress Concern Present (05/23/2023)   [employer] of Occupational Health - Occupational Stress Questionnaire    Feeling of Stress : Not at all  Social Connections: Patient Declined (08/17/2023)   Social Connection and Isolation Panel [NHANES]    Frequency of Communication with Friends and Family: Patient declined    Frequency of Social Gatherings with Friends and Family: Patient declined    Attends Religious Services: Patient declined    Database administrator or Organizations: Patient declined    Attends Banker Meetings: Patient  declined    Marital Status: Patient declined  Recent Concern: Social Connections - Moderately Isolated (05/23/2023)   Social Connection and Isolation Panel [NHANES]    Frequency of Communication with Friends and Family: Never    Frequency of Social Gatherings with Friends and Family: Never    Attends Religious Services: More than 4 times per year    Active Member of Clubs or Organizations: Yes    Attends Engineer, structural: More than 4 times per year    Marital Status: Never married   Past Medical History:  Past Medical History:  Diagnosis Date   Anxiety    Cluster B personality disorder (HCC)    Depression    Medical history non-contributory     Past Surgical History:  Procedure Laterality Date   CESAREAN SECTION     CESAREAN SECTION  04/18/2021   CESAREAN SECTION WITH BILATERAL TUBAL LIGATION N/A 04/18/2021   Procedure: CESAREAN SECTION    Current Medications: Current Facility-Administered Medications  Medication Dose Route Frequency Provider Last Rate Last Admin   acetaminophen  (TYLENOL ) tablet 650 mg  650 mg Oral Q6H PRN Motley-Mangrum, Jadeka A, PMHNP       alum & mag hydroxide-simeth (MAALOX/MYLANTA) 200-200-20 MG/5ML suspension 30 mL  30 mL Oral Q4H PRN Motley-Mangrum, Jadeka A, PMHNP       haloperidol  (HALDOL ) tablet 5 mg  5 mg Oral TID PRN Motley-Mangrum, Jadeka A, PMHNP   5 mg at 08/17/23 1706   And   diphenhydrAMINE  (BENADRYL ) capsule 50 mg  50 mg Oral TID PRN Motley-Mangrum, Jadeka A, PMHNP   50 mg at 08/17/23 1706   haloperidol  lactate (HALDOL ) injection 5 mg  5 mg Intramuscular TID PRN Motley-Mangrum, Jadeka A, PMHNP       And   diphenhydrAMINE  (BENADRYL ) injection 50 mg  50 mg Intramuscular TID PRN Motley-Mangrum, Jadeka A, PMHNP       And   LORazepam  (ATIVAN ) injection 2 mg  2 mg Intramuscular TID PRN Motley-Mangrum, Jadeka A, PMHNP       haloperidol  lactate (HALDOL ) injection 10 mg  10 mg Intramuscular TID PRN Motley-Mangrum, Jadeka A, PMHNP        And   diphenhydrAMINE  (BENADRYL ) injection 50 mg  50 mg Intramuscular TID PRN Motley-Mangrum, Jadeka A, PMHNP       And   LORazepam  (ATIVAN ) injection 2 mg  2 mg Intramuscular TID PRN Motley-Mangrum, Jadeka A, PMHNP       FLUoxetine  (PROZAC ) capsule 20 mg  20 mg Oral Daily Motley-Mangrum, Jadeka A, PMHNP   20 mg at 08/21/23 0802   hydrOXYzine  (ATARAX ) tablet 25 mg  25 mg Oral Q8H PRN Motley-Mangrum, Jadeka A, PMHNP   25 mg at 08/20/23 2118   magnesium  hydroxide (MILK OF MAGNESIA) suspension 30 mL  30 mL Oral Daily PRN Motley-Mangrum,  Jadeka A, PMHNP       nicotine  (NICODERM CQ  - dosed in mg/24 hours) patch 21 mg  21 mg Transdermal Daily Motley-Mangrum, Jadeka A, PMHNP   21 mg at 08/21/23 0802   OLANZapine  (ZYPREXA ) tablet 10 mg  10 mg Oral BID Shrivastava, Aryendra, MD   10 mg at 08/21/23 1723   traZODone  (DESYREL ) tablet 50 mg  50 mg Oral QHS PRN Motley-Mangrum, Jadeka A, PMHNP   50 mg at 08/20/23 2118    Lab Results: No results found for this or any previous visit (from the past 48 hours).  Blood Alcohol level:  Lab Results  Component Value Date   [facility] <15 08/15/2023   ETH <15 07/28/2023    Metabolic Disorder Labs: Lab Results  Component Value Date   HGBA1C 5.0 07/28/2023   MPG 96.8 07/28/2023   MPG 93.93 06/09/2023   Lab Results  Component Value Date   PROLACTIN 14.2 07/12/2023   Lab Results  Component Value Date   CHOL 146 07/28/2023   TRIG 48 07/28/2023   HDL 41 07/28/2023   CHOLHDL 3.6 07/28/2023   VLDL 10 07/28/2023   LDLCALC 95 07/28/2023   LDLCALC 101 (H) 06/09/2023    Physical Findings: AIMS:  , ,  ,  ,    CIWA:    COWS:      Psychiatric Specialty Exam:  Presentation  General Appearance:  Casual  Eye Contact: Fleeting  Speech: Clear and Coherent  Speech Volume: Normal    Mood and Affect  Mood: Euthymic  Affect: Flat   Thought Process  Thought Processes: Linear  Descriptions of Associations:Intact  Orientation:Full (Time, Place  and Person)  Thought Content:Paranoid Ideation  Hallucinations:Hallucinations: None  Ideas of Reference:None  Suicidal Thoughts:Suicidal Thoughts: No  Homicidal Thoughts:Homicidal Thoughts: No   Sensorium  Memory: Immediate Fair  Judgment: Poor  Insight: Shallow   Executive Functions  Concentration: Poor  Attention Span: Poor  Recall: [employer] of Knowledge: Fair  Language: Fair   Psychomotor Activity  Psychomotor Activity: Psychomotor Activity: Normal  Musculoskeletal: Strength & Muscle Tone: within normal limits Gait & Station: normal Assets  Assets: Manufacturing systems engineer; Desire for Improvement    Physical Exam: Physical Exam HENT:     Head: Atraumatic.  Eyes:     Extraocular Movements: Extraocular movements intact.  Pulmonary:     Effort: Pulmonary effort is normal.  Neurological:     Mental Status: She is alert.    Review of Systems  Psychiatric/Behavioral:  Negative for depression, hallucinations, substance abuse and suicidal ideas. The patient is nervous/anxious. The patient does not have insomnia.    Blood pressure 102/74, pulse 100, temperature 98.1 F (36.7 C), resp. rate 20, height 5\' 8"  (1.727 m), weight 116.6 kg, SpO2 100%, unknown if currently breastfeeding. Body mass index is 39.08 kg/m.  Diagnosis: Principal Problem:   Schizophrenia (HCC)   PLAN: Safety and Monitoring:  -- Voluntary admission to inpatient psychiatric unit for safety, stabilization and treatment  -- Daily contact with patient to assess and evaluate symptoms and progress in treatment  -- Patient's case to be discussed in multi-disciplinary team meeting  -- Observation Level : q15 minute checks  -- Vital signs:  q12 hours  -- Precautions: suicide, elopement, and assault -- Encouraged patient to participate in unit milieu and in scheduled group therapies  2. Psychiatric Diagnoses and Treatment:  Patient was transition to twice daily dosing of Zyprexa   and appears to be tolerating well with a continued head impulse  control issues will continue to titrate medications.  Per previous notes and review of collateral patient appears to meet nearing baseline.       Patient received Invega  Sustenna Aug 07, 2023.  Continue zyprexa  10 mg bid   Continue Prozac  20 mg daily     -- The risks/benefits/side-effects/alternatives to this medication were discussed in detail with the patient and time was given for questions. The patient consents to medication trial.                -- Metabolic profile and EKG monitoring obtained while on an atypical antipsychotic (BMI: Lipid Panel: HbgA1c: QTc:)              -- Encouraged patient to participate in unit milieu and in scheduled group therapies      3. Medical Issues Being Addressed:     No urgent medical needs identified at this time  4. Discharge Planning:              -- Social work and case management to assist with discharge planning and identification of hospital follow-up needs prior to discharge             -- Estimated LOS: 5-7 days             -- Discharge Concerns: Need to establish a safety plan; Medication compliance and effectiveness             -- Discharge Goals: Return home with outpatient referrals follow ups  Fay Hoop, PA-C 08/21/2023, 8:21 PM

## 2023-08-21 NOTE — Group Note (Signed)
 Recreation Therapy Group Note   Group Topic:Coping Skills  Group Date: 08/21/2023 Start Time: 1000 End Time: 1050 Facilitators: Deatrice Factor, LRT, CTRS Location: Craft Room  Group Description: Mind Map.  Patient was provided a blank template of a diagram with 32 blank boxes in a tiered system, branching from the center (similar to a bubble chart). LRT directed patients to label the middle of the diagram "Coping Skills". LRT and patients then came up with 8 different coping skills as examples. Pt were directed to record their coping skills in the 2nd tier boxes closest to the center.  Patients would then share their coping skills with the group as LRT wrote them out. LRT gave a handout of 99 different coping skills at the end of group.   Goal Area(s) Addressed: Patients will be able to define "coping skills". Patient will identify new coping skills.  Patient will increase communication.   Affect/Mood: Appropriate   Participation Level: Moderate   Participation Quality: Independent   Behavior: Calm and Cooperative   Speech/Thought Process: Coherent   Insight: Fair   Judgement: Fair    Modes of Intervention: Education, Exploration, Worksheet, and Writing   Patient Response to Interventions:  Attentive, Engaged, Interested , and Receptive   Education Outcome:  Acknowledges education   Clinical Observations/Individualized Feedback: Carly Brooks was mostly active in their participation of session activities and group discussion. Pt identified "writing and talking" as coping skills. Pt left group early. Pt minimally interacted with LRT and peers while present.    Plan: Continue to engage patient in RT group sessions 2-3x/week.   Deatrice Factor, LRT, CTRS 08/21/2023 11:11 AM

## 2023-08-21 NOTE — Plan of Care (Signed)
   Problem: Education: Goal: Knowledge of [place] [facility] Education information/materials will improve Outcome: Progressing Goal: Emotional status will improve Outcome: Progressing Goal: Mental status will improve Outcome: Progressing

## 2023-08-21 NOTE — Group Note (Signed)
 Date:  08/21/2023 Time:  8:40 PM  Group Topic/Focus:  Wrap-Up Group:   The focus of this group is to help patients review their daily goal of treatment and discuss progress on daily workbooks.    Participation Level:  Minimal  Participation Quality:  Appropriate  Affect:  Appropriate  Cognitive:  Appropriate  Insight: Appropriate  Engagement in Group:  None  Modes of Intervention:  Discussion  Additional Comments:     Fabiola Holy 08/21/2023, 8:40 PM

## 2023-08-21 NOTE — BH Assessment (Signed)
 Patient denies SI,HI and AVH. Patient is pleasant and coopertive. Took PM medications.

## 2023-08-21 NOTE — Progress Notes (Signed)
   08/21/23 0925  Psych Admission Type (Psych Patients Only)  Admission Status Voluntary  Psychosocial Assessment  Patient Complaints None  Eye Contact Brief  Facial Expression Flat  Affect Flat  Speech Soft;Slow  Interaction Isolative;Attention-seeking (Patient isolates to her room the majority of the day. Patient will intermittently come out of her room naked and will require redirection to go back to her room and put clothes on.)  Motor Activity Slow  Appearance/Hygiene Unremarkable  Behavior Characteristics Cooperative  Mood Preoccupied  Thought Process  Coherency Blocking  Content Preoccupation;Paranoia  Delusions None reported or observed  Perception WDL  Hallucination None reported or observed  Judgment Poor  Confusion Mild  Danger to Self  Current suicidal ideation? Denies  Self-Injurious Behavior No self-injurious ideation or behavior indicators observed or expressed   Danger to Others  Danger to Others None reported or observed

## 2023-08-21 NOTE — Group Note (Signed)
 Date:  08/21/2023 Time:  6:08 PM  Group Topic/Focus:  Activity Group: The focus of this group is to promote activity for the patients and to encourage them to go outside to the courtyard and get some fresh air and some exercise.    Participation Level:  Did Not Attend  Maxine Spare Giordan Fordham 08/21/2023, 6:08 PM

## 2023-08-21 NOTE — Plan of Care (Signed)

## 2023-08-22 DIAGNOSIS — F209 Schizophrenia, unspecified: Secondary | ICD-10-CM | POA: Diagnosis not present

## 2023-08-22 NOTE — Plan of Care (Signed)
   Problem: Activity: Goal: Interest or engagement in activities will improve Outcome: Progressing Goal: Sleeping patterns will improve Outcome: Progressing

## 2023-08-22 NOTE — Group Note (Signed)
 [facility] LCSW Group Therapy Note   Group Date: 08/22/2023 Start Time: 1300 End Time: 1400   Type of Therapy and Topic: Group Therapy: Avoiding Self-Sabotaging and Enabling Behaviors  Participation Level: Did Not Attend  Mood:  Description of Group:  In this group, patients will learn how to identify obstacles, self-sabotaging and enabling behaviors, as well as: what are they, why do we do them and what needs these behaviors meet. Discuss unhealthy relationships and how to have positive healthy boundaries with those that sabotage and enable. Explore aspects of self-sabotage and enabling in yourself and how to limit these self-destructive behaviors in everyday life.   Therapeutic Goals: 1. Patient will identify one obstacle that relates to self-sabotage and enabling behaviors 2. Patient will identify one personal self-sabotaging or enabling behavior they did prior to admission 3. Patient will state a plan to change the above identified behavior 4. Patient will demonstrate ability to communicate their needs through discussion and/or role play.    Summary of Patient Progress:   Patient did not attend group.    Therapeutic Modalities:  Cognitive Behavioral Therapy Person-Centered Therapy Motivational Interviewing    Roselle Conner, LCSW

## 2023-08-22 NOTE — Progress Notes (Signed)
 Pt intercepted and walked to room by Carly Brooks MHT when she was observed leaving her room unclothed except for her underpants.  Only moments later she was observed leaving room again with only her underpants on.

## 2023-08-22 NOTE — [facility] Group Notes (Signed)
 [facility] Group Notes:  (Nursing/MHT/Case Management/Adjunct)  Date:  08/22/2023  Time:  2000  Type of Therapy:  Wrap up group  Participation Level:  Active  Participation Quality:  Attentive, Sharing, and Supportive  Affect:  Blunted  Cognitive:  Alert  Insight:  Improving  Engagement in Group:  Developing/Improving  Modes of Intervention:  Clarification, Education, and Support  Summary of Progress/Problems: Positive thinking and positive change were discussed.   Catharine Clock 08/22/2023, 9:18 PM

## 2023-08-22 NOTE — Plan of Care (Signed)
   Problem: Education: Goal: Knowledge of [place] [facility] Education information/materials will improve Outcome: Progressing Goal: Emotional status will improve Outcome: Progressing Goal: Mental status will improve Outcome: Progressing Goal: Verbalization of understanding the information provided will improve Outcome: Progressing   Problem: Activity: Goal: Interest or engagement in activities will improve Outcome: Progressing Goal: Sleeping patterns will improve Outcome: Progressing   Problem: Coping: Goal: Ability to verbalize frustrations and anger appropriately will improve Outcome: Progressing Goal: Ability to demonstrate self-control will improve Outcome: Progressing   Problem: Health Behavior/Discharge Planning: Goal: Identification of resources available to assist in meeting health care needs will improve Outcome: Progressing Goal: Compliance with treatment plan for underlying cause of condition will improve Outcome: Progressing   Problem: Physical Regulation: Goal: Ability to maintain clinical measurements within normal limits will improve Outcome: Progressing   Problem: Safety: Goal: Periods of time without injury will increase Outcome: Progressing   Problem: Activity: Goal: Will verbalize the importance of balancing activity with adequate rest periods Outcome: Progressing   Problem: Education: Goal: Will be free of psychotic symptoms Outcome: Progressing Goal: Knowledge of the prescribed therapeutic regimen will improve Outcome: Progressing   Problem: Coping: Goal: Coping ability will improve Outcome: Progressing Goal: Will verbalize feelings Outcome: Progressing   Problem: Health Behavior/Discharge Planning: Goal: Compliance with prescribed medication regimen will improve Outcome: Progressing   Problem: Nutritional: Goal: Ability to achieve adequate nutritional intake will improve Outcome: Progressing   Problem: Role Relationship: Goal:  Ability to communicate needs accurately will improve Outcome: Progressing Goal: Ability to interact with others will improve Outcome: Progressing   Problem: Safety: Goal: Ability to redirect hostility and anger into socially appropriate behaviors will improve Outcome: Progressing Goal: Ability to remain free from injury will improve Outcome: Progressing   Problem: Self-Care: Goal: Ability to participate in self-care as condition permits will improve Outcome: Progressing   Problem: Self-Concept: Goal: Will verbalize positive feelings about self Outcome: Progressing

## 2023-08-22 NOTE — Progress Notes (Signed)
   08/22/23 2100  Psych Admission Type (Psych Patients Only)  Admission Status Voluntary  Psychosocial Assessment  Patient Complaints None  Eye Contact Fair  Facial Expression Flat  Affect Flat  Speech Logical/coherent  Interaction Avoidant  Motor Activity Slow  Appearance/Hygiene Unremarkable  Behavior Characteristics Cooperative  Mood Depressed  Aggressive Behavior  Effect No apparent injury  Thought Process  Coherency WDL  Delusions None reported or observed  Perception WDL  Hallucination None reported or observed  Judgment Poor  Confusion Mild  Danger to Self  Current suicidal ideation? Denies  Self-Injurious Behavior No self-injurious ideation or behavior indicators observed or expressed   Agreement Not to Harm Self Yes  Danger to Others  Danger to Others None reported or observed

## 2023-08-22 NOTE — BH Assessment (Signed)
 Patient denies si,hi and avh. Patient is atteninon seeking and requires being re directed. Patinet had reports of being anxious and not feel good about herself so she walks out of her room naked. Patient did recive haldol  and bendery for mild agition. Patient remains safe on the unit q15 saftery checks at this time.

## 2023-08-22 NOTE — Group Note (Signed)
 Date:  08/22/2023 Time:  11:14 AM  Group Topic/Focus:  Goals Group:   The focus of this group is to help patients establish daily goals to achieve during treatment and discuss how the patient can incorporate goal setting into their daily lives to aide in recovery.    Participation Level:  Did Not Attend   Mellie Sprinkle [facility] 08/22/2023, 11:14 AM

## 2023-08-22 NOTE — Progress Notes (Signed)
 [facility] MD Progress Note  08/22/2023 10:52 AM Carly Brooks  MRN:  [id number]   Subjective:  Carly Brooks is a 28 y.o. female admitted: Presented to the ED on 08/15/2023  4:28 PM for suicidal Ideation without a plan. She carries the psychiatric diagnoses of schizophrenia, and depression Patient is admitted to adult psych unit with Q15 min safety monitoring. Multidisciplinary team approach is offered. Medication management; group/milieu therapy is offered. Patient continues to have delusions of grandeur believing she is a god and being able to read minds.    On follow-up patient was found in their room they were sleeping but wake easily. They are alert and oriented x 3. They note stable appetite and sleep. Have some continued anxiety. Denied SI hi AVH. Have some continued thoughts that the police are out to get her. Is optimistic about going home and see her toddlers. Prefer the twice a day dosing of zyprexa . Denies adverse effects of meds, feels they are near baseline.     Sleep: Fair  Appetite:  Fair  Past Psychiatric History: see h&P Family History:  Family History  Problem Relation Age of Onset   Diabetes Mother    Social History:  Social History   Substance and Sexual Activity  Alcohol Use Not Currently   Alcohol/week: 2.0 standard drinks of alcohol   Types: 2 Glasses of wine per week     Social History   Substance and Sexual Activity  Drug Use Never    Social History   Socioeconomic History   Marital status: Single    Spouse name: Not on file   Number of children: Not on file   Years of education: Not on file   Highest education level: Not on file  Occupational History   Not on file  Tobacco Use   Smoking status: Never   Smokeless tobacco: Never   Tobacco comments:    "I smoke weed everyday"  Vaping Use   Vaping status: Never Used  Substance and Sexual Activity   Alcohol use: Not Currently    Alcohol/week: 2.0 standard drinks of alcohol     Types: 2 Glasses of wine per week   Drug use: Never   Sexual activity: Yes    Partners: Male  Other Topics Concern   Not on file  Social History Narrative   Not on file   Social Drivers of Health   Financial Resource Strain: High Risk (05/23/2023)   Overall Financial Resource Strain (CARDIA)    Difficulty of Paying Living Expenses: Very hard  Food Insecurity: Patient Declined (08/17/2023)   Hunger Vital Sign    Worried About Running Out of Food in the Last Year: Patient declined    Ran Out of Food in the Last Year: Patient declined  Recent Concern: Food Insecurity - Food Insecurity Present (07/12/2023)   Hunger Vital Sign    Worried About Running Out of Food in the Last Year: Sometimes true    Ran Out of Food in the Last Year: Sometimes true  Transportation Needs: Patient Declined (08/17/2023)   PRAPARE - Administrator, Civil Service (Medical): Patient declined    Lack of Transportation (Non-Medical): Patient declined  Recent Concern: Transportation Needs - Unmet Transportation Needs (07/12/2023)   PRAPARE - Transportation    Lack of Transportation (Medical): Yes    Lack of Transportation (Non-Medical): Yes  Physical Activity: Inactive (05/23/2023)   Exercise Vital Sign    Days of Exercise per Week: 0 days  Minutes of Exercise per Session: 0 min  Stress: No Stress Concern Present (05/23/2023)   [employer] of Occupational Health - Occupational Stress Questionnaire    Feeling of Stress : Not at all  Social Connections: Patient Declined (08/17/2023)   Social Connection and Isolation Panel [NHANES]    Frequency of Communication with Friends and Family: Patient declined    Frequency of Social Gatherings with Friends and Family: Patient declined    Attends Religious Services: Patient declined    Database administrator or Organizations: Patient declined    Attends Banker Meetings: Patient declined    Marital Status: Patient declined  Recent Concern:  Social Connections - Moderately Isolated (05/23/2023)   Social Connection and Isolation Panel [NHANES]    Frequency of Communication with Friends and Family: Never    Frequency of Social Gatherings with Friends and Family: Never    Attends Religious Services: More than 4 times per year    Active Member of [employer] or Organizations: Yes    Attends Engineer, structural: More than 4 times per year    Marital Status: Never married   Past Medical History:  Past Medical History:  Diagnosis Date   Anxiety    Cluster B personality disorder (HCC)    Depression    Medical history non-contributory     Past Surgical History:  Procedure Laterality Date   CESAREAN SECTION     CESAREAN SECTION  04/18/2021   CESAREAN SECTION WITH BILATERAL TUBAL LIGATION N/A 04/18/2021   Procedure: CESAREAN SECTION    Current Medications: Current Facility-Administered Medications  Medication Dose Route Frequency Provider Last Rate Last Admin   acetaminophen  (TYLENOL ) tablet 650 mg  650 mg Oral Q6H PRN Motley-Mangrum, Jadeka A, PMHNP       alum & mag hydroxide-simeth (MAALOX/MYLANTA) 200-200-20 MG/5ML suspension 30 mL  30 mL Oral Q4H PRN Motley-Mangrum, Jadeka A, PMHNP       haloperidol  (HALDOL ) tablet 5 mg  5 mg Oral TID PRN Motley-Mangrum, Jadeka A, PMHNP   5 mg at 08/21/23 2041   And   diphenhydrAMINE  (BENADRYL ) capsule 50 mg  50 mg Oral TID PRN Motley-Mangrum, Jadeka A, PMHNP   50 mg at 08/21/23 2040   haloperidol  lactate (HALDOL ) injection 5 mg  5 mg Intramuscular TID PRN Motley-Mangrum, Jadeka A, PMHNP       And   diphenhydrAMINE  (BENADRYL ) injection 50 mg  50 mg Intramuscular TID PRN Motley-Mangrum, Jadeka A, PMHNP       And   LORazepam  (ATIVAN ) injection 2 mg  2 mg Intramuscular TID PRN Motley-Mangrum, Jadeka A, PMHNP       haloperidol  lactate (HALDOL ) injection 10 mg  10 mg Intramuscular TID PRN Motley-Mangrum, Jadeka A, PMHNP       And   diphenhydrAMINE  (BENADRYL ) injection 50 mg  50 mg  Intramuscular TID PRN Motley-Mangrum, Jadeka A, PMHNP       And   LORazepam  (ATIVAN ) injection 2 mg  2 mg Intramuscular TID PRN Motley-Mangrum, Jadeka A, PMHNP       FLUoxetine  (PROZAC ) capsule 20 mg  20 mg Oral Daily Motley-Mangrum, Jadeka A, PMHNP   20 mg at 08/22/23 0900   hydrOXYzine  (ATARAX ) tablet 25 mg  25 mg Oral Q8H PRN Motley-Mangrum, Jadeka A, PMHNP   25 mg at 08/21/23 2040   magnesium  hydroxide (MILK OF MAGNESIA) suspension 30 mL  30 mL Oral Daily PRN Motley-Mangrum, Jadeka A, PMHNP       nicotine  (NICODERM CQ  - dosed  in mg/24 hours) patch 21 mg  21 mg Transdermal Daily Motley-Mangrum, Jadeka A, PMHNP   21 mg at 08/22/23 1478   OLANZapine  (ZYPREXA ) tablet 10 mg  10 mg Oral BID Shrivastava, Aryendra, MD   10 mg at 08/22/23 0900   traZODone  (DESYREL ) tablet 50 mg  50 mg Oral QHS PRN Motley-Mangrum, Jadeka A, PMHNP   50 mg at 08/21/23 2040    Lab Results: No results found for this or any previous visit (from the past 48 hours).  Blood Alcohol level:  Lab Results  Component Value Date   [facility] <15 08/15/2023   ETH <15 07/28/2023    Metabolic Disorder Labs: Lab Results  Component Value Date   HGBA1C 5.0 07/28/2023   MPG 96.8 07/28/2023   MPG 93.93 06/09/2023   Lab Results  Component Value Date   PROLACTIN 14.2 07/12/2023   Lab Results  Component Value Date   CHOL 146 07/28/2023   TRIG 48 07/28/2023   HDL 41 07/28/2023   CHOLHDL 3.6 07/28/2023   VLDL 10 07/28/2023   LDLCALC 95 07/28/2023   LDLCALC 101 (H) 06/09/2023    Physical Findings: AIMS:  , ,  ,  ,    CIWA:    COWS:      Psychiatric Specialty Exam:  Presentation  General Appearance:  Casual  Eye Contact: Fair  Speech: Clear and Coherent  Speech Volume: Normal    Mood and Affect  Mood: Euthymic  Affect: Congruent   Thought Process  Thought Processes: Coherent  Descriptions of Associations:Intact  Orientation:Full (Time, Place and Person)  Thought Content:Paranoid  Ideation  Hallucinations:Hallucinations: None  Ideas of Reference:None  Suicidal Thoughts:Suicidal Thoughts: No  Homicidal Thoughts:Homicidal Thoughts: No   Sensorium  Memory: Immediate Fair; Recent Fair  Judgment: Poor  Insight: Shallow   Executive Functions  Concentration: Poor  Attention Span: Poor  Recall: [employer] of Knowledge: Fair  Language: Fair   Psychomotor Activity  Psychomotor Activity: Psychomotor Activity: Normal  Musculoskeletal: Strength & Muscle Tone: within normal limits Gait & Station: normal Assets  Assets: Manufacturing systems engineer; Desire for Improvement    Physical Exam: Physical Exam HENT:     Head: Atraumatic.  Eyes:     Extraocular Movements: Extraocular movements intact.  Pulmonary:     Effort: Pulmonary effort is normal.  Neurological:     Mental Status: She is alert.    Review of Systems  Psychiatric/Behavioral:  Negative for depression, hallucinations, substance abuse and suicidal ideas. The patient is nervous/anxious. The patient does not have insomnia.    Blood pressure 107/79, pulse 80, temperature 97.9 F (36.6 C), resp. rate 20, height 5\' 8"  (1.727 m), weight 116.6 kg, SpO2 100%, unknown if currently breastfeeding. Body mass index is 39.08 kg/m.  Diagnosis: Principal Problem:   Schizophrenia (HCC)   PLAN: Safety and Monitoring:  -- Voluntary admission to inpatient psychiatric unit for safety, stabilization and treatment  -- Daily contact with patient to assess and evaluate symptoms and progress in treatment  -- Patient's case to be discussed in multi-disciplinary team meeting  -- Observation Level : q15 minute checks  -- Vital signs:  q12 hours  -- Precautions: suicide, elopement, and assault -- Encouraged patient to participate in unit milieu and in scheduled group therapies  2. Psychiatric Diagnoses and Treatment:  Patient was transition to twice daily dosing of Zyprexa  and appears to be tolerating  well with a continued head impulse control issues will continue to titrate medications.  Per previous notes and  review of collateral patient appears to meet nearing baseline.       Patient received Invega  Sustenna Aug 07, 2023.  Continue zyprexa  10 mg bid   Continue Prozac  20 mg daily    - received prn for agitation over night they are nearing baseline but require continued monitoring.  -- The risks/benefits/side-effects/alternatives to this medication were discussed in detail with the patient and time was given for questions. The patient consents to medication trial.                -- Metabolic profile and EKG monitoring obtained while on an atypical antipsychotic (BMI: Lipid Panel: HbgA1c: QTc:)              -- Encouraged patient to participate in unit milieu and in scheduled group therapies      3. Medical Issues Being Addressed:     No urgent medical needs identified at this time  4. Discharge Planning:   -- follow up with family plan is for pt to discharge home per review of collateral pt appears near baseline             -- Social work and case management to assist with discharge planning and identification of hospital follow-up needs prior to discharge             -- Estimated LOS: 5-7 days             -- Discharge Concerns: Need to establish a safety plan; Medication compliance and effectiveness             -- Discharge Goals: Return home with outpatient referrals follow ups  Fay Hoop, PA-C 08/22/2023, 10:52 AM

## 2023-08-22 NOTE — Group Note (Signed)
 Date:  08/22/2023 Time:  5:52 PM  Group Topic/Focus:  Wellness Toolbox:   The focus of this group is to discuss various aspects of wellness, balancing those aspects and exploring ways to increase the ability to experience wellness.  Patients will create a wellness toolbox for use upon discharge.    Participation Level:  Did Not Attend   Carly Brooks [facility] 08/22/2023, 5:52 PM

## 2023-08-22 NOTE — BH IP Treatment Plan (Signed)
 Interdisciplinary Treatment and Diagnostic Plan Update  08/22/2023 Time of Session: 9:00 AM Carly Brooks MRN: [id number]  Principal Diagnosis: Schizophrenia [facility])  Secondary Diagnoses: Principal Problem:   Schizophrenia (HCC)   Current Medications:  Current Facility-Administered Medications  Medication Dose Route Frequency Provider Last Rate Last Admin   acetaminophen  (TYLENOL ) tablet 650 mg  650 mg Oral Q6H PRN Motley-Mangrum, Jadeka A, PMHNP       alum & mag hydroxide-simeth (MAALOX/MYLANTA) 200-200-20 MG/5ML suspension 30 mL  30 mL Oral Q4H PRN Motley-Mangrum, Jadeka A, PMHNP       haloperidol  (HALDOL ) tablet 5 mg  5 mg Oral TID PRN Motley-Mangrum, Jadeka A, PMHNP   5 mg at 08/21/23 2041   And   diphenhydrAMINE  (BENADRYL ) capsule 50 mg  50 mg Oral TID PRN Motley-Mangrum, Jadeka A, PMHNP   50 mg at 08/21/23 2040   haloperidol  lactate (HALDOL ) injection 5 mg  5 mg Intramuscular TID PRN Motley-Mangrum, Jadeka A, PMHNP       And   diphenhydrAMINE  (BENADRYL ) injection 50 mg  50 mg Intramuscular TID PRN Motley-Mangrum, Jadeka A, PMHNP       And   LORazepam  (ATIVAN ) injection 2 mg  2 mg Intramuscular TID PRN Motley-Mangrum, Jadeka A, PMHNP       haloperidol  lactate (HALDOL ) injection 10 mg  10 mg Intramuscular TID PRN Motley-Mangrum, Jadeka A, PMHNP       And   diphenhydrAMINE  (BENADRYL ) injection 50 mg  50 mg Intramuscular TID PRN Motley-Mangrum, Jadeka A, PMHNP       And   LORazepam  (ATIVAN ) injection 2 mg  2 mg Intramuscular TID PRN Motley-Mangrum, Jadeka A, PMHNP       FLUoxetine  (PROZAC ) capsule 20 mg  20 mg Oral Daily Motley-Mangrum, Jadeka A, PMHNP   20 mg at 08/22/23 0900   hydrOXYzine  (ATARAX ) tablet 25 mg  25 mg Oral Q8H PRN Motley-Mangrum, Jadeka A, PMHNP   25 mg at 08/21/23 2040   magnesium  hydroxide (MILK OF MAGNESIA) suspension 30 mL  30 mL Oral Daily PRN Motley-Mangrum, Jadeka A, PMHNP       nicotine  (NICODERM CQ  - dosed in mg/24 hours) patch 21 mg  21 mg  Transdermal Daily Motley-Mangrum, Jadeka A, PMHNP   21 mg at 08/22/23 4696   OLANZapine  (ZYPREXA ) tablet 10 mg  10 mg Oral BID Shrivastava, Aryendra, MD   10 mg at 08/22/23 0900   traZODone  (DESYREL ) tablet 50 mg  50 mg Oral QHS PRN Motley-Mangrum, Jadeka A, PMHNP   50 mg at 08/21/23 2040   PTA Medications: Medications Prior to Admission  Medication Sig Dispense Refill Last Dose/Taking   ARIPiprazole  (ABILIFY ) 20 MG tablet Take 20 mg by mouth daily. (Patient not taking: Reported on 08/16/2023)      famotidine (PEPCID) 20 MG tablet Take 20 mg by mouth daily as needed for heartburn. (Patient not taking: Reported on 08/16/2023)      FLUoxetine  (PROZAC ) 20 MG capsule Take 1 capsule (20 mg total) by mouth daily. (Patient not taking: Reported on 08/16/2023) 30 capsule 0    hydrOXYzine  (VISTARIL ) 25 MG capsule Take 25 mg by mouth every 6 (six) hours as needed. (Patient not taking: Reported on 08/16/2023)      OLANZapine  (ZYPREXA ) 10 MG tablet Take 10 mg by mouth at bedtime. (Patient not taking: Reported on 08/16/2023)      paliperidone  (INVEGA  SUSTENNA) 156 MG/ML SUSY injection Inject 1 mL (156 mg total) into the muscle every 28 (twenty-eight) days. Patient last received on  07/10/23 and is due for next LAI maintenance dose of 156 mg on 08/07/23 (Patient not taking: Reported on 08/16/2023) 1 mL 2    QUEtiapine  (SEROQUEL ) 200 MG tablet Take 200 mg by mouth at bedtime. (Patient not taking: Reported on 08/16/2023)      risperiDONE  (RISPERDAL ) 3 MG tablet Take 3 mg by mouth 2 (two) times daily. (Patient not taking: Reported on 08/16/2023)      traZODone  (DESYREL ) 50 MG tablet Take 50 mg by mouth at bedtime as needed for sleep. (Patient not taking: Reported on 08/16/2023)       Patient Stressors:    Patient Strengths:    Treatment Modalities: Medication Management, Group therapy, Case management,  1 to 1 session with clinician, Psychoeducation, Recreational therapy.   Physician Treatment Plan for Primary  Diagnosis: Schizophrenia (HCC) Long Term Goal(s): Improvement in symptoms so as ready for discharge   Short Term Goals: Ability to identify changes in lifestyle to reduce recurrence of condition will improve Ability to verbalize feelings will improve Ability to disclose and discuss suicidal ideas Ability to demonstrate self-control will improve Ability to identify and develop effective coping behaviors will improve  Medication Management: Evaluate patient's response, side effects, and tolerance of medication regimen.  Therapeutic Interventions: 1 to 1 sessions, Unit Group sessions and Medication administration.  Evaluation of Outcomes: Not Met  Physician Treatment Plan for Secondary Diagnosis: Principal Problem:   Schizophrenia (HCC)  Long Term Goal(s): Improvement in symptoms so as ready for discharge   Short Term Goals: Ability to identify changes in lifestyle to reduce recurrence of condition will improve Ability to verbalize feelings will improve Ability to disclose and discuss suicidal ideas Ability to demonstrate self-control will improve Ability to identify and develop effective coping behaviors will improve     Medication Management: Evaluate patient's response, side effects, and tolerance of medication regimen.  Therapeutic Interventions: 1 to 1 sessions, Unit Group sessions and Medication administration.  Evaluation of Outcomes: Not Met   RN Treatment Plan for Primary Diagnosis: Schizophrenia (HCC) Long Term Goal(s): Knowledge of disease and therapeutic regimen to maintain health will improve  Short Term Goals: Ability to verbalize frustration and anger appropriately will improve, Ability to demonstrate self-control, Ability to participate in decision making will improve, Ability to verbalize feelings will improve, Ability to disclose and discuss suicidal ideas, Ability to identify and develop effective coping behaviors will improve, and Compliance with prescribed  medications will improve   Medication Management: RN will administer medications as ordered by provider, will assess and evaluate patient's response and provide education to patient for prescribed medication. RN will report any adverse and/or side effects to prescribing provider.  Therapeutic Interventions: 1 on 1 counseling sessions, Psychoeducation, Medication administration, Evaluate responses to treatment, Monitor vital signs and CBGs as ordered, Perform/monitor CIWA, COWS, AIMS and Fall Risk screenings as ordered, Perform wound care treatments as ordered.  Evaluation of Outcomes: Not Met   LCSW Treatment Plan for Primary Diagnosis: Schizophrenia (HCC) Long Term Goal(s): Safe transition to appropriate next level of care at discharge, Engage patient in therapeutic group addressing interpersonal concerns.  Short Term Goals: Engage patient in aftercare planning with referrals and resources, Increase social support, Increase ability to appropriately verbalize feelings, Increase emotional regulation, Facilitate acceptance of mental health diagnosis and concerns, Facilitate patient progression through stages of change regarding substance use diagnoses and concerns, Identify triggers associated with mental health/substance abuse issues, and Increase skills for wellness and recovery   Therapeutic Interventions: Assess for all discharge needs,  1 to 1 time with Child psychotherapist, Explore available resources and support systems, Assess for adequacy in community support network, Educate family and significant other(s) on suicide prevention, Complete Psychosocial Assessment, Interpersonal group therapy.  Evaluation of Outcomes: Not Met   Progress in Treatment: Attending groups: No. 08/22/23 Update: No.  Participating in groups: No. 08/22/23 Update: No.  Taking medication as prescribed: Yes. 08/22/23 Update: Yes.  Toleration medication: Yes. 08/22/23 Update: Yes.  Family/Significant other contact made: No,  will contact:  CSW to contact once permission is granted. 08/22/23 Update: Yes, Education Completed; [name], [phone], Partner,  has been identified by the patient as the family member/significant other with whom the patient will be residing, and identified as the person(s) who will aid the patient in the event of a mental health crisis (suicidal ideations/suicide attempt).  With written consent from the patient, the family member/significant other has been provided the following suicide prevention education, prior to the and/or following the discharge of the patient.  Patient understands diagnosis: Yes. 08/22/23 Update: Yes. Discussing patient identified problems/goals with staff: Yes. 08/22/23 Update: Yes. Medical problems stabilized or resolved: Yes. 08/22/23 Update: Yes. Denies suicidal/homicidal ideation: Yes. 08/22/23 Update: Yes. Issues/concerns per patient self-inventory: No. 08/22/23 Update: No.  Other: None 08/22/23 Update: None.   New Short Term/Long Term Goal(s):detox, elimination of symptoms of psychosis, medication management for mood stabilization; elimination of SI thoughts; development of comprehensive mental wellness/sobriety plan. 08/22/23 Update: Goal to remain the same.    Patient Goals:  "Be less paranoid so I can go out into the world." 08/22/23 Update: Patient goal to remain the same.                                Discharge Plan or Barriers: CSW to assist with the development of appropriate discharge plan. 08/22/23 Update: CSW submitted [facility] referral on behalf of the patient to facilitate additional support services in alignment with safe discharge planning   Reason for Continuation of Hospitalization: Anxiety Depression Hallucinations Suicidal ideation   Estimated Length of Stay: 1-7 days. 08/22/23 Update: TBD.   Last 3 [place] Suicide Severity Risk Score: Flowsheet Row Admission (Current) from 08/16/2023 in [facility] INPATIENT [facility]HAVIORAL  MEDICINE ED from 08/15/2023 in [facility] Emergency Department at [facility] ED from 07/28/2023 in [facility]  C-SSRS RISK CATEGORY Low Risk Low Risk Low Risk       Last [facility] 2/9 Scores:    05/23/2023   10:48 AM 03/07/2023    4:33 PM 01/22/2023    3:41 PM  Depression screen PHQ 2/9  Decreased Interest 0 2 2  Down, Depressed, Hopeless 0 2 2  PHQ - 2 Score 0 4 4  Altered sleeping 0 2 2  Tired, decreased energy 0 2 2  Change in appetite 0 2 2  Feeling bad or failure about yourself  0 2 2  Trouble concentrating 1 0 2  Moving slowly or fidgety/restless 0 0 0  Suicidal thoughts 0 0 0  PHQ-9 Score 1 12 14   Difficult doing work/chores Not difficult at all Very difficult Somewhat difficult    Scribe for Treatment Team: Roselle Conner, LCSW 08/22/2023 1:44 PM

## 2023-08-23 DIAGNOSIS — F209 Schizophrenia, unspecified: Secondary | ICD-10-CM | POA: Diagnosis not present

## 2023-08-23 NOTE — Progress Notes (Signed)
   08/23/23 0924  Psych Admission Type (Psych Patients Only)  Admission Status Voluntary  Psychosocial Assessment  Patient Complaints None  Eye Contact Fair  Facial Expression Flat  Affect Flat  Speech Logical/coherent;Soft  Interaction Isolative  Motor Activity Slow  Appearance/Hygiene Unremarkable  Behavior Characteristics Cooperative  Mood Depressed (Patient writes her goal for today is "going to group." She writes she will "get out of bed" to help her reach her goal.)  Thought Process  Coherency WDL  Content WDL  Delusions None reported or observed  Perception WDL  Hallucination None reported or observed  Judgment Poor  Confusion Mild  Danger to Self  Current suicidal ideation? Denies  Self-Injurious Behavior No self-injurious ideation or behavior indicators observed or expressed   Danger to Others  Danger to Others None reported or observed

## 2023-08-23 NOTE — Progress Notes (Signed)
 [facility] MD Progress Note  08/23/2023 10:25 AM Carly Brooks  MRN:  [id number]   Subjective:  Carly Brooks is a 28 y.o. female admitted: Presented to the ED on 08/15/2023  4:28 PM for suicidal Ideation without a plan. She carries the psychiatric diagnoses of schizophrenia, and depression Patient is admitted to adult psych unit with Q15 min safety monitoring. Multidisciplinary team approach is offered. Medication management; group/milieu therapy is offered. Patient continues to have delusions of grandeur believing she is a god and being able to read minds.   08/23/23 On follow-up today patient is found in room they are awake and sitting calmly.  They are alert and oriented x 3.  They are more linear today.  Continue to be optimistic about discharge.  They report they have not had hallucinations x 2 days.  Still notes some difficulty with impulse control they did not require PRNs for agitation yesterday.  These as needed trazodone  overnight.  They continue to report tolerating the medication well.  They voiced no concerns or complaints. 08/22/23 On follow-up patient was found in their room they were sleeping but wake easily. They are alert and oriented x 3. They note stable appetite and sleep. Have some continued anxiety. Denied SI hi AVH. Have some continued thoughts that the police are out to get her. Is optimistic about going home and see her toddlers. Prefer the twice a day dosing of zyprexa . Denies adverse effects of meds, feels they are near baseline.     Sleep: Fair  Appetite:  Fair  Past Psychiatric History: see h&P Family History:  Family History  Problem Relation Age of Onset   Diabetes Mother    Social History:  Social History   Substance and Sexual Activity  Alcohol Use Not Currently   Alcohol/week: 2.0 standard drinks of alcohol   Types: 2 Glasses of wine per week     Social History   Substance and Sexual Activity  Drug Use Never    Social History    Socioeconomic History   Marital status: Single    Spouse name: Not on file   Number of children: Not on file   Years of education: Not on file   Highest education level: Not on file  Occupational History   Not on file  Tobacco Use   Smoking status: Never   Smokeless tobacco: Never   Tobacco comments:    "I smoke weed everyday"  Vaping Use   Vaping status: Never Used  Substance and Sexual Activity   Alcohol use: Not Currently    Alcohol/week: 2.0 standard drinks of alcohol    Types: 2 Glasses of wine per week   Drug use: Never   Sexual activity: Yes    Partners: Male  Other Topics Concern   Not on file  Social History Narrative   Not on file   Social Drivers of Health   Financial Resource Strain: High Risk (05/23/2023)   Overall Financial Resource Strain (CARDIA)    Difficulty of Paying Living Expenses: Very hard  Food Insecurity: Patient Declined (08/17/2023)   Hunger Vital Sign    Worried About Running Out of Food in the Last Year: Patient declined    Ran Out of Food in the Last Year: Patient declined  Recent Concern: Food Insecurity - Food Insecurity Present (07/12/2023)   Hunger Vital Sign    Worried About Running Out of Food in the Last Year: Sometimes true    Ran Out of Food in the Last Year:  Sometimes true  Transportation Needs: Patient Declined (08/17/2023)   PRAPARE - Administrator, Civil Service (Medical): Patient declined    Lack of Transportation (Non-Medical): Patient declined  Recent Concern: Transportation Needs - Unmet Transportation Needs (07/12/2023)   PRAPARE - Administrator, Civil Service (Medical): Yes    Lack of Transportation (Non-Medical): Yes  Physical Activity: Inactive (05/23/2023)   Exercise Vital Sign    Days of Exercise per Week: 0 days    Minutes of Exercise per Session: 0 min  Stress: No Stress Concern Present (05/23/2023)   [employer] of Occupational Health - Occupational Stress Questionnaire    Feeling of  Stress : Not at all  Social Connections: Patient Declined (08/17/2023)   Social Connection and Isolation Panel [NHANES]    Frequency of Communication with Friends and Family: Patient declined    Frequency of Social Gatherings with Friends and Family: Patient declined    Attends Religious Services: Patient declined    Database administrator or Organizations: Patient declined    Attends Banker Meetings: Patient declined    Marital Status: Patient declined  Recent Concern: Social Connections - Moderately Isolated (05/23/2023)   Social Connection and Isolation Panel [NHANES]    Frequency of Communication with Friends and Family: Never    Frequency of Social Gatherings with Friends and Family: Never    Attends Religious Services: More than 4 times per year    Active Member of Clubs or Organizations: Yes    Attends Engineer, structural: More than 4 times per year    Marital Status: Never married   Past Medical History:  Past Medical History:  Diagnosis Date   Anxiety    Cluster B personality disorder (HCC)    Depression    Medical history non-contributory     Past Surgical History:  Procedure Laterality Date   CESAREAN SECTION     CESAREAN SECTION  04/18/2021   CESAREAN SECTION WITH BILATERAL TUBAL LIGATION N/A 04/18/2021   Procedure: CESAREAN SECTION    Current Medications: Current Facility-Administered Medications  Medication Dose Route Frequency Provider Last Rate Last Admin   acetaminophen  (TYLENOL ) tablet 650 mg  650 mg Oral Q6H PRN Motley-Mangrum, Jadeka A, PMHNP       alum & mag hydroxide-simeth (MAALOX/MYLANTA) 200-200-20 MG/5ML suspension 30 mL  30 mL Oral Q4H PRN Motley-Mangrum, Jadeka A, PMHNP       haloperidol  (HALDOL ) tablet 5 mg  5 mg Oral TID PRN Motley-Mangrum, Jadeka A, PMHNP   5 mg at 08/21/23 2041   And   diphenhydrAMINE  (BENADRYL ) capsule 50 mg  50 mg Oral TID PRN Motley-Mangrum, Jadeka A, PMHNP   50 mg at 08/21/23 2040   haloperidol  lactate  (HALDOL ) injection 5 mg  5 mg Intramuscular TID PRN Motley-Mangrum, Jadeka A, PMHNP       And   diphenhydrAMINE  (BENADRYL ) injection 50 mg  50 mg Intramuscular TID PRN Motley-Mangrum, Jadeka A, PMHNP       And   LORazepam  (ATIVAN ) injection 2 mg  2 mg Intramuscular TID PRN Motley-Mangrum, Jadeka A, PMHNP       haloperidol  lactate (HALDOL ) injection 10 mg  10 mg Intramuscular TID PRN Motley-Mangrum, Jadeka A, PMHNP       And   diphenhydrAMINE  (BENADRYL ) injection 50 mg  50 mg Intramuscular TID PRN Motley-Mangrum, Jadeka A, PMHNP       And   LORazepam  (ATIVAN ) injection 2 mg  2 mg Intramuscular TID PRN Motley-Mangrum,  Jadeka A, PMHNP       FLUoxetine  (PROZAC ) capsule 20 mg  20 mg Oral Daily Motley-Mangrum, Jadeka A, PMHNP   20 mg at 08/23/23 0835   hydrOXYzine  (ATARAX ) tablet 25 mg  25 mg Oral Q8H PRN Motley-Mangrum, Jadeka A, PMHNP   25 mg at 08/21/23 2040   magnesium  hydroxide (MILK OF MAGNESIA) suspension 30 mL  30 mL Oral Daily PRN Motley-Mangrum, Jadeka A, PMHNP       nicotine  (NICODERM CQ  - dosed in mg/24 hours) patch 21 mg  21 mg Transdermal Daily Motley-Mangrum, Jadeka A, PMHNP   21 mg at 08/23/23 0835   OLANZapine  (ZYPREXA ) tablet 10 mg  10 mg Oral BID Shrivastava, Aryendra, MD   10 mg at 08/23/23 0834   traZODone  (DESYREL ) tablet 50 mg  50 mg Oral QHS PRN Motley-Mangrum, Jadeka A, PMHNP   50 mg at 08/22/23 2114    Lab Results: No results found for this or any previous visit (from the past 48 hours).  Blood Alcohol level:  Lab Results  Component Value Date   [facility] <15 08/15/2023   ETH <15 07/28/2023    Metabolic Disorder Labs: Lab Results  Component Value Date   HGBA1C 5.0 07/28/2023   MPG 96.8 07/28/2023   MPG 93.93 06/09/2023   Lab Results  Component Value Date   PROLACTIN 14.2 07/12/2023   Lab Results  Component Value Date   CHOL 146 07/28/2023   TRIG 48 07/28/2023   HDL 41 07/28/2023   CHOLHDL 3.6 07/28/2023   VLDL 10 07/28/2023   LDLCALC 95 07/28/2023   LDLCALC  101 (H) 06/09/2023    Physical Findings: AIMS:  , ,  ,  ,    CIWA:    COWS:      Psychiatric Specialty Exam:  Presentation  General Appearance:  Casual  Eye Contact: Good  Speech: Clear and Coherent  Speech Volume: Normal    Mood and Affect  Mood: Euthymic  Affect: Appropriate   Thought Process  Thought Processes: Linear  Descriptions of Associations:Intact  Orientation:Full (Time, Place and Person)  Thought Content:Logical  Hallucinations:Hallucinations: None  Ideas of Reference:None  Suicidal Thoughts:Suicidal Thoughts: No  Homicidal Thoughts:Homicidal Thoughts: No   Sensorium  Memory: Immediate Fair  Judgment: -- (improving)  Insight: Shallow   Executive Functions  Concentration: Fair  Attention Span: Fair  Recall: [employer] of Knowledge: Fair  Language: Fair   Psychomotor Activity  Psychomotor Activity: Psychomotor Activity: Normal  Musculoskeletal: Strength & Muscle Tone: within normal limits Gait & Station: normal Assets  Assets: Manufacturing systems engineer; Desire for Improvement; Social Support; Housing    Physical Exam: Physical Exam Vitals and nursing note reviewed.  HENT:     Head: Atraumatic.  Eyes:     Extraocular Movements: Extraocular movements intact.  Pulmonary:     Effort: Pulmonary effort is normal.  Neurological:     Mental Status: She is alert and oriented to person, place, and time.    Review of Systems  Psychiatric/Behavioral:  Negative for depression, hallucinations, substance abuse and suicidal ideas. The patient is nervous/anxious. The patient does not have insomnia.    Blood pressure 111/77, pulse 81, temperature 97.7 F (36.5 C), resp. rate 18, height 5\' 8"  (1.727 m), weight 116.6 kg, SpO2 100%, unknown if currently breastfeeding. Body mass index is 39.08 kg/m.  Diagnosis: Principal Problem:   Schizophrenia (HCC)   PLAN: Safety and Monitoring:  -- Voluntary admission to  inpatient psychiatric unit for safety, stabilization and treatment  --  Daily contact with patient to assess and evaluate symptoms and progress in treatment  -- Patient's case to be discussed in multi-disciplinary team meeting  -- Observation Level : q15 minute checks  -- Vital signs:  q12 hours  -- Precautions: suicide, elopement, and assault -- Encouraged patient to participate in unit milieu and in scheduled group therapies  2. Psychiatric Diagnoses and Treatment:  Since transition to twice daily dosing of Zyprexa  patient has done well.  Will look to discharge in coming days to set follow-up patient is near baseline notably they received Invega  Sustenna Aug 07, 2023.  And will need to follow-up with her outpatient provider for ongoing management.  Continue zyprexa  10 mg bid   Continue Prozac  20 mg daily    - received prn for agitation over night they are nearing baseline but require continued monitoring.  -- The risks/benefits/side-effects/alternatives to this medication were discussed in detail with the patient and time was given for questions. The patient consents to medication trial.                -- Metabolic profile and EKG monitoring obtained while on an atypical antipsychotic (BMI: Lipid Panel: HbgA1c: QTc:)              -- Encouraged patient to participate in unit milieu and in scheduled group therapies      3. Medical Issues Being Addressed:     No urgent medical needs identified at this time  4. Discharge Planning:   -- follow up with family plan is for pt to discharge home per review of collateral pt appears near baseline             -- Social work and case management to assist with discharge planning and identification of hospital follow-up needs prior to discharge             -- Estimated LOS: 5-7 days             -- Discharge Concerns: Need to establish a safety plan; Medication compliance and effectiveness             -- Discharge Goals: Return home with outpatient  referrals follow ups  Fay Hoop, PA-C 08/23/2023, 10:25 AM

## 2023-08-23 NOTE — Group Note (Signed)
 Recreation Therapy Group Note   Group Topic:Coping Skills  Group Date: 08/23/2023 Start Time: 1530 End Time: 1620 Facilitators: Yvonna Herder, CTRS Location: Craft Room  Group Description: Coping A-Z. LRT and patients engage in a guided discussion on what coping skills are and gave specific examples. LRT passed out a handout labeled Coping A-Z with blank spaces beside each letter. LRT prompted patients to come up with a coping skill for each of the letters. LRT and patients went over the handout and gave ideas for each letter if anyone had any blanks left on their paper. Patients kept this handout with them that listed 26 different coping skills.   Goal Area(s) Addressed: Patients will be able to define "coping skills". Patient will identify new coping skills.  Patient will increase communication.   Affect/Mood: N/A   Participation Level: Moderate   Behavior: Bizarre    Clinical Observations/Individualized Feedback: Macee came late to group. Pt came in and sat criss crossed right beside LRT on the floor, although there were 2 open chairs. Pt sat in a chair after encouragement from LRT. Pt completed her handout and all responses were appropriate and logical.   Plan: Continue to engage patient in RT group sessions 2-3x/week.   Deatrice Factor, LRT, CTRS 08/23/2023 5:29 PM

## 2023-08-23 NOTE — Plan of Care (Signed)
  Problem: Education: Goal: Emotional status will improve Outcome: Progressing Goal: Mental status will improve Outcome: Progressing   Problem: Activity: Goal: Interest or engagement in activities will improve Outcome: Not Progressing   Problem: Health Behavior/Discharge Planning: Goal: Compliance with treatment plan for underlying cause of condition will improve Outcome: Progressing   Problem: Physical Regulation: Goal: Ability to maintain clinical measurements within normal limits will improve Outcome: Progressing   Problem: Safety: Goal: Periods of time without injury will increase Outcome: Progressing

## 2023-08-23 NOTE — Plan of Care (Signed)
   Problem: Education: Goal: Knowledge of [place] [facility] Education information/materials will improve Outcome: Progressing Goal: Emotional status will improve Outcome: Progressing Goal: Mental status will improve Outcome: Progressing Goal: Verbalization of understanding the information provided will improve Outcome: Progressing   Problem: Activity: Goal: Interest or engagement in activities will improve Outcome: Progressing Goal: Sleeping patterns will improve Outcome: Progressing   Problem: Coping: Goal: Ability to verbalize frustrations and anger appropriately will improve Outcome: Progressing Goal: Ability to demonstrate self-control will improve Outcome: Progressing   Problem: Health Behavior/Discharge Planning: Goal: Identification of resources available to assist in meeting health care needs will improve Outcome: Progressing Goal: Compliance with treatment plan for underlying cause of condition will improve Outcome: Progressing   Problem: Physical Regulation: Goal: Ability to maintain clinical measurements within normal limits will improve Outcome: Progressing   Problem: Safety: Goal: Periods of time without injury will increase Outcome: Progressing   Problem: Activity: Goal: Will verbalize the importance of balancing activity with adequate rest periods Outcome: Progressing   Problem: Education: Goal: Will be free of psychotic symptoms Outcome: Progressing Goal: Knowledge of the prescribed therapeutic regimen will improve Outcome: Progressing   Problem: Coping: Goal: Coping ability will improve Outcome: Progressing Goal: Will verbalize feelings Outcome: Progressing   Problem: Health Behavior/Discharge Planning: Goal: Compliance with prescribed medication regimen will improve Outcome: Progressing   Problem: Nutritional: Goal: Ability to achieve adequate nutritional intake will improve Outcome: Progressing   Problem: Role Relationship: Goal:  Ability to communicate needs accurately will improve Outcome: Progressing Goal: Ability to interact with others will improve Outcome: Progressing   Problem: Safety: Goal: Ability to redirect hostility and anger into socially appropriate behaviors will improve Outcome: Progressing Goal: Ability to remain free from injury will improve Outcome: Progressing   Problem: Self-Care: Goal: Ability to participate in self-care as condition permits will improve Outcome: Progressing   Problem: Self-Concept: Goal: Will verbalize positive feelings about self Outcome: Progressing

## 2023-08-23 NOTE — Group Note (Signed)
 Date:  08/23/2023 Time:  8:40 PM  Group Topic/Focus:  Orientation:   The focus of this group is to educate the patient on the purpose and policies of crisis stabilization and provide a format to answer questions about their admission.  The group details unit policies and expectations of patients while admitted.    Participation Level:  Active  Participation Quality:  Appropriate and Attentive  Affect:  Appropriate  Cognitive:  Alert and Appropriate  Insight: Appropriate and Good  Engagement in Group:  Engaged and Improving  Modes of Intervention:  Clarification, Education, Orientation, Rapport Building, and Support  Additional Comments:     Eldar Robitaille 08/23/2023, 8:40 PM

## 2023-08-23 NOTE — Group Note (Signed)
 Date:  08/23/2023 Time:  10:17 AM  Group Topic/Focus:   Goals Group:   The focus of this group is to help patients establish daily goals to achieve during treatment and discuss how the patient can incorporate goal setting into their daily lives to aide in recovery.   Participation Level:  Did Not Attend   Harveer Sadler A Indria Bishara 08/23/2023, 10:17 AM

## 2023-08-23 NOTE — Group Note (Signed)
 Recreation Therapy Group Note   Group Topic:Relaxation  Group Date: 08/23/2023 Start Time: 1000 End Time: 1050 Facilitators: Deatrice Factor, LRT, CTRS Location: Craft Room  Group Description: PMR (Progressive Muscle Relaxation). LRT asks patients their current level of stress/anxiety from 1-10, with 10 being the highest. LRT educates patients on what PMR is and the benefits that come from it. Patients are asked to sit with their feet flat on the floor while sitting up and all the way back in their chair, if possible. LRT and pts follow a prompt through a speaker that requires you to tense and release different muscles in their body and focus on their breathing. During session, lights are off and soft music is being played. Pts are given a stress ball to use if needed. At the end of the prompt, LRT asks patients to rank their current levels of stress/anxiety from 1-10, 10 being the highest. LRT provides patients with an education handout on PMR.   Goal Area(s) Addressed:  Patients will be able to describe progressive muscle relaxation.  Patient will practice using relaxation technique. Patient will identify a new coping skill.  Patient will follow multistep directions to reduce anxiety and stress.   Affect/Mood: N/A   Participation Level: Did not attend    Clinical Observations/Individualized Feedback: Patient did not attend group.   Plan: Continue to engage patient in RT group sessions 2-3x/week.   Deatrice Factor, LRT, CTRS 08/23/2023 11:47 AM

## 2023-08-23 NOTE — Group Note (Signed)
 [facility] LCSW Group Therapy Note   Group Date: 08/23/2023 Start Time: 1300 End Time: 1335   Type of Therapy/Topic:  Group Therapy:  Balance in Life  Participation Level:  Did Not Attend   Description of Group:    This group will address the concept of balance and how it feels and looks when one is unbalanced. Patients will be encouraged to process areas in their lives that are out of balance, and identify reasons for remaining unbalanced. Facilitators will guide patients utilizing problem- solving interventions to address and correct the stressor making their life unbalanced. Understanding and applying boundaries will be explored and addressed for obtaining  and maintaining a balanced life. Patients will be encouraged to explore ways to assertively make their unbalanced needs known to significant others in their lives, using other group members and facilitator for support and feedback.  Therapeutic Goals: Patient will identify two or more emotions or situations they have that consume much of in their lives. Patient will identify signs/triggers that life has become out of balance:  Patient will identify two ways to set boundaries in order to achieve balance in their lives:  Patient will demonstrate ability to communicate their needs through discussion and/or role plays  Summary of Patient Progress: Patient did not attend group.    Therapeutic Modalities:   Cognitive Behavioral Therapy Solution-Focused Therapy Assertiveness Training   Randolm Butte, LCSW

## 2023-08-23 NOTE — Progress Notes (Signed)
   08/23/23 2100  Psych Admission Type (Psych Patients Only)  Admission Status Voluntary  Psychosocial Assessment  Patient Complaints None  Eye Contact Fair  Facial Expression Flat  Affect Flat  Speech Logical/coherent  Interaction Minimal  Motor Activity Slow  Appearance/Hygiene Unremarkable  Behavior Characteristics Cooperative  Mood Pleasant  Aggressive Behavior  Effect No apparent injury  Thought Process  Coherency WDL  Content WDL  Delusions None reported or observed  Perception WDL  Hallucination None reported or observed  Judgment Poor  Confusion None  Danger to Self  Current suicidal ideation? Denies  Self-Injurious Behavior No self-injurious ideation or behavior indicators observed or expressed   Agreement Not to Harm Self Yes  Danger to Others  Danger to Others None reported or observed

## 2023-08-24 DIAGNOSIS — F209 Schizophrenia, unspecified: Secondary | ICD-10-CM | POA: Diagnosis not present

## 2023-08-24 MED ORDER — FLUOXETINE HCL 20 MG PO CAPS
40.0000 mg | ORAL_CAPSULE | Freq: Every day | ORAL | Status: DC
  Administered 2023-08-25 – 2023-08-26 (×2): 40 mg via ORAL
  Filled 2023-08-24 (×2): qty 2

## 2023-08-24 NOTE — Progress Notes (Signed)
   08/24/23 2100  Psych Admission Type (Psych Patients Only)  Admission Status Voluntary  Psychosocial Assessment  Patient Complaints None  Eye Contact Brief  Facial Expression Animated (smiles occassionally)  Affect Appropriate to circumstance  Speech Logical/coherent  Interaction Minimal  Motor Activity Slow  Appearance/Hygiene In scrubs;Unremarkable  Behavior Characteristics Cooperative;Calm  Mood Pleasant  Thought Process  Coherency WDL  Content WDL  Delusions None reported or observed  Perception WDL  Hallucination None reported or observed  Judgment WDL  Confusion WDL  Danger to Self  Current suicidal ideation? Denies  Self-Injurious Behavior No self-injurious ideation or behavior indicators observed or expressed   Agreement Not to Harm Self Yes  Description of Agreement verbal

## 2023-08-24 NOTE — Plan of Care (Signed)
   Problem: Education: Goal: Knowledge of [place] [facility] Education information/materials will improve Outcome: Progressing Goal: Emotional status will improve Outcome: Progressing Goal: Mental status will improve Outcome: Progressing Goal: Verbalization of understanding the information provided will improve Outcome: Progressing   Problem: Activity: Goal: Interest or engagement in activities will improve Outcome: Progressing Goal: Sleeping patterns will improve Outcome: Progressing   Problem: Coping: Goal: Ability to verbalize frustrations and anger appropriately will improve Outcome: Progressing Goal: Ability to demonstrate self-control will improve Outcome: Progressing   Problem: Health Behavior/Discharge Planning: Goal: Identification of resources available to assist in meeting health care needs will improve Outcome: Progressing Goal: Compliance with treatment plan for underlying cause of condition will improve Outcome: Progressing   Problem: Physical Regulation: Goal: Ability to maintain clinical measurements within normal limits will improve Outcome: Progressing   Problem: Safety: Goal: Periods of time without injury will increase Outcome: Progressing   Problem: Activity: Goal: Will verbalize the importance of balancing activity with adequate rest periods Outcome: Progressing   Problem: Education: Goal: Will be free of psychotic symptoms Outcome: Progressing Goal: Knowledge of the prescribed therapeutic regimen will improve Outcome: Progressing   Problem: Coping: Goal: Coping ability will improve Outcome: Progressing Goal: Will verbalize feelings Outcome: Progressing   Problem: Health Behavior/Discharge Planning: Goal: Compliance with prescribed medication regimen will improve Outcome: Progressing   Problem: Nutritional: Goal: Ability to achieve adequate nutritional intake will improve Outcome: Progressing   Problem: Role Relationship: Goal:  Ability to communicate needs accurately will improve Outcome: Progressing Goal: Ability to interact with others will improve Outcome: Progressing   Problem: Safety: Goal: Ability to redirect hostility and anger into socially appropriate behaviors will improve Outcome: Progressing Goal: Ability to remain free from injury will improve Outcome: Progressing   Problem: Self-Care: Goal: Ability to participate in self-care as condition permits will improve Outcome: Progressing   Problem: Self-Concept: Goal: Will verbalize positive feelings about self Outcome: Progressing

## 2023-08-24 NOTE — Progress Notes (Signed)
 [facility] MD Progress Note  08/24/2023 12:10 PM Carly Brooks  MRN:  [id number]   Subjective:  Carly Brooks is a 28 y.o. female admitted: Presented to the ED on 08/15/2023  4:28 PM for suicidal Ideation without a plan. She carries the psychiatric diagnoses of schizophrenia, and depression Patient is admitted to adult psych unit with Q15 min safety monitoring. Multidisciplinary team approach is offered. Medication management; group/milieu therapy is offered. Patient continues to have delusions of grandeur believing she is a god and being able to read minds.   08/24/23: Chart reviewed patient discussed in multidisciplinary rounds.  Patient was seen for follow-up today with initial plan to discharge however patient indicated they do not feel they are ready they noted ongoing paranoia that the police were out to get them.  They denied SI HI, and AVH.  They noticed increased anxiety and are agreeable to medication adjustment.  Will titrate Prozac .  Will continue other meds as scheduled.  Denies adverse effects of the medication.  They are linear on exam not appear internally.  Patient has been medication compliant.  They received as needed trazodone  overnight.  08/23/23 On follow-up today patient is found in room they are awake and sitting calmly.  They are alert and oriented x 3.  They are more linear today.  Continue to be optimistic about discharge.  They report they have not had hallucinations x 2 days.  Still notes some difficulty with impulse control they did not require PRNs for agitation yesterday.  These as needed trazodone  overnight.  They continue to report tolerating the medication well.  They voiced no concerns or complaints. 08/22/23 On follow-up patient was found in their room they were sleeping but wake easily. They are alert and oriented x 3. They note stable appetite and sleep. Have some continued anxiety. Denied SI hi AVH. Have some continued thoughts that the police are out to get  her. Is optimistic about going home and see her toddlers. Prefer the twice a day dosing of zyprexa . Denies adverse effects of meds, feels they are near baseline.     Sleep: Fair  Appetite:  Fair  Past Psychiatric History: see h&P Family History:  Family History  Problem Relation Age of Onset   Diabetes Mother    Social History:  Social History   Substance and Sexual Activity  Alcohol Use Not Currently   Alcohol/week: 2.0 standard drinks of alcohol   Types: 2 Glasses of wine per week     Social History   Substance and Sexual Activity  Drug Use Never    Social History   Socioeconomic History   Marital status: Single    Spouse name: Not on file   Number of children: Not on file   Years of education: Not on file   Highest education level: Not on file  Occupational History   Not on file  Tobacco Use   Smoking status: Never   Smokeless tobacco: Never   Tobacco comments:    "I smoke weed everyday"  Vaping Use   Vaping status: Never Used  Substance and Sexual Activity   Alcohol use: Not Currently    Alcohol/week: 2.0 standard drinks of alcohol    Types: 2 Glasses of wine per week   Drug use: Never   Sexual activity: Yes    Partners: Male  Other Topics Concern   Not on file  Social History Narrative   Not on file   Social Drivers of Corporate investment banker  Strain: High Risk (05/23/2023)   Overall Financial Resource Strain (CARDIA)    Difficulty of Paying Living Expenses: Very hard  Food Insecurity: Patient Declined (08/17/2023)   Hunger Vital Sign    Worried About Running Out of Food in the Last Year: Patient declined    Ran Out of Food in the Last Year: Patient declined  Recent Concern: Food Insecurity - Food Insecurity Present (07/12/2023)   Hunger Vital Sign    Worried About Programme researcher, broadcasting/film/video in the Last Year: Sometimes true    Ran Out of Food in the Last Year: Sometimes true  Transportation Needs: Patient Declined (08/17/2023)   PRAPARE -  Administrator, Civil Service (Medical): Patient declined    Lack of Transportation (Non-Medical): Patient declined  Recent Concern: Transportation Needs - Unmet Transportation Needs (07/12/2023)   PRAPARE - Administrator, Civil Service (Medical): Yes    Lack of Transportation (Non-Medical): Yes  Physical Activity: Inactive (05/23/2023)   Exercise Vital Sign    Days of Exercise per Week: 0 days    Minutes of Exercise per Session: 0 min  Stress: No Stress Concern Present (05/23/2023)   [employer] of Occupational Health - Occupational Stress Questionnaire    Feeling of Stress : Not at all  Social Connections: Patient Declined (08/17/2023)   Social Connection and Isolation Panel [NHANES]    Frequency of Communication with Friends and Family: Patient declined    Frequency of Social Gatherings with Friends and Family: Patient declined    Attends Religious Services: Patient declined    Database administrator or Organizations: Patient declined    Attends Banker Meetings: Patient declined    Marital Status: Patient declined  Recent Concern: Social Connections - Moderately Isolated (05/23/2023)   Social Connection and Isolation Panel [NHANES]    Frequency of Communication with Friends and Family: Never    Frequency of Social Gatherings with Friends and Family: Never    Attends Religious Services: More than 4 times per year    Active Member of Clubs or Organizations: Yes    Attends Engineer, structural: More than 4 times per year    Marital Status: Never married   Past Medical History:  Past Medical History:  Diagnosis Date   Anxiety    Cluster B personality disorder (HCC)    Depression    Medical history non-contributory     Past Surgical History:  Procedure Laterality Date   CESAREAN SECTION     CESAREAN SECTION  04/18/2021   CESAREAN SECTION WITH BILATERAL TUBAL LIGATION N/A 04/18/2021   Procedure: CESAREAN SECTION    Current  Medications: Current Facility-Administered Medications  Medication Dose Route Frequency Provider Last Rate Last Admin   acetaminophen  (TYLENOL ) tablet 650 mg  650 mg Oral Q6H PRN Motley-Mangrum, Jadeka A, PMHNP       alum & mag hydroxide-simeth (MAALOX/MYLANTA) 200-200-20 MG/5ML suspension 30 mL  30 mL Oral Q4H PRN Motley-Mangrum, Jadeka A, PMHNP       haloperidol  (HALDOL ) tablet 5 mg  5 mg Oral TID PRN Motley-Mangrum, Jadeka A, PMHNP   5 mg at 08/21/23 2041   And   diphenhydrAMINE  (BENADRYL ) capsule 50 mg  50 mg Oral TID PRN Motley-Mangrum, Jadeka A, PMHNP   50 mg at 08/21/23 2040   haloperidol  lactate (HALDOL ) injection 5 mg  5 mg Intramuscular TID PRN Motley-Mangrum, Jadeka A, PMHNP       And   diphenhydrAMINE  (BENADRYL ) injection 50 mg  50 mg Intramuscular TID PRN Motley-Mangrum, Jadeka A, PMHNP       And   LORazepam  (ATIVAN ) injection 2 mg  2 mg Intramuscular TID PRN Motley-Mangrum, Jadeka A, PMHNP       haloperidol  lactate (HALDOL ) injection 10 mg  10 mg Intramuscular TID PRN Motley-Mangrum, Jadeka A, PMHNP       And   diphenhydrAMINE  (BENADRYL ) injection 50 mg  50 mg Intramuscular TID PRN Motley-Mangrum, Jadeka A, PMHNP       And   LORazepam  (ATIVAN ) injection 2 mg  2 mg Intramuscular TID PRN Motley-Mangrum, Jadeka A, PMHNP       [START ON 08/25/2023] FLUoxetine  (PROZAC ) capsule 40 mg  40 mg Oral Daily Virtie Bungert E, PA-C       hydrOXYzine  (ATARAX ) tablet 25 mg  25 mg Oral Q8H PRN Motley-Mangrum, Jadeka A, PMHNP   25 mg at 08/21/23 2040   magnesium  hydroxide (MILK OF MAGNESIA) suspension 30 mL  30 mL Oral Daily PRN Motley-Mangrum, Jadeka A, PMHNP       nicotine  (NICODERM CQ  - dosed in mg/24 hours) patch 21 mg  21 mg Transdermal Daily Motley-Mangrum, Jadeka A, PMHNP   21 mg at 08/23/23 0835   OLANZapine  (ZYPREXA ) tablet 10 mg  10 mg Oral BID Shrivastava, Aryendra, MD   10 mg at 08/24/23 4098   traZODone  (DESYREL ) tablet 50 mg  50 mg Oral QHS PRN Motley-Mangrum, Jadeka A, PMHNP   50  mg at 08/23/23 2102    Lab Results: No results found for this or any previous visit (from the past 48 hours).  Blood Alcohol level:  Lab Results  Component Value Date   [facility] <15 08/15/2023   ETH <15 07/28/2023    Metabolic Disorder Labs: Lab Results  Component Value Date   HGBA1C 5.0 07/28/2023   MPG 96.8 07/28/2023   MPG 93.93 06/09/2023   Lab Results  Component Value Date   PROLACTIN 14.2 07/12/2023   Lab Results  Component Value Date   CHOL 146 07/28/2023   TRIG 48 07/28/2023   HDL 41 07/28/2023   CHOLHDL 3.6 07/28/2023   VLDL 10 07/28/2023   LDLCALC 95 07/28/2023   LDLCALC 101 (H) 06/09/2023    Physical Findings: AIMS:  , ,  ,  ,    CIWA:    COWS:      Psychiatric Specialty Exam:  Presentation  General Appearance:  Casual  Eye Contact: Fair  Speech: Clear and Coherent  Speech Volume: Normal    Mood and Affect  Mood: Anxious  Affect: Congruent   Thought Process  Thought Processes: Linear  Descriptions of Associations:Intact  Orientation:Full (Time, Place and Person)  Thought Content:Logical  Hallucinations:Hallucinations: None  Ideas of Reference:None  Suicidal Thoughts:Suicidal Thoughts: No  Homicidal Thoughts:Homicidal Thoughts: No   Sensorium  Memory: Immediate Fair  Judgment: Poor  Insight: Shallow   Executive Functions  Concentration: Fair  Attention Span: Fair  Recall: [employer] of Knowledge: Fair  Language: Fair   Psychomotor Activity  Psychomotor Activity: Psychomotor Activity: Restlessness  Musculoskeletal: Strength & Muscle Tone: within normal limits Gait & Station: normal Assets  Assets: Manufacturing systems engineer; Social Support    Physical Exam: Physical Exam Vitals and nursing note reviewed.  HENT:     Head: Atraumatic.  Eyes:     Extraocular Movements: Extraocular movements intact.  Pulmonary:     Effort: Pulmonary effort is normal.  Neurological:     Mental Status: She  is alert and oriented to person,  place, and time.  Psychiatric:        Mood and Affect: Mood is anxious.        Thought Content: Thought content is paranoid. Thought content does not include homicidal or suicidal ideation.    Review of Systems  Psychiatric/Behavioral:  Negative for depression, hallucinations, substance abuse and suicidal ideas. The patient is nervous/anxious. The patient does not have insomnia.    Blood pressure 111/78, pulse 78, temperature 98.6 F (37 C), resp. rate 18, height 5\' 8"  (1.727 m), weight 116.6 kg, SpO2 100%, unknown if currently breastfeeding. Body mass index is 39.08 kg/m.  Diagnosis: Principal Problem:   Schizophrenia (HCC)   PLAN: Safety and Monitoring:  -- Voluntary admission to inpatient psychiatric unit for safety, stabilization and treatment  -- Daily contact with patient to assess and evaluate symptoms and progress in treatment  -- Patient's case to be discussed in multi-disciplinary team meeting  -- Observation Level : q15 minute checks  -- Vital signs:  q12 hours  -- Precautions: suicide, elopement, and assault -- Encouraged patient to participate in unit milieu and in scheduled group therapies  2. Psychiatric Diagnoses and Treatment:  Since transition to twice daily dosing of Zyprexa  patient has done well.  Will look to discharge in coming days to set follow-up patient is near baseline notably they received Invega  Sustenna Aug 07, 2023.  And will need to follow-up with her outpatient provider for ongoing management.  Continue zyprexa  10 mg bid  Increase Prozac  to 40 mg daily   -- The risks/benefits/side-effects/alternatives to this medication were discussed in detail with the patient and time was given for questions. The patient consents to medication trial.                -- Metabolic profile and EKG monitoring obtained while on an atypical antipsychotic (BMI: Lipid Panel: HbgA1c: QTc:)              -- Encouraged patient to participate in  unit milieu and in scheduled group therapies      3. Medical Issues Being Addressed:     No urgent medical needs identified at this time  4. Discharge Planning:   -- follow up with family plan is for pt to discharge home per review of collateral pt appears near baseline             -- Social work and case management to assist with discharge planning and identification of hospital follow-up needs prior to discharge             -- Estimated LOS: 5-7 days             -- Discharge Concerns: Need to establish a safety plan; Medication compliance and effectiveness             -- Discharge Goals: Return home with outpatient referrals follow ups  Fay Hoop, PA-C 08/24/2023, 12:10 PM

## 2023-08-24 NOTE — Progress Notes (Signed)
 Noted patient walking in the hallway naked. Redirected back to her room and patient put cloths on. Patient stated that she had " a panic attack " when she is thinking about going home. Vistaril  x 1 given. Support and encouragement given.

## 2023-08-24 NOTE — Group Note (Signed)
 Date:  08/24/2023 Time:  10:13 AM  Group Topic/Focus:  Goals Group:   The focus of this group is to help patients establish daily goals to achieve during treatment and discuss how the patient can incorporate goal setting into their daily lives to aide in recovery.   Participation Level:  Did Not Attend   Carly Brooks [name] 08/24/2023, 10:13 AM

## 2023-08-24 NOTE — Plan of Care (Signed)
  Problem: Education: Goal: Knowledge of [facility] Education information/materials will improve Outcome: Progressing Goal: Mental status will improve Outcome: Progressing Goal: Verbalization of understanding the information provided will improve Outcome: Progressing   Problem: Coping: Goal: Ability to verbalize frustrations and anger appropriately will improve Outcome: Progressing   Problem: Health Behavior/Discharge Planning: Goal: Identification of resources available to assist in meeting health care needs will improve Outcome: Progressing   Problem: Safety: Goal: Periods of time without injury will increase Outcome: Progressing

## 2023-08-24 NOTE — Group Note (Signed)
 Recreation Therapy Group Note   Group Topic:Leisure Education  Group Date: 08/24/2023 Start Time: 1030 End Time: 1145 Facilitators: Deatrice Factor, LRT, CTRS Location: Craft Room  Group Description: Leisure. Patients were given the option to choose from singing karaoke, coloring mandalas, using oil pastels, journaling, or playing with play-doh. LRT and pts discussed the meaning of leisure, the importance of participating in leisure during their free time/when they're outside of the hospital, as well as how our leisure interests can also serve as coping skills.   Goal Area(s) Addressed:  Patient will identify a current leisure interest.  Patient will learn the definition of "leisure". Patient will practice making a positive decision. Patient will have the opportunity to try a new leisure activity. Patient will communicate with peers and LRT.    Affect/Mood: Appropriate   Participation Level: Minimal    Clinical Observations/Individualized Feedback: Carly Brooks was originally present in group. Pt played with playdoh for 10 minutes and then left group. Pt did not return.   Plan: Continue to engage patient in RT group sessions 2-3x/week.   Deatrice Factor, LRT, CTRS 08/24/2023 1:41 PM

## 2023-08-24 NOTE — Group Note (Signed)
 Date:  08/24/2023 Time:  8:57 PM  Group Topic/Focus:  Goals Group:   The focus of this group is to help patients establish daily goals to achieve during treatment and discuss how the patient can incorporate goal setting into their daily lives to aide in recovery. Recovery Goals:   The focus of this group is to identify appropriate goals for recovery and establish a plan to achieve them. Wellness Toolbox:   The focus of this group is to discuss various aspects of wellness, balancing those aspects and exploring ways to increase the ability to experience wellness.  Patients will create a wellness toolbox for use upon discharge.    Participation Level:  Active  Participation Quality:  Appropriate  Affect:  Appropriate  Cognitive:  Appropriate and Oriented  Insight: Appropriate  Engagement in Group:  Limited  Modes of Intervention:  Activity, Discussion, and Support  Additional Comments:  N/A  Carly Brooks L 08/24/2023, 8:57 PM

## 2023-08-24 NOTE — Group Note (Signed)
 Recreation Therapy Group Note   Group Topic:General Recreation  Group Date: 08/24/2023 Start Time: 1515 End Time: 1620 Facilitators: Deatrice Factor, LRT, CTRS Location: Courtyard  Group Description: [employer]. LRT and patients played games of basketball, drew with chalk, and played corn hole while outside in the courtyard while getting fresh air and sunlight. Music was being played in the background. LRT and peers conversed about different games they have played before, what they do in their free time and anything else that is on their minds. LRT encouraged pts to drink water  after being outside, sweating and getting their heart rate up.  Goal Area(s) Addressed: Patient will build on frustration tolerance skills. Patients will partake in a competitive play game with peers. Patients will gain knowledge of new leisure interest/hobby.    Affect/Mood: Appropriate   Participation Level: Minimal    Clinical Observations/Individualized Feedback: Marta was originally present in group. Pt came outside and sat on the concrete with her legs crossed beside peers. Pt chose not to sit in any of the chairs available. Pt was present for 15 minutes before going back inside. Pt did not return to the courtyard.   Plan: Continue to engage patient in RT group sessions 2-3x/week.   Deatrice Factor, LRT, CTRS 08/24/2023 5:00 PM

## 2023-08-25 DIAGNOSIS — F209 Schizophrenia, unspecified: Secondary | ICD-10-CM | POA: Diagnosis not present

## 2023-08-25 MED ORDER — HYDROXYZINE HCL 50 MG PO TABS: 50.0000 mg | ORAL_TABLET | Freq: Three times a day (TID) | ORAL | Status: DC | PRN

## 2023-08-25 NOTE — Plan of Care (Signed)
   Problem: Education: Goal: Knowledge of [place] [facility] Education information/materials will improve Outcome: Progressing Goal: Emotional status will improve Outcome: Progressing Goal: Mental status will improve Outcome: Progressing Goal: Verbalization of understanding the information provided will improve Outcome: Progressing   Problem: Activity: Goal: Interest or engagement in activities will improve Outcome: Progressing Goal: Sleeping patterns will improve Outcome: Progressing   Problem: Coping: Goal: Ability to verbalize frustrations and anger appropriately will improve Outcome: Progressing Goal: Ability to demonstrate self-control will improve Outcome: Progressing   Problem: Health Behavior/Discharge Planning: Goal: Identification of resources available to assist in meeting health care needs will improve Outcome: Progressing Goal: Compliance with treatment plan for underlying cause of condition will improve Outcome: Progressing   Problem: Physical Regulation: Goal: Ability to maintain clinical measurements within normal limits will improve Outcome: Progressing   Problem: Safety: Goal: Periods of time without injury will increase Outcome: Progressing   Problem: Activity: Goal: Will verbalize the importance of balancing activity with adequate rest periods Outcome: Progressing   Problem: Education: Goal: Will be free of psychotic symptoms Outcome: Progressing Goal: Knowledge of the prescribed therapeutic regimen will improve Outcome: Progressing   Problem: Coping: Goal: Coping ability will improve Outcome: Progressing Goal: Will verbalize feelings Outcome: Progressing   Problem: Health Behavior/Discharge Planning: Goal: Compliance with prescribed medication regimen will improve Outcome: Progressing   Problem: Nutritional: Goal: Ability to achieve adequate nutritional intake will improve Outcome: Progressing   Problem: Role Relationship: Goal:  Ability to communicate needs accurately will improve Outcome: Progressing Goal: Ability to interact with others will improve Outcome: Progressing   Problem: Safety: Goal: Ability to redirect hostility and anger into socially appropriate behaviors will improve Outcome: Progressing Goal: Ability to remain free from injury will improve Outcome: Progressing   Problem: Self-Care: Goal: Ability to participate in self-care as condition permits will improve Outcome: Progressing   Problem: Self-Concept: Goal: Will verbalize positive feelings about self Outcome: Progressing

## 2023-08-25 NOTE — Group Note (Signed)
 Date:  08/25/2023 Time:  12:59 PM  Group Topic/Focus:  Healthy Communication:   The focus of this group is to discuss communication, barriers to communication, as well as healthy ways to communicate with others. Personal Choices and Values:   The focus of this group is to help patients assess and explore the importance of values in their lives, how their values affect their decisions, how they express their values and what opposes their expression.    Participation Level:  Did Not Attend  Participation Quality:    Affect:    Cognitive:    Insight:   Engagement in Group:    Modes of Intervention:    Additional Comments:    Keyondre Hepburn L Bufford Helms 08/25/2023, 12:59 PM

## 2023-08-25 NOTE — Group Note (Signed)
 Date:  08/25/2023 Time:  6:04 PM  Group Topic/Focus:  Coping With Mental Health Crisis:   The purpose of this group is to help patients identify strategies for coping with mental health crisis.  Group discusses possible causes of crisis and ways to manage them effectively.  The purpose of this group is to help the patients identify that going outside and exercise is a great coping mechanism with mental health crisis. Healthy communication and team building exercises outside is a very therapeutic and healthy way to relieve stress.   Participation Level:  Did Not Attend  Participation Quality:    Affect:    Cognitive:    Insight:   Engagement in Group:    Modes of Intervention:    Additional Comments:    Erum Cercone L Kania Regnier 08/25/2023, 6:04 PM

## 2023-08-25 NOTE — Progress Notes (Cosign Needed Addendum)
 [facility] MD Progress Note  08/25/2023 2:27 PM Carly Brooks  MRN:  [id number]   Subjective:  Carly Brooks is a 28 y.o. female admitted: Presented to the ED on 08/15/2023  4:28 PM for suicidal Ideation without a plan. She carries the psychiatric diagnoses of schizophrenia, and depression Patient is admitted to adult psych unit with Q15 min safety monitoring. Multidisciplinary team approach is offered. Medication management; group/milieu therapy is offered. Patient continues to have delusions of grandeur believing she is a god and being able to read minds.   08/25/23: Chart reviewed.  Patient discussed in multidisciplinary rounds.  Patient seen for follow-up they are found in their bedroom.  They are alert and oriented they are pleasant and cooperative on exam.  They indicate they are ready for discharge tomorrow with plan to go home to their family.  They indicate they are excited to see their children.  They denied SI, HI, and AVH.  They note stable mood appetite and sleep.  They continue to note ongoing paranoia that the police are out to get them but again denied thoughts of harming himself or others.  There are no overt signs of mania or psychosis.  There been no recent incidents of self-harm or violence towards others.  Social work had already contacted family was agreeable with taking patient home yesterday will plan for patient discharged home with family tomorrow with appropriate follow-up.  Patient is agreeable with this plan. Depression 2/10, anxiety 4/10.   08/24/23: Chart reviewed patient discussed in multidisciplinary rounds.  Patient was seen for follow-up today with initial plan to discharge however patient indicated they do not feel they are ready they noted ongoing paranoia that the police were out to get them.  They denied SI HI, and AVH.  They noticed increased anxiety and are agreeable to medication adjustment.  Will titrate Prozac .  Will continue other meds as scheduled.   Denies adverse effects of the medication.  They are linear on exam not appear internally.  Patient has been medication compliant.  They received as needed trazodone  overnight.  08/23/23 On follow-up today patient is found in room they are awake and sitting calmly.  They are alert and oriented x 3.  They are more linear today.  Continue to be optimistic about discharge.  They report they have not had hallucinations x 2 days.  Still notes some difficulty with impulse control they did not require PRNs for agitation yesterday.  These as needed trazodone  overnight.  They continue to report tolerating the medication well.  They voiced no concerns or complaints. 08/22/23 On follow-up patient was found in their room they were sleeping but wake easily. They are alert and oriented x 3. They note stable appetite and sleep. Have some continued anxiety. Denied SI hi AVH. Have some continued thoughts that the police are out to get her. Is optimistic about going home and see her toddlers. Prefer the twice a day dosing of zyprexa . Denies adverse effects of meds, feels they are near baseline.     Sleep: Fair  Appetite:  Fair  Past Psychiatric History: see h&P Family History:  Family History  Problem Relation Age of Onset   Diabetes Mother    Social History:  Social History   Substance and Sexual Activity  Alcohol Use Not Currently   Alcohol/week: 2.0 standard drinks of alcohol   Types: 2 Glasses of wine per week     Social History   Substance and Sexual Activity  Drug Use Never  Social History   Socioeconomic History   Marital status: Single    Spouse name: Not on file   Number of children: Not on file   Years of education: Not on file   Highest education level: Not on file  Occupational History   Not on file  Tobacco Use   Smoking status: Never   Smokeless tobacco: Never   Tobacco comments:    "I smoke weed everyday"  Vaping Use   Vaping status: Never Used  Substance and Sexual  Activity   Alcohol use: Not Currently    Alcohol/week: 2.0 standard drinks of alcohol    Types: 2 Glasses of wine per week   Drug use: Never   Sexual activity: Yes    Partners: Male  Other Topics Concern   Not on file  Social History Narrative   Not on file   Social Drivers of Health   Financial Resource Strain: High Risk (05/23/2023)   Overall Financial Resource Strain (CARDIA)    Difficulty of Paying Living Expenses: Very hard  Food Insecurity: Patient Declined (08/17/2023)   Hunger Vital Sign    Worried About Running Out of Food in the Last Year: Patient declined    Ran Out of Food in the Last Year: Patient declined  Recent Concern: Food Insecurity - Food Insecurity Present (07/12/2023)   Hunger Vital Sign    Worried About Running Out of Food in the Last Year: Sometimes true    Ran Out of Food in the Last Year: Sometimes true  Transportation Needs: Patient Declined (08/17/2023)   PRAPARE - Administrator, Civil Service (Medical): Patient declined    Lack of Transportation (Non-Medical): Patient declined  Recent Concern: Transportation Needs - Unmet Transportation Needs (07/12/2023)   PRAPARE - Transportation    Lack of Transportation (Medical): Yes    Lack of Transportation (Non-Medical): Yes  Physical Activity: Inactive (05/23/2023)   Exercise Vital Sign    Days of Exercise per Week: 0 days    Minutes of Exercise per Session: 0 min  Stress: No Stress Concern Present (05/23/2023)   [employer] of Occupational Health - Occupational Stress Questionnaire    Feeling of Stress : Not at all  Social Connections: Patient Declined (08/17/2023)   Social Connection and Isolation Panel [NHANES]    Frequency of Communication with Friends and Family: Patient declined    Frequency of Social Gatherings with Friends and Family: Patient declined    Attends Religious Services: Patient declined    Database administrator or Organizations: Patient declined    Attends Tax inspector Meetings: Patient declined    Marital Status: Patient declined  Recent Concern: Social Connections - Moderately Isolated (05/23/2023)   Social Connection and Isolation Panel [NHANES]    Frequency of Communication with Friends and Family: Never    Frequency of Social Gatherings with Friends and Family: Never    Attends Religious Services: More than 4 times per year    Active Member of [employer] or Organizations: Yes    Attends Engineer, structural: More than 4 times per year    Marital Status: Never married   Past Medical History:  Past Medical History:  Diagnosis Date   Anxiety    Cluster B personality disorder (HCC)    Depression    Medical history non-contributory     Past Surgical History:  Procedure Laterality Date   CESAREAN SECTION     CESAREAN SECTION  04/18/2021   CESAREAN SECTION WITH BILATERAL  TUBAL LIGATION N/A 04/18/2021   Procedure: CESAREAN SECTION    Current Medications: Current Facility-Administered Medications  Medication Dose Route Frequency Provider Last Rate Last Admin   acetaminophen  (TYLENOL ) tablet 650 mg  650 mg Oral Q6H PRN Motley-Mangrum, Jadeka A, PMHNP       alum & mag hydroxide-simeth (MAALOX/MYLANTA) 200-200-20 MG/5ML suspension 30 mL  30 mL Oral Q4H PRN Motley-Mangrum, Jadeka A, PMHNP       FLUoxetine  (PROZAC ) capsule 40 mg  40 mg Oral Daily Daevon Holdren E, PA-C   40 mg at 08/25/23 1610   hydrOXYzine  (ATARAX ) tablet 50 mg  50 mg Oral Q8H PRN Aften Lipsey E, PA-C       magnesium  hydroxide (MILK OF MAGNESIA) suspension 30 mL  30 mL Oral Daily PRN Motley-Mangrum, Jadeka A, PMHNP       nicotine  (NICODERM CQ  - dosed in mg/24 hours) patch 21 mg  21 mg Transdermal Daily Motley-Mangrum, Jadeka A, PMHNP   21 mg at 08/25/23 0848   OLANZapine  (ZYPREXA ) tablet 10 mg  10 mg Oral BID Shrivastava, Aryendra, MD   10 mg at 08/25/23 0849   traZODone  (DESYREL ) tablet 50 mg  50 mg Oral QHS PRN Motley-Mangrum, Jadeka A, PMHNP   50 mg at  08/24/23 2113    Lab Results: No results found for this or any previous visit (from the past 48 hours).  Blood Alcohol level:  Lab Results  Component Value Date   [facility] <15 08/15/2023   ETH <15 07/28/2023    Metabolic Disorder Labs: Lab Results  Component Value Date   HGBA1C 5.0 07/28/2023   MPG 96.8 07/28/2023   MPG 93.93 06/09/2023   Lab Results  Component Value Date   PROLACTIN 14.2 07/12/2023   Lab Results  Component Value Date   CHOL 146 07/28/2023   TRIG 48 07/28/2023   HDL 41 07/28/2023   CHOLHDL 3.6 07/28/2023   VLDL 10 07/28/2023   LDLCALC 95 07/28/2023   LDLCALC 101 (H) 06/09/2023    Physical Findings: AIMS:  , ,  ,  ,    CIWA:    COWS:      Psychiatric Specialty Exam:  Presentation  General Appearance:  Casual  Eye Contact: Fair  Speech: Clear and Coherent  Speech Volume: Normal    Mood and Affect  Mood: Euthymic  Affect: Congruent   Thought Process  Thought Processes: Coherent  Descriptions of Associations:Intact  Orientation:Full (Time, Place and Person)  Thought Content:Paranoid Ideation  Hallucinations:Hallucinations: None  Ideas of Reference:Paranoia  Suicidal Thoughts:Suicidal Thoughts: No  Homicidal Thoughts:Homicidal Thoughts: No   Sensorium  Memory: Immediate Fair; Recent Fair  Judgment: Fair  Insight: Fair   Art therapist  Concentration: Good  Attention Span: Good  Recall: Fair  Fund of Knowledge: Fair  Language: Good   Psychomotor Activity  Psychomotor Activity: Psychomotor Activity: Normal  Musculoskeletal: Strength & Muscle Tone: within normal limits Gait & Station: normal Assets  Assets: Manufacturing systems engineer; Desire for Improvement    Physical Exam: Physical Exam Vitals and nursing note reviewed.  HENT:     Head: Atraumatic.  Eyes:     Extraocular Movements: Extraocular movements intact.  Pulmonary:     Effort: Pulmonary effort is normal.  Neurological:      Mental Status: She is alert and oriented to person, place, and time.  Psychiatric:        Mood and Affect: Mood is anxious.        Behavior: Behavior normal.  Thought Content: Thought content is paranoid. Thought content does not include homicidal or suicidal ideation.    Review of Systems  Psychiatric/Behavioral:  Negative for depression, hallucinations, substance abuse and suicidal ideas. The patient is nervous/anxious. The patient does not have insomnia.    Blood pressure 111/83, pulse 89, temperature 98.2 F (36.8 C), resp. rate 15, height 5\' 8"  (1.727 m), weight 116.6 kg, SpO2 100%, unknown if currently breastfeeding. Body mass index is 39.08 kg/m.  Diagnosis: Principal Problem:   Schizophrenia (HCC)   PLAN: Safety and Monitoring:  -- Voluntary admission to inpatient psychiatric unit for safety, stabilization and treatment  -- Daily contact with patient to assess and evaluate symptoms and progress in treatment  -- Patient's case to be discussed in multi-disciplinary team meeting  -- Observation Level : q15 minute checks  -- Vital signs:  q12 hours  -- Precautions: suicide, elopement, and assault -- Encouraged patient to participate in unit milieu and in scheduled group therapies  2. Psychiatric Diagnoses and Treatment:  Stable increase Prozac  to 40 continued Zyprexa  10 mg twice daily patient also also with Invega  sustain on board.  Will need to follow-up with outpatient provider regarding ongoing management.  They note to have some paranoia at baseline.  Today they note they feel better about going home they noted some anxiety over this last night.  They demonstrate good insight into the need for outpatient follow-up and medication compliance.    Continue zyprexa  10 mg bid  Continue Prozac  to 40 mg daily   -- The risks/benefits/side-effects/alternatives to this medication were discussed in detail with the patient and time was given for questions. The patient consents to  medication trial.                -- Metabolic profile and EKG monitoring obtained while on an atypical antipsychotic (BMI: Lipid Panel: HbgA1c: QTc:)              -- Encouraged patient to participate in unit milieu and in scheduled group therapies      3. Medical Issues Being Addressed:     No urgent medical needs identified at this time  4. Discharge Planning:    -- The patient has chronic paranoia at baseline and has been maintained on long-acting injectable (LAI) Invega  as part of their regimen. During this admission, they were also treated with scheduled Zyprexa . Despite ongoing symptoms of suspiciousness, the patient demonstrates no evidence of acute psychosis, mania, or significant affective instability. Behavioral presentation is consistent with baseline.  The patient has reached maximum clinical benefit from this episode of inpatient psychiatric hospitalization. There have been no new target symptoms identified, and medication adherence has been consistent.   Discharge planning is underway, with coordination for follow-up care to ensure medication continuity and support in the community.  -- Plan to discharge home tomorrow to family.             -- Social work and case management to assist with discharge planning and identification of hospital follow-up needs prior to discharge             -- Estimated LOS: 5-7 days             -- Discharge Concerns: Need to establish a safety plan; Medication compliance and effectiveness             -- Discharge Goals: Return home with outpatient referrals follow ups  Fay Hoop, PA-C 08/25/2023, 2:27 PM

## 2023-08-25 NOTE — Group Note (Signed)
 Date:  08/25/2023 Time:  1:28 PM  Group Topic/Focus:  Coping With Mental Health Crisis:   The purpose of this group is to help patients identify strategies for coping with mental health crisis.  Group discusses possible causes of crisis and ways to manage them effectively. Healthy Communication:   The focus of this group is to discuss communication, barriers to communication, as well as healthy ways to communicate with others.    Participation Level:  Did Not Attend  Participation Quality:    Affect:    Cognitive:    Insight:   Engagement in Group:    Modes of Intervention:    Additional Comments:    Latrese Carolan L Shondra Capps 08/25/2023, 1:28 PM

## 2023-08-25 NOTE — Plan of Care (Signed)
 Problem: Education: Goal: Knowledge of [place] [facility] Education information/materials will improve 08/25/2023 1456 by Derenda Flax, RN Outcome: Progressing 08/25/2023 1400 by Derenda Flax, RN Outcome: Progressing Goal: Emotional status will improve 08/25/2023 1456 by Derenda Flax, RN Outcome: Progressing 08/25/2023 1400 by Derenda Flax, RN Outcome: Progressing Goal: Mental status will improve 08/25/2023 1456 by Derenda Flax, RN Outcome: Progressing 08/25/2023 1400 by Derenda Flax, RN Outcome: Progressing Goal: Verbalization of understanding the information provided will improve 08/25/2023 1456 by Derenda Flax, RN Outcome: Progressing 08/25/2023 1400 by Derenda Flax, RN Outcome: Progressing   Problem: Activity: Goal: Interest or engagement in activities will improve 08/25/2023 1456 by Derenda Flax, RN Outcome: Progressing 08/25/2023 1400 by Derenda Flax, RN Outcome: Progressing Goal: Sleeping patterns will improve 08/25/2023 1456 by Derenda Flax, RN Outcome: Progressing 08/25/2023 1400 by Derenda Flax, RN Outcome: Progressing   Problem: Coping: Goal: Ability to verbalize frustrations and anger appropriately will improve 08/25/2023 1456 by Derenda Flax, RN Outcome: Progressing 08/25/2023 1400 by Derenda Flax, RN Outcome: Progressing Goal: Ability to demonstrate self-control will improve 08/25/2023 1456 by Derenda Flax, RN Outcome: Progressing 08/25/2023 1400 by Derenda Flax, RN Outcome: Progressing   Problem: Health Behavior/Discharge Planning: Goal: Identification of resources available to assist in meeting health care needs will improve 08/25/2023 1456 by Derenda Flax, RN Outcome: Progressing 08/25/2023 1400 by Derenda Flax, RN Outcome: Progressing Goal: Compliance with treatment plan for underlying cause of condition will improve 08/25/2023 1456 by Derenda Flax, RN Outcome: Progressing 08/25/2023  1400 by Derenda Flax, RN Outcome: Progressing   Problem: Physical Regulation: Goal: Ability to maintain clinical measurements within normal limits will improve 08/25/2023 1456 by Derenda Flax, RN Outcome: Progressing 08/25/2023 1400 by Derenda Flax, RN Outcome: Progressing   Problem: Safety: Goal: Periods of time without injury will increase 08/25/2023 1456 by Derenda Flax, RN Outcome: Progressing 08/25/2023 1400 by Derenda Flax, RN Outcome: Progressing   Problem: Activity: Goal: Will verbalize the importance of balancing activity with adequate rest periods 08/25/2023 1456 by Derenda Flax, RN Outcome: Progressing 08/25/2023 1400 by Derenda Flax, RN Outcome: Progressing   Problem: Education: Goal: Will be free of psychotic symptoms 08/25/2023 1456 by Derenda Flax, RN Outcome: Progressing 08/25/2023 1400 by Derenda Flax, RN Outcome: Progressing Goal: Knowledge of the prescribed therapeutic regimen will improve 08/25/2023 1456 by Derenda Flax, RN Outcome: Progressing 08/25/2023 1400 by Derenda Flax, RN Outcome: Progressing   Problem: Coping: Goal: Coping ability will improve 08/25/2023 1456 by Derenda Flax, RN Outcome: Progressing 08/25/2023 1400 by Derenda Flax, RN Outcome: Progressing Goal: Will verbalize feelings 08/25/2023 1456 by Derenda Flax, RN Outcome: Progressing 08/25/2023 1400 by Derenda Flax, RN Outcome: Progressing   Problem: Health Behavior/Discharge Planning: Goal: Compliance with prescribed medication regimen will improve 08/25/2023 1456 by Derenda Flax, RN Outcome: Progressing 08/25/2023 1400 by Derenda Flax, RN Outcome: Progressing   Problem: Nutritional: Goal: Ability to achieve adequate nutritional intake will improve 08/25/2023 1456 by Derenda Flax, RN Outcome: Progressing 08/25/2023 1400 by Derenda Flax, RN Outcome: Progressing   Problem: Role Relationship: Goal: Ability to  communicate needs accurately will improve 08/25/2023 1456 by Derenda Flax, RN Outcome: Progressing 08/25/2023 1400 by Derenda Flax, RN Outcome: Progressing Goal: Ability to interact with others will improve 08/25/2023 1456 by Derenda Flax, RN Outcome: Progressing 08/25/2023 1400 by  Derenda Flax, RN Outcome: Progressing   Problem: Safety: Goal: Ability to redirect hostility and anger into socially appropriate behaviors will improve 08/25/2023 1456 by Derenda Flax, RN Outcome: Progressing 08/25/2023 1400 by Derenda Flax, RN Outcome: Progressing Goal: Ability to remain free from injury will improve 08/25/2023 1456 by Derenda Flax, RN Outcome: Progressing 08/25/2023 1400 by Derenda Flax, RN Outcome: Progressing   Problem: Self-Care: Goal: Ability to participate in self-care as condition permits will improve 08/25/2023 1456 by Derenda Flax, RN Outcome: Progressing 08/25/2023 1400 by Derenda Flax, RN Outcome: Progressing   Problem: Self-Concept: Goal: Will verbalize positive feelings about self 08/25/2023 1456 by Derenda Flax, RN Outcome: Progressing 08/25/2023 1400 by Derenda Flax, RN Outcome: Progressing

## 2023-08-25 NOTE — Group Note (Signed)
 Date:  08/25/2023 Time:  8:47 PM  Group Topic/Focus:  Wrap-Up Group:   The focus of this group is to help patients review their daily goal of treatment and discuss progress on daily workbooks.    Participation Level:  Did Not Attend  Participation Quality:  none  Affect:  none  Cognitive:  none  Insight: None  Engagement in Group:  none  Modes of Intervention:  none  Additional Comments:  none   [name] 08/25/2023, 8:47 PM

## 2023-08-25 NOTE — Progress Notes (Signed)
   08/25/23 0849  Psych Admission Type (Psych Patients Only)  Admission Status Voluntary  Psychosocial Assessment  Patient Complaints None  Eye Contact Brief  Facial Expression Flat  Affect Appropriate to circumstance  Speech Logical/coherent  Interaction Minimal  Motor Activity Slow  Appearance/Hygiene In scrubs;Unremarkable  Behavior Characteristics Calm;Cooperative  Mood Pleasant  Aggressive Behavior  Effect No apparent injury  Thought Process  Coherency WDL  Content WDL  Delusions None reported or observed  Perception WDL  Hallucination None reported or observed  Judgment WDL  Confusion WDL  Danger to Self  Current suicidal ideation? Denies  Description of Suicide Plan no plan expressed  Self-Injurious Behavior No self-injurious ideation or behavior indicators observed or expressed   Agreement Not to Harm Self Yes  Description of Agreement verbal  Danger to Others  Danger to Others None reported or observed

## 2023-08-26 DIAGNOSIS — F209 Schizophrenia, unspecified: Secondary | ICD-10-CM | POA: Diagnosis not present

## 2023-08-26 MED ORDER — FLUOXETINE HCL 40 MG PO CAPS
40.0000 mg | ORAL_CAPSULE | Freq: Every day | ORAL | 0 refills | Status: DC
  Filled 2023-08-26: qty 30, 30d supply, fill #0

## 2023-08-26 MED ORDER — NICOTINE 21 MG/24HR TD PT24: 21.0000 mg | MEDICATED_PATCH | Freq: Every day | TRANSDERMAL | 0 refills | Status: DC

## 2023-08-26 MED ORDER — TRAZODONE HCL 50 MG PO TABS: 50.0000 mg | ORAL_TABLET | Freq: Every evening | ORAL | Status: DC | PRN

## 2023-08-26 MED ORDER — OLANZAPINE 10 MG PO TABS: 10.0000 mg | ORAL_TABLET | Freq: Two times a day (BID) | ORAL | 0 refills | Status: AC

## 2023-08-26 MED ORDER — NICOTINE 21 MG/24HR TD PT24
21.0000 mg | MEDICATED_PATCH | Freq: Every day | TRANSDERMAL | 0 refills | Status: DC
  Filled 2023-08-26: qty 14, 14d supply, fill #0

## 2023-08-26 MED ORDER — OLANZAPINE 10 MG PO TABS
10.0000 mg | ORAL_TABLET | Freq: Two times a day (BID) | ORAL | 0 refills | Status: DC
  Filled 2023-08-26: qty 60, 30d supply, fill #0

## 2023-08-26 MED ORDER — FLUOXETINE HCL 40 MG PO CAPS: 40.0000 mg | ORAL_CAPSULE | Freq: Every day | ORAL | 0 refills | Status: AC

## 2023-08-26 NOTE — Progress Notes (Signed)
   08/26/23 0938  Psych Admission Type (Psych Patients Only)  Admission Status Voluntary  Psychosocial Assessment  Patient Complaints None  Eye Contact Brief  Facial Expression Fixed smile  Affect Anxious  Speech Logical/coherent  Interaction Minimal  Motor Activity Slow  Appearance/Hygiene Unremarkable;In scrubs  Behavior Characteristics Cooperative;Appropriate to situation  Mood Sad  Aggressive Behavior  Effect No apparent injury  Thought Process  Coherency WDL  Content WDL  Delusions None reported or observed  Perception WDL  Hallucination None reported or observed  Judgment WDL  Confusion WDL  Danger to Self  Current suicidal ideation? Denies  Agreement Not to Harm Self Yes  Description of Agreement verbal  Danger to Others  Danger to Others None reported or observed

## 2023-08-26 NOTE — Progress Notes (Signed)
 In bed on engagement, endorsed depression and anxiety both rated 5/10.  Trigger: fear of going home; states, "I have credit card debts and the police are after me.:  Support on D/C "[employer]."    08/25/23 2200  Psych Admission Type (Psych Patients Only)  Admission Status Voluntary  Psychosocial Assessment  Patient Complaints None  Eye Contact Brief  Facial Expression Flat  Affect Anxious  Speech Logical/coherent  Interaction Minimal  Motor Activity Slow  Appearance/Hygiene In scrubs;Unremarkable  Behavior Characteristics Cooperative;Appropriate to situation  Mood Pleasant  Aggressive Behavior  Effect No apparent injury  Thought Process  Coherency WDL  Content WDL  Delusions None reported or observed  Perception WDL  Hallucination None reported or observed  Judgment WDL  Confusion WDL  Danger to Self  Current suicidal ideation? Denies  Agreement Not to Harm Self Yes  Description of Agreement Verbal  Danger to Others  Danger to Others None reported or observed    Problem: Education: Goal: Knowledge of  General Education information/materials will improve Outcome: Progressing Goal: Emotional status will improve Outcome: Progressing Goal: Mental status will improve Outcome: Progressing Goal: Verbalization of understanding the information provided will improve Outcome: Progressing   Problem: Activity: Goal: Interest or engagement in activities will improve Outcome: Progressing Goal: Sleeping patterns will improve Outcome: Progressing   Problem: Coping: Goal: Ability to verbalize frustrations and anger appropriately will improve Outcome: Progressing Goal: Ability to demonstrate self-control will improve Outcome: Progressing

## 2023-08-26 NOTE — [facility] Suicide Risk Assessment (Signed)
 [facility] Discharge Suicide Risk Assessment   Principal Problem: Schizophrenia [facility]) Discharge Diagnoses: Principal Problem:   Schizophrenia (HCC)   Total Time spent with patient: 1 hour  Musculoskeletal: Strength & Muscle Tone: within normal limits Gait & Station: normal Patient leans: N/A  Psychiatric Specialty Exam  Presentation  General Appearance:  Casual  Eye Contact: Fair  Speech: Clear and Coherent  Speech Volume: Normal  Handedness: Right   Mood and Affect  Mood: Euthymic  Duration of Depression Symptoms: N/A  Affect: Congruent   Thought Process  Thought Processes: Coherent  Descriptions of Associations:Intact  Orientation:Full (Time, Place and Person)  Thought Content:Paranoid Ideation  History of Schizophrenia/Schizoaffective disorder:Yes  Duration of Psychotic Symptoms:Greater than six months  Hallucinations:Hallucinations: None  Ideas of Reference:Paranoia  Suicidal Thoughts:Suicidal Thoughts: No  Homicidal Thoughts:Homicidal Thoughts: No   Sensorium  Memory: Immediate Fair; Recent Fair  Judgment: Fair  Insight: Fair   Executive Functions  Concentration: Good  Attention Span: Good  Recall: Fair  Fund of Knowledge: Fair  Language: Good   Psychomotor Activity  Psychomotor Activity: Psychomotor Activity: Normal   Assets  Assets: Communication Skills; Desire for Improvement   Sleep  Sleep: Sleep: Good   Physical Exam: Physical Exam Vitals and nursing note reviewed.  HENT:     Head: Atraumatic.  Eyes:     Extraocular Movements: Extraocular movements intact.  Pulmonary:     Effort: Pulmonary effort is normal.  Neurological:     Mental Status: She is alert and oriented to person, place, and time.    Review of Systems  Psychiatric/Behavioral:  Negative for depression, hallucinations, substance abuse and suicidal ideas. The patient is nervous/anxious. The patient does not have insomnia.     Blood pressure 104/69, pulse 70, temperature 98.1 F (36.7 C), resp. rate 14, height 5\' 8"  (1.727 m), weight 116.6 kg, SpO2 100%, unknown if currently breastfeeding. Body mass index is 39.08 kg/m.  Mental Status Per Nursing Assessment::   On Admission:  NA  Demographic Factors:  Low socioeconomic status  Loss Factors: NA  Historical Factors: Impulsivity  Risk Reduction Factors:   Living with another person, especially a relative, Positive social support, and Positive therapeutic relationship  Continued Clinical Symptoms:  Previous Psychiatric Diagnoses and Treatments  Cognitive Features That Contribute To Risk:  Loss of executive function    Suicide Risk:  Minimal: No identifiable suicidal ideation.  Patients presenting with no risk factors but with morbid ruminations; may be classified as minimal risk based on the severity of the depressive symptoms   Follow-up Information     [facility]. Go to.   Why: In person assessmen for therapy and medication management is 08/30/23 at 1:45 PM. Contact information: [address]  [address] [place] [address] [phone]                 Plan Of Care/Follow-up recommendations:  # It is recommended to the patient to continue psychiatric medications as prescribed, after discharge from the hospital.   # It is recommended to the patient to follow up with your outpatient psychiatric provider and PCP. # It was discussed with the patient, the impact of alcohol, drugs, tobacco have been there overall psychiatric and medical wellbeing, and total abstinence from substance use was recommended. # Prescriptions provided or sent directly to preferred pharmacy at discharge. Patient agreeable to plan. Given the opportunity to ask questions. Appears to feel comfortable with discharge.  # In the event of worsening symptoms, the patient is instructed to call  the crisis hotline (988), 911 and or go to the nearest ED for appropriate  evaluation and treatment of symptoms. To follow-up with primary care provider for other medical issues, concerns and or health care needs # Patient was discharged home as requested with a plan to follow up as noted above.    Fay Hoop, PA-C 08/26/2023, 1:10 PM

## 2023-08-26 NOTE — Plan of Care (Signed)
 Problem: Education: Goal: Knowledge of [facility] Education information/materials will improve 08/26/2023 1007 by Derenda Flax, RN Outcome: Progressing 08/26/2023 1007 by Derenda Flax, RN Outcome: Progressing Goal: Emotional status will improve 08/26/2023 1007 by Derenda Flax, RN Outcome: Progressing 08/26/2023 1007 by Derenda Flax, RN Outcome: Progressing Goal: Mental status will improve 08/26/2023 1007 by Derenda Flax, RN Outcome: Progressing 08/26/2023 1007 by Derenda Flax, RN Outcome: Progressing Goal: Verbalization of understanding the information provided will improve 08/26/2023 1007 by Derenda Flax, RN Outcome: Progressing 08/26/2023 1007 by Derenda Flax, RN Outcome: Progressing   Problem: Activity: Goal: Interest or engagement in activities will improve 08/26/2023 1007 by Derenda Flax, RN Outcome: Progressing 08/26/2023 1007 by Derenda Flax, RN Outcome: Progressing Goal: Sleeping patterns will improve 08/26/2023 1007 by Derenda Flax, RN Outcome: Progressing 08/26/2023 1007 by Derenda Flax, RN Outcome: Progressing   Problem: Coping: Goal: Ability to verbalize frustrations and anger appropriately will improve 08/26/2023 1007 by Derenda Flax, RN Outcome: Progressing 08/26/2023 1007 by Derenda Flax, RN Outcome: Progressing Goal: Ability to demonstrate self-control will improve 08/26/2023 1007 by Derenda Flax, RN Outcome: Progressing 08/26/2023 1007 by Derenda Flax, RN Outcome: Progressing   Problem: Health Behavior/Discharge Planning: Goal: Identification of resources available to assist in meeting health care needs will improve 08/26/2023 1007 by Derenda Flax, RN Outcome: Progressing 08/26/2023 1007 by Derenda Flax, RN Outcome: Progressing Goal: Compliance with treatment plan for underlying cause of condition will improve 08/26/2023 1007 by Derenda Flax, RN Outcome: Progressing 08/26/2023  1007 by Derenda Flax, RN Outcome: Progressing   Problem: Physical Regulation: Goal: Ability to maintain clinical measurements within normal limits will improve 08/26/2023 1007 by Derenda Flax, RN Outcome: Progressing 08/26/2023 1007 by Derenda Flax, RN Outcome: Progressing   Problem: Safety: Goal: Periods of time without injury will increase 08/26/2023 1007 by Derenda Flax, RN Outcome: Progressing 08/26/2023 1007 by Derenda Flax, RN Outcome: Progressing   Problem: Activity: Goal: Will verbalize the importance of balancing activity with adequate rest periods 08/26/2023 1007 by Derenda Flax, RN Outcome: Progressing 08/26/2023 1007 by Derenda Flax, RN Outcome: Progressing   Problem: Education: Goal: Will be free of psychotic symptoms 08/26/2023 1007 by Derenda Flax, RN Outcome: Progressing 08/26/2023 1007 by Derenda Flax, RN Outcome: Progressing Goal: Knowledge of the prescribed therapeutic regimen will improve 08/26/2023 1007 by Derenda Flax, RN Outcome: Progressing 08/26/2023 1007 by Derenda Flax, RN Outcome: Progressing   Problem: Coping: Goal: Coping ability will improve 08/26/2023 1007 by Derenda Flax, RN Outcome: Progressing 08/26/2023 1007 by Derenda Flax, RN Outcome: Progressing Goal: Will verbalize feelings 08/26/2023 1007 by Derenda Flax, RN Outcome: Progressing 08/26/2023 1007 by Derenda Flax, RN Outcome: Progressing   Problem: Health Behavior/Discharge Planning: Goal: Compliance with prescribed medication regimen will improve 08/26/2023 1007 by Derenda Flax, RN Outcome: Progressing 08/26/2023 1007 by Derenda Flax, RN Outcome: Progressing   Problem: Nutritional: Goal: Ability to achieve adequate nutritional intake will improve 08/26/2023 1007 by Derenda Flax, RN Outcome: Progressing 08/26/2023 1007 by Derenda Flax, RN Outcome: Progressing   Problem: Role Relationship: Goal: Ability to  communicate needs accurately will improve 08/26/2023 1007 by Derenda Flax, RN Outcome: Progressing 08/26/2023 1007 by Derenda Flax, RN Outcome: Progressing Goal: Ability to interact with others will improve 08/26/2023 1007 by Derenda Flax, RN Outcome: Progressing 08/26/2023 1007 by  Derenda Flax, RN Outcome: Progressing   Problem: Safety: Goal: Ability to redirect hostility and anger into socially appropriate behaviors will improve 08/26/2023 1007 by Derenda Flax, RN Outcome: Progressing 08/26/2023 1007 by Derenda Flax, RN Outcome: Progressing Goal: Ability to remain free from injury will improve 08/26/2023 1007 by Derenda Flax, RN Outcome: Progressing 08/26/2023 1007 by Derenda Flax, RN Outcome: Progressing   Problem: Self-Care: Goal: Ability to participate in self-care as condition permits will improve 08/26/2023 1007 by Derenda Flax, RN Outcome: Progressing 08/26/2023 1007 by Derenda Flax, RN Outcome: Progressing   Problem: Self-Concept: Goal: Will verbalize positive feelings about self 08/26/2023 1007 by Derenda Flax, RN Outcome: Progressing 08/26/2023 1007 by Derenda Flax, RN Outcome: Progressing

## 2023-08-26 NOTE — Discharge Summary (Signed)
 Physician Discharge Summary Note  Patient:  Carly Brooks is an 28 y.o., female MRN:  [id number] DOB:  18-Nov-1995 Patient phone:  [phone] (home)  Patient address:   [address] [place] [facility] [address],    Date of Admission:  08/16/2023 Date of Discharge: 08/26/2023  Reason for Admission:   Carly Brooks is a 28 y.o. female admitted: Presented to the ED on 08/15/2023  4:28 PM for suicidal Ideation without a plan. She carries the psychiatric diagnoses of schizophrenia, and depression Patient is admitted to adult psych unit with Q15 min safety monitoring. Multidisciplinary team approach is offered. Medication management; group/milieu therapy is offered.   Principal Problem: Schizophrenia [facility]) Discharge Diagnoses: Principal Problem:   Schizophrenia [facility])  Past Psychiatric History:  Psychiatric History:  Information collected from patient/chart   Prev Dx/Sx: Depression and anxiety Current Psych Provider: None reported Home Meds (current): Multiple psychotropic medications patient is unable to recall Previous Med Trials: Abilify , Risperdal , Invega  Sustenna, Zyprexa  Therapy: None reported   Prior Psych Hospitalization: Multiple times Prior Self Harm: None reported Prior Violence: Denies   Family Psych History: Mom with depression and anxiety Family Hx suicide: Denies   Social History:  Developmental Hx: Unknown Educational Hx: High school Occupational Hx: On disability Legal Hx: Has court hearing for simple assault Living Situation: With the father of her children Spiritual Hx: Denies Access to weapons/lethal means: Denies   Substance History Alcohol: None reported   Tobacco: None reported Illicit drugs: None reported Prescription drug abuse: None reported Rehab hx: None reported Social History:  Social History   Substance and Sexual Activity  Alcohol Use Not Currently   Alcohol/week: 2.0 standard drinks of alcohol   Types: 2  Glasses of wine per week     Social History   Substance and Sexual Activity  Drug Use Never    Social History   Socioeconomic History   Marital status: Single    Spouse name: Not on file   Number of children: Not on file   Years of education: Not on file   Highest education level: Not on file  Occupational History   Not on file  Tobacco Use   Smoking status: Never   Smokeless tobacco: Never   Tobacco comments:    "I smoke weed everyday"  Vaping Use   Vaping status: Never Used  Substance and Sexual Activity   Alcohol use: Not Currently    Alcohol/week: 2.0 standard drinks of alcohol    Types: 2 Glasses of wine per week   Drug use: Never   Sexual activity: Yes    Partners: Male  Other Topics Concern   Not on file  Social History Narrative   Not on file   Social Drivers of Health   Financial Resource Strain: High Risk (05/23/2023)   Overall Financial Resource Strain (CARDIA)    Difficulty of Paying Living Expenses: Very hard  Food Insecurity: Patient Declined (08/17/2023)   Hunger Vital Sign    Worried About Running Out of Food in the Last Year: Patient declined    Ran Out of Food in the Last Year: Patient declined  Recent Concern: Food Insecurity - Food Insecurity Present (07/12/2023)   Hunger Vital Sign    Worried About Running Out of Food in the Last Year: Sometimes true    Ran Out of Food in the Last Year: Sometimes true  Transportation Needs: Patient Declined (08/17/2023)   PRAPARE - Administrator, Civil Service (Medical): Patient declined  Lack of Transportation (Non-Medical): Patient declined  Recent Concern: Transportation Needs - Unmet Transportation Needs (07/12/2023)   PRAPARE - Transportation    Lack of Transportation (Medical): Yes    Lack of Transportation (Non-Medical): Yes  Physical Activity: Inactive (05/23/2023)   Exercise Vital Sign    Days of Exercise per Week: 0 days    Minutes of Exercise per Session: 0 min  Stress: No Stress  Concern Present (05/23/2023)   [employer] of Occupational Health - Occupational Stress Questionnaire    Feeling of Stress : Not at all  Social Connections: Patient Declined (08/17/2023)   Social Connection and Isolation Panel [NHANES]    Frequency of Communication with Friends and Family: Patient declined    Frequency of Social Gatherings with Friends and Family: Patient declined    Attends Religious Services: Patient declined    Database administrator or Organizations: Patient declined    Attends Banker Meetings: Patient declined    Marital Status: Patient declined  Recent Concern: Social Connections - Moderately Isolated (05/23/2023)   Social Connection and Isolation Panel [NHANES]    Frequency of Communication with Friends and Family: Never    Frequency of Social Gatherings with Friends and Family: Never    Attends Religious Services: More than 4 times per year    Active Member of [employer] or Organizations: Yes    Attends Engineer, structural: More than 4 times per year    Marital Status: Never married   Past Medical History:  Past Medical History:  Diagnosis Date   Anxiety    Cluster B personality disorder (HCC)    Depression    Medical history non-contributory     Past Surgical History:  Procedure Laterality Date   CESAREAN SECTION     CESAREAN SECTION  04/18/2021   CESAREAN SECTION WITH BILATERAL TUBAL LIGATION N/A 04/18/2021   Procedure: CESAREAN SECTION   Family History:  Family History  Problem Relation Age of Onset   Diabetes Mother     Hospital Course:  The patient presented voluntarily to the ED with passive suicidal ideation, reporting auditory hallucinations instructing her to kill herself. She had recently been discharged from [facility] following a two-week inpatient stay and felt she was discharged prematurely. On arrival, she endorsed visual hallucinations, paranoid delusions regarding law enforcement, and delusions of grandeur  (believing she is a god and can read minds). She was admitted to the adult psychiatric unit with Q15 safety monitoring.  Throughout hospitalization, the patient displayed periods of disorganization, internal preoccupation, and transient behavioral dysregulation. These symptoms were managed through medication adjustments, including:  Discontinuation of Risperidone  3 mg BID  Continuation of Invega  Sustenna LAI (administered 08/07/2023)  Zyprexa  increased from 10 mg QHS to 20 mg daily, later split to 10 mg BID  Prozac  titrated from 20 mg to 40 mg daily for depressive symptoms  It is intended for the outpatient provider to determine whether to continue these medications, or if these medication needs to be titrated for continued outpatient therapy. All identified psychiatric, general medical/surgical psychosocial obstacles to discharge were addressed. Patient tolerated these medications with no noted side effects.  The patient gradually improved with treatment. The patient carries a chronic diagnosis of schizophrenia and presents with longstanding symptoms of paranoia and disorganized behavior consistent with their baseline. While the patient continued to exhibit baseline paranoid ideation and intermittent disorganization, no acute exacerbation of psychosis was observed during hospitalization.  There were no new safety concerns or behavioral escalations during  the admission. The patient engaged with care to the extent consistent with their baseline functioning. Given that the patient has reached maximum therapeutic benefit from inpatient psychiatric care, and is not currently exhibiting overt psychosis or presenting as a danger to self or others. Over the final days of admission, she denied suicidal or homicidal ideation, as well as hallucinations, though continued to report residual paranoia (e.g., belief that police are monitoring her). She expressed optimism about discharge and a desire to reunite with  her young children. She became more linear and interactive on exam.  Follow-up will focus on continued medication adherence and supportive outpatient management. Collateral contacted to determine baseline behaviors and for safe discharge plan.   On the day of discharge, following sustained improvement in the affect of this patient, repeated denial of suicidal, homicidal and other violent ideations, adequate interaction with peers, participation in groups while on the unit, and denial of adverse reactions from the medications, the treatment team decided that Pt was stable for discharge with scheduled mental health treatment as below. A comprehensive risk assessment was done prior to discharge and shows that patient is at low risk for suicide or violence. At the time of discharge, patient no longer meeting criteria for IVC, patient is not an imminent danger to self or others. patient agrees to call Crisis Services, 911 and/or return to the ED if safety cannot be maintained outside the hospital setting. Discharge medications reviewed with patient, explanation of indication, risks/benefits and side effects profiles. The patient verbalized understanding and is in agreement with the discharge plan.       Physical Findings: AIMS:  , ,  ,  ,    CIWA:    COWS:        Psychiatric Specialty Exam:  Presentation  General Appearance:  Casual  Eye Contact: Fair  Speech: Clear and Coherent  Speech Volume: Normal    Mood and Affect  Mood: Euthymic  Affect: Congruent   Thought Process  Thought Processes: Coherent  Descriptions of Associations:Intact  Orientation:Full (Time, Place and Person)  Thought Content:Paranoid Ideation  Hallucinations:Hallucinations: None  Ideas of Reference:Paranoia  Suicidal Thoughts:Suicidal Thoughts: No  Homicidal Thoughts:Homicidal Thoughts: No   Sensorium  Memory: Immediate Fair; Recent  Fair  Judgment: Fair  Insight: Fair   Art therapist  Concentration: Good  Attention Span: Good  Recall: Fair  Fund of Knowledge: Fair  Language: Good   Psychomotor Activity  Psychomotor Activity: Psychomotor Activity: Normal  Musculoskeletal: Strength & Muscle Tone: within normal limits Gait & Station: normal Assets  Assets: Manufacturing systems engineer; Desire for Improvement   Sleep  Sleep: Sleep: Good    Physical Exam: Physical Exam Vitals and nursing note reviewed.  HENT:     Head: Atraumatic.  Eyes:     Extraocular Movements: Extraocular movements intact.  Pulmonary:     Effort: Pulmonary effort is normal.  Neurological:     Mental Status: She is alert and oriented to person, place, and time.  Psychiatric:        Mood and Affect: Mood normal.    Review of Systems  Psychiatric/Behavioral:  Negative for hallucinations, substance abuse and suicidal ideas. The patient is nervous/anxious. The patient does not have insomnia.    Blood pressure 104/69, pulse 70, temperature 98.1 F (36.7 C), resp. rate 14, height 5\' 8"  (1.727 m), weight 116.6 kg, SpO2 100%, unknown if currently breastfeeding. Body mass index is 39.08 kg/m.   Social History   Tobacco Use  Smoking Status Never  Smokeless Tobacco Never  Tobacco Comments   "I smoke weed everyday"   Tobacco Cessation:  A prescription for an FDA-approved tobacco cessation medication provided at discharge   Blood Alcohol level:  Lab Results  Component Value Date   [facility] <15 08/15/2023   ETH <15 07/28/2023    Metabolic Disorder Labs:  Lab Results  Component Value Date   HGBA1C 5.0 07/28/2023   MPG 96.8 07/28/2023   MPG 93.93 06/09/2023   Lab Results  Component Value Date   PROLACTIN 14.2 07/12/2023   Lab Results  Component Value Date   CHOL 146 07/28/2023   TRIG 48 07/28/2023   HDL 41 07/28/2023   CHOLHDL 3.6 07/28/2023   VLDL 10 07/28/2023   LDLCALC 95 07/28/2023   LDLCALC 101  (H) 06/09/2023    See Psychiatric Specialty Exam and Suicide Risk Assessment completed by Attending Physician prior to discharge.  Discharge destination:  Home  Is patient on multiple antipsychotic therapies at discharge:  Yes,   Do you recommend tapering to monotherapy for antipsychotics?  Patient is on zyprexa  oral and will be due invega  sustenna injection later in the month. It is up to the out patient provider to determine if pt requires continuation of both   There have been three or more failed antipsychotic trials risperdal , abilify , invega , seroquel   Recommended Plan for Multiple Antipsychotic Therapies: Patient is on zyprexa  oral and will be due invega  sustenna injection later in the month. It is up to the out patient provider to determine if pt requires continuation of both    Discharge Instructions     Diet - low sodium heart healthy   Complete by: As directed    Increase activity slowly   Complete by: As directed       Allergies as of 08/26/2023   No Known Allergies      Medication List     STOP taking these medications    ARIPiprazole  20 MG tablet Commonly known as: ABILIFY    famotidine 20 MG tablet Commonly known as: PEPCID   hydrOXYzine  25 MG capsule Commonly known as: VISTARIL    QUEtiapine  200 MG tablet Commonly known as: SEROQUEL    risperiDONE  3 MG tablet Commonly known as: RISPERDAL        TAKE these medications      Indication  FLUoxetine  40 MG capsule Commonly known as: PROZAC  Take 1 capsule (40 mg total) by mouth daily. Start taking on: August 27, 2023 What changed:  medication strength how much to take  Indication: Depression   nicotine  21 mg/24hr patch Commonly known as: NICODERM CQ  - dosed in mg/24 hours Place 1 patch (21 mg total) onto the skin daily. Start taking on: August 27, 2023  Indication: Nicotine  Addiction   OLANZapine  10 MG tablet Commonly known as: ZYPREXA  Take 1 tablet (10 mg total) by mouth 2 (two) times daily. What  changed: when to take this  Indication: Schizophrenia   paliperidone  156 MG/ML Susy injection Commonly known as: INVEGA  SUSTENNA Inject 1 mL (156 mg total) into the muscle every 28 (twenty-eight) days. Patient last received on 07/10/23 and is due for next LAI maintenance dose of 156 mg on 08/07/23  Indication: Schizophrenia   traZODone  50 MG tablet Commonly known as: DESYREL  Take 1 tablet (50 mg total) by mouth at bedtime as needed for sleep.  Indication: Trouble Sleeping        Follow-up [employer]. Go to.   Why: In person assessmen for therapy and medication  management is 08/30/23 at 1:45 PM. Contact information: [address]  [address] [place] [address] [phone]                 Follow-up recommendations:  # It is recommended to the patient to continue psychiatric medications as prescribed, after discharge from the hospital.   # It is recommended to the patient to follow up with your outpatient psychiatric provider and PCP. # It was discussed with the patient, the impact of alcohol, drugs, tobacco have been there overall psychiatric and medical wellbeing, and total abstinence from substance use was recommended. # Prescriptions provided or sent directly to preferred pharmacy at discharge. Patient agreeable to plan. Given the opportunity to ask questions. Appears to feel comfortable with discharge.  # In the event of worsening symptoms, the patient is instructed to call the crisis hotline (988), 911 and or go to the nearest ED for appropriate evaluation and treatment of symptoms. To follow-up with primary care provider for other medical issues, concerns and or health care needs # Patient was discharged home as requested with a plan to follow up as noted above.      Signed: Fay Hoop, PA-C 08/26/2023, 1:12 PM

## 2023-08-26 NOTE — Group Note (Signed)
 Date:  08/26/2023 Time:  11:58 AM  Group Topic/Focus:  Goals Group:   The focus of this group is to help patients establish daily goals to achieve during treatment and discuss how the patient can incorporate goal setting into their daily lives to aide in recovery.    Participation Level:    Participation Quality:    Affect:    Cognitive:    Insight:   Engagement in Group:    Modes of Intervention:    Additional Comments:    Zakar Brosch 08/26/2023, 11:58 AM

## 2023-08-27 ENCOUNTER — Other Ambulatory Visit (HOSPITAL_COMMUNITY): Payer: Self-pay

## 2023-09-02 ENCOUNTER — Ambulatory Visit (HOSPITAL_COMMUNITY): Admission: EM | Admit: 2023-09-02 | Discharge: 2023-09-02 | Discharge status: Against Medical Advice

## 2023-09-06 ENCOUNTER — Ambulatory Visit (HOSPITAL_COMMUNITY)
Admission: EM | Admit: 2023-09-06 | Discharge: 2023-09-06 | Disposition: A | Discharge status: Home / Self Care | Attending: Psychiatry | Admitting: Psychiatry

## 2023-09-06 DIAGNOSIS — Z760 Encounter for issue of repeat prescription: Secondary | ICD-10-CM

## 2023-09-06 DIAGNOSIS — R441 Visual hallucinations: Secondary | ICD-10-CM | POA: Diagnosis present

## 2023-09-06 DIAGNOSIS — Z9151 Personal history of suicidal behavior: Secondary | ICD-10-CM | POA: Diagnosis present

## 2023-09-06 DIAGNOSIS — F259 Schizoaffective disorder, unspecified: Secondary | ICD-10-CM

## 2023-09-06 DIAGNOSIS — Z79899 Other long term (current) drug therapy: Secondary | ICD-10-CM | POA: Insufficient documentation

## 2023-09-06 NOTE — Discharge Instructions (Addendum)
 Discharge recommendations:   Pharmacy: [facility] on [address] [place], [place] has your medication ready to pick up.   Medications: You have medications at the pharmacy.  Please take the Olanzapine  10 mg twice a day (in the morning and at bedtime).  Please take the Trazodone  50 mg as needed for sleep at bedtime.  Continue to take Prozac  40 mg every morning.  Patient is to take medications as prescribed. The patient or patient's guardian is to contact a medical professional and/or outpatient provider to address any new side effects that develop. The patient or the patient's guardian should update outpatient providers of any new medications and/or medication changes.    Outpatient Follow up: Please present to the [facility] tomorrow at 7 AM for a walk-in appointment to continue with medication management.  Please review list of outpatient resources for psychiatry and counseling. Please follow up with your primary care provider for all medical related needs.    Therapy: We recommend that patient participate in individual therapy to address mental health concerns.   Atypical antipsychotics: If you are prescribed an atypical antipsychotic, it is recommended that your height, weight, BMI, blood pressure, fasting lipid panel, and fasting blood sugar be monitored by your outpatient providers.  Safety:   The following safety precautions should be taken:   No sharp objects. This includes scissors, razors, scrapers, and putty knives.   Chemicals should be removed and locked up.   Medications should be removed and locked up.   Weapons should be removed and locked up. This includes firearms, knives and instruments that can be used to cause injury.   The patient should abstain from use of illicit substances/drugs and abuse of any medications.  If symptoms worsen or do not continue to improve or if the patient becomes actively suicidal or homicidal then it is  recommended that the patient return to the closest hospital emergency department, the [facility], or call 911 for further evaluation and treatment. National Suicide Prevention Lifeline 1-800-SUICIDE or (747)339-1137.  About 988 988 offers 24/7 access to trained crisis counselors who can help people experiencing mental health-related distress. People can call or text 988 or chat 988lifeline.org for themselves or if they are worried about a loved one who may need crisis support.   You are encouraged to follow up with [facility] for outpatient treatment.  Walk in/ Open Access Hours: Monday - Friday 8AM - 11AM (To see provider and therapist) Please arrive by 7 AM  [facility] (second floor) [address] [place], [place] [phone]

## 2023-09-06 NOTE — ED Provider Notes (Signed)
 Behavioral Health Urgent Care Medical Screening Exam  Patient Name: Carly Brooks MRN: [id number] Date of Evaluation: 09/06/23 Chief Complaint:  I need my medications.  Diagnosis:  Final diagnoses:  Medication refill  Schizoaffective disorder, unspecified type (HCC)    History of Present illness:Carly Brooks 28 y.o., female patient presented to [facility] as a voluntary walk in unaccompanied with complaints of feeling sad because she does not have her medications. Carly Brooks, is seen face to face by this provider and chart reviewed on 09/06/23.  Per chart review patient has a past psychiatric history of schizophrenia and depression. She was discharged from [facility] on 08/27/23 after receiving inpatient treatment.  She was discharged on fluoxetine  40 mg daily, olanzapine  10 mg twice daily and trazodone  50 mg nightly as needed for sleep.  Patient reports that her last Invega  LAI was on 5/20 /2025 through [facility].  No significant medical history.  Patient is is not currently following up with outpatient psychiatry.  On evaluation [name] reports that after she was discharged from [facility], she went to go pick up her medications and did not realize that they only gave her 2 of the prescriptions which included Prozac  and nicotine  patch but did not get the trazodone  50 mg or the olanzapine  10 mg because they were not ready yet.  She states that she has been taking her Prozac  every day and had some leftover trazodone  that she was taking to help her sleep from another hospital stay but she has not been taking the olanzapine  since hospitalization 10 days ago.  Patient reports that since she has not taken this medication she is feeling more sad and depressed and also having some ongoing chronic paranoia that somebody is going to break into the home.  She states that she is currently living with her children's father and that her kids are staying with her mother at  this time.  Patient denies having suicidal ideations, homicidal ideations and hallucinations at this time.  She reports sleeping well when she takes her trazodone  and has a good appetite.  This provider is somewhat familiar with this patient and her presentations to the behavioral health urgent care.  Patient does seem much more stable and is much more cooperative and engaged than her usual presentations.  Patient is requesting that we send her olanzapine  prescription to the pharmacy so that she could pick it up.  We did discuss the follow-up that was made by [facility] for patient to receive outpatient services through [facility] however, patient states that she missed this appointment and that they would not reschedule.  We discussed the option of patient following up with walk-in services through the open access at [facility], which patient agreed to do.   This provider reached out to the [facility] pharmacy which the olanzapine  10 mg was sent to at discharge and they were able to verify that patient did not pick up this medication and that it was there and ready to be picked up today.  I also attempted to call her children's father with whom she lives with however, he did not answer multiple times and has no voicemail set up.  I discussed with patient that she does have this medication available to her at the pharmacy right now and she requested taxi service be set up for transportation.  During evaluation Carly Brooks is sitting up at the table, in no acute distress. She is alert & oriented x  4, calm, cooperative and attentive for this assessment.  Her mood is somewhat sad with congruent flat affect.  She has normal speech, and behavior.  She does endorse having paranoia and feeling that somebody is going to break into her house at times.  This does appear to be chronic in nature with her schizophrenia diagnosis. Pt does not appear to be responding to internal or external  stimuli.  Patient is able to converse coherently, goal directed thoughts, no distractibility.  She also denies suicidal/self-harm/homicidal ideation, and hallucinations.  Patient answered assessment questions appropriately.    Flowsheet Row Admission (Discharged) from 08/16/2023 in [facility] INPATIENT BEHAV[facility]AL MEDICINE ED from 08/15/2023 in [facility] Emergency Department at [facility] ED from 07/28/2023 in [facility]  C-SSRS RISK CATEGORY Low Risk Low Risk Low Risk    Psychiatric Specialty Exam  Presentation  General Appearance:Casual  Eye Contact:Fair  Speech:Clear and Coherent  Speech Volume:Normal  Handedness:Right   Mood and Affect  Mood: Euthymic  Affect: Congruent   Thought Process  Thought Processes: Coherent  Descriptions of Associations:Intact  Orientation:Full (Time, Place and Person)  Thought Content:Paranoid Ideation  Diagnosis of Schizophrenia or Schizoaffective disorder in past: Yes  Duration of Psychotic Symptoms: Greater than six months  Hallucinations:None telling her to hurt self womans voice that I talk to - not scary seeing demons  Ideas of Reference:Paranoia  Suicidal Thoughts:No -- (Refused to disclose plan or intent) Without Intent; Without Plan  Homicidal Thoughts:No   Sensorium  Memory: Immediate Fair; Recent Fair  Judgment: Fair  Insight: Fair   Art therapist  Concentration: Good  Attention Span: Good  Recall: [employer] of Knowledge: Fair  Language: Good   Psychomotor Activity  Psychomotor Activity: Normal   Assets  Assets: Communication Skills; Desire for Improvement   Sleep  Sleep: Good  Number of hours:  -- (unable to assess)   Physical Exam: Physical Exam Vitals and nursing note reviewed.  Constitutional:      Appearance: She is obese.  HENT:     Head: Normocephalic.     Nose: Nose normal.   Eyes:     Extraocular Movements: Extraocular  movements intact.    Cardiovascular:     Rate and Rhythm: Normal rate.  Pulmonary:     Effort: Pulmonary effort is normal.   Musculoskeletal:        General: Normal range of motion.     Cervical back: Normal range of motion.   Neurological:     General: No focal deficit present.     Mental Status: She is alert and oriented to person, place, and time.    Review of Systems  Constitutional: Negative.   HENT: Negative.    Eyes: Negative.   Respiratory: Negative.    Cardiovascular: Negative.   Gastrointestinal: Negative.   Genitourinary: Negative.   Musculoskeletal: Negative.   Neurological: Negative.   Endo/Heme/Allergies: Negative.   Psychiatric/Behavioral:  Positive for depression.    Blood pressure 118/80, pulse 74, temperature 98.2 F (36.8 C), temperature source Oral, resp. rate 18, SpO2 100%, unknown if currently breastfeeding. There is no height or weight on file to calculate BMI.  Musculoskeletal: Strength & Muscle Tone: within normal limits Gait & Station: normal Patient leans: N/A   [facility] MSE Discharge Disposition for Follow up and Recommendations: Based on my evaluation the patient does not appear to have an emergency medical condition and can be discharged with resources and follow up care in outpatient services for Medication Management  and Individual Therapy Based on my assessment, patient appears to be stable and is not showing any concerning behaviors or reporting any safety concerns.  Patient was discharged with instructions to return to [facility] at 7 AM for walk-in services and to get set up with psychiatry for medication management.  Patient was also provided with other outpatient resources to follow-up with.  Patient already had medications ready for pickup at the [facility] pharmacy.  She is provided with education on how to administer this medication as well as the side effects.  Patient verbalized understanding.  Patient was  provided with a taxi for transportation back home.  Davia Erps, NP 09/06/2023, 3:40 PM

## 2023-09-06 NOTE — Discharge Summary (Signed)
 Carly Brooks to be discharged Home per NP order. An After Visit Summary was printed and given to the patient by provider. Patient escorted out and discharged home via taxi cab.  [name]  09/06/2023 3:22 PM

## 2023-09-06 NOTE — Progress Notes (Signed)
   09/06/23 1312  [facility] Triage Screening (Walk-ins at [facility] only)  How Did You Hear About Us ? Self  What Is the Reason for Your Visit/Call Today? Patient presents to the [facility] stating that he needs to get medications.  Patient states that she ran out of medications a couple days ago. Patient states that she was recently discharged from [facility] a couple weeks ago. She states that she currently does not have a mental health provider.  Patient could not identify the names of the medications she was prescribed at [facility], but did say Trazodone  was one of the medications.  She returned home to live with her baby's father. She states that her mother is keeping her children (ages 2-3) and he is gone to work and states that she gets very lonely at home.  Patient denies current SI, but states that she has three prior attempt.  Her last was approximately 3 weeks ago by OD. When asked if she is depressed, she states that she is just really sad. Patient denies HI, but states that she has visual hallucinations of cats and dogs.  Patient denies any SA use.  She states that she has not been sleeping well and has a decreased appetite.  Patient states that she feels like someone is out to her her and she does not feels safe.  She states that she feels like she needs to be in the hospital.  Patient is routine.  How Long Has This Been Causing You Problems? > than 6 months  Have You Recently Had Any Thoughts About Hurting Yourself? Yes  How long ago did you have thoughts about hurting yourself? 3 weeks ago  Are You Planning to Commit Suicide/Harm Yourself At This time? No  Have you Recently Had Thoughts About Hurting Someone Marigene Shoulder? No  Are You Planning To Harm Someone At This Time? No  Explanation: Denies SI/HI  Physical Abuse Denies  Verbal Abuse Denies  Sexual Abuse Denies  Exploitation of patient/patient's resources Denies  Self-Neglect Denies  Possible abuse reported to: Other (Comment) (NA)  Are you currently experiencing  any auditory, visual or other hallucinations? Yes  Please explain the hallucinations you are currently experiencing: sees cats and dogs  Have You Used Any Alcohol or Drugs in the Past 24 Hours? No  What Did You Use and How Much? none reported  Do you have any current medical co-morbidities that require immediate attention? No  Clinician description of patient physical appearance/behavior: Patient is clam and cooperative  What Do You Feel Would Help You the Most Today? Treatment for Depression or other mood problem  If access to [facility] was not available, would you have sought care in the Emergency Department? No  Determination of Need Routine (7 days)  Options For Referral Partial Hospitalization;BH Urgent Care

## 2024-02-04 ENCOUNTER — Telehealth: Payer: Self-pay | Admitting: *Deleted

## 2024-02-04 NOTE — Telephone Encounter (Signed)
 Pt overdue for visit Attempted to contact pt with an appt No answer, message left on recorder No further action needed at this time   Will await call back from pt

## 2024-05-28 ENCOUNTER — Ambulatory Visit
